# Patient Record
Sex: Female | Born: 1960 | Race: Black or African American | Hispanic: No | Smoking: Never smoker
Health system: Southern US, Community
[De-identification: ages and names within clinical notes are randomized; demographics above are authoritative.]

## PROBLEM LIST (undated history)

## (undated) DIAGNOSIS — J45909 Unspecified asthma, uncomplicated: Secondary | ICD-10-CM

## (undated) DIAGNOSIS — I5020 Unspecified systolic (congestive) heart failure: Secondary | ICD-10-CM

## (undated) DIAGNOSIS — F419 Anxiety disorder, unspecified: Secondary | ICD-10-CM

## (undated) DIAGNOSIS — E785 Hyperlipidemia, unspecified: Secondary | ICD-10-CM

## (undated) DIAGNOSIS — I5022 Chronic systolic (congestive) heart failure: Secondary | ICD-10-CM

## (undated) DIAGNOSIS — I255 Ischemic cardiomyopathy: Secondary | ICD-10-CM

## (undated) DIAGNOSIS — F410 Panic disorder [episodic paroxysmal anxiety] without agoraphobia: Secondary | ICD-10-CM

## (undated) DIAGNOSIS — I5042 Chronic combined systolic (congestive) and diastolic (congestive) heart failure: Secondary | ICD-10-CM

## (undated) DIAGNOSIS — R7303 Prediabetes: Secondary | ICD-10-CM

## (undated) DIAGNOSIS — F32A Depression, unspecified: Secondary | ICD-10-CM

## (undated) DIAGNOSIS — G5600 Carpal tunnel syndrome, unspecified upper limb: Secondary | ICD-10-CM

## (undated) DIAGNOSIS — I340 Nonrheumatic mitral (valve) insufficiency: Secondary | ICD-10-CM

## (undated) DIAGNOSIS — G4733 Obstructive sleep apnea (adult) (pediatric): Secondary | ICD-10-CM

## (undated) DIAGNOSIS — R739 Hyperglycemia, unspecified: Secondary | ICD-10-CM

## (undated) DIAGNOSIS — F329 Major depressive disorder, single episode, unspecified: Secondary | ICD-10-CM

## (undated) DIAGNOSIS — M1990 Unspecified osteoarthritis, unspecified site: Secondary | ICD-10-CM

## (undated) DIAGNOSIS — I10 Essential (primary) hypertension: Secondary | ICD-10-CM

## (undated) DIAGNOSIS — R0600 Dyspnea, unspecified: Secondary | ICD-10-CM

## (undated) DIAGNOSIS — J449 Chronic obstructive pulmonary disease, unspecified: Secondary | ICD-10-CM

## (undated) DIAGNOSIS — G4730 Sleep apnea, unspecified: Secondary | ICD-10-CM

## (undated) DIAGNOSIS — I2510 Atherosclerotic heart disease of native coronary artery without angina pectoris: Secondary | ICD-10-CM

## (undated) DIAGNOSIS — I491 Atrial premature depolarization: Secondary | ICD-10-CM

## (undated) HISTORY — DX: Atherosclerotic heart disease of native coronary artery without angina pectoris: I25.10

## (undated) HISTORY — DX: Ischemic cardiomyopathy: I25.5

## (undated) HISTORY — DX: Nonrheumatic mitral (valve) insufficiency: I34.0

## (undated) HISTORY — DX: Obstructive sleep apnea (adult) (pediatric): G47.33

## (undated) HISTORY — DX: Atrial premature depolarization: I49.1

## (undated) HISTORY — PX: TUBAL LIGATION: SHX77

## (undated) HISTORY — DX: Unspecified systolic (congestive) heart failure: I50.20

## (undated) HISTORY — DX: Prediabetes: R73.03

## (undated) HISTORY — DX: Essential (primary) hypertension: I10

## (undated) HISTORY — DX: Hyperlipidemia, unspecified: E78.5

## (undated) HISTORY — DX: Chronic systolic (congestive) heart failure: I50.22

## (undated) HISTORY — DX: Chronic combined systolic (congestive) and diastolic (congestive) heart failure: I50.42

---

## 2001-11-06 ENCOUNTER — Emergency Department (HOSPITAL_COMMUNITY): Admission: EM | Admit: 2001-11-06 | Discharge: 2001-11-06 | Payer: Self-pay | Admitting: *Deleted

## 2002-04-03 ENCOUNTER — Emergency Department (HOSPITAL_COMMUNITY): Admission: EM | Admit: 2002-04-03 | Discharge: 2002-04-03 | Payer: Self-pay | Admitting: Emergency Medicine

## 2003-09-23 ENCOUNTER — Emergency Department (HOSPITAL_COMMUNITY): Admission: EM | Admit: 2003-09-23 | Discharge: 2003-09-23 | Payer: Self-pay | Admitting: *Deleted

## 2004-02-22 ENCOUNTER — Emergency Department (HOSPITAL_COMMUNITY): Admission: EM | Admit: 2004-02-22 | Discharge: 2004-02-22 | Payer: Self-pay | Admitting: Emergency Medicine

## 2007-06-26 ENCOUNTER — Emergency Department (HOSPITAL_COMMUNITY): Admission: EM | Admit: 2007-06-26 | Discharge: 2007-06-26 | Payer: Self-pay | Admitting: Emergency Medicine

## 2009-06-17 ENCOUNTER — Emergency Department (HOSPITAL_COMMUNITY): Admission: EM | Admit: 2009-06-17 | Discharge: 2009-06-17 | Payer: Self-pay | Admitting: Emergency Medicine

## 2010-09-13 ENCOUNTER — Emergency Department (HOSPITAL_COMMUNITY): Admission: EM | Admit: 2010-09-13 | Discharge: 2010-09-13 | Payer: Self-pay | Admitting: Emergency Medicine

## 2011-02-21 LAB — DIFFERENTIAL
Basophils Absolute: 0.1 10*3/uL (ref 0.0–0.1)
Basophils Relative: 1 % (ref 0–1)
Eosinophils Absolute: 0 10*3/uL (ref 0.0–0.7)
Eosinophils Relative: 1 % (ref 0–5)
Lymphocytes Relative: 28 % (ref 12–46)
Lymphs Abs: 1.9 10*3/uL (ref 0.7–4.0)
Monocytes Absolute: 0.4 10*3/uL (ref 0.1–1.0)
Monocytes Relative: 6 % (ref 3–12)
Neutro Abs: 4.3 10*3/uL (ref 1.7–7.7)
Neutrophils Relative %: 64 % (ref 43–77)

## 2011-02-21 LAB — CBC
HCT: 42.1 % (ref 36.0–46.0)
Hemoglobin: 13.7 g/dL (ref 12.0–15.0)
MCH: 26.6 pg (ref 26.0–34.0)
MCHC: 32.5 g/dL (ref 30.0–36.0)
MCV: 81.9 fL (ref 78.0–100.0)
Platelets: 256 10*3/uL (ref 150–400)
RBC: 5.14 MIL/uL — ABNORMAL HIGH (ref 3.87–5.11)
RDW: 15.5 % (ref 11.5–15.5)
WBC: 6.8 10*3/uL (ref 4.0–10.5)

## 2011-02-21 LAB — BASIC METABOLIC PANEL
BUN: 10 mg/dL (ref 6–23)
CO2: 26 mEq/L (ref 19–32)
Calcium: 9 mg/dL (ref 8.4–10.5)
Chloride: 104 mEq/L (ref 96–112)
Creatinine, Ser: 0.78 mg/dL (ref 0.4–1.2)
GFR calc Af Amer: >60 mL/min (ref >60)
GFR calc non Af Amer: >60 mL/min (ref >60)
Glucose, Bld: 111 mg/dL — ABNORMAL HIGH (ref 70–99)
Potassium: 4 mEq/L (ref 3.5–5.1)
Sodium: 137 mEq/L (ref 135–145)

## 2011-02-21 LAB — POCT CARDIAC MARKERS
CKMB, poc: <1 ng/mL — ABNORMAL LOW (ref 1.0–8.0)
CKMB, poc: <1 ng/mL — ABNORMAL LOW (ref 1.0–8.0)
Myoglobin, poc: 32.2 ng/mL (ref 12–200)
Myoglobin, poc: 47.4 ng/mL (ref 12–200)
Troponin i, poc: <0.05 ng/mL (ref 0.00–0.09)
Troponin i, poc: <0.05 ng/mL (ref 0.00–0.09)

## 2011-02-21 LAB — T4, FREE: Free T4: 1.08 ng/dL (ref 0.80–1.80)

## 2011-02-21 LAB — TSH: TSH: 0.678 u[IU]/mL (ref 0.350–4.500)

## 2011-09-24 LAB — URINALYSIS, ROUTINE W REFLEX MICROSCOPIC
Glucose, UA: NEGATIVE
Hgb urine dipstick: NEGATIVE
Nitrite: NEGATIVE
Protein, ur: NEGATIVE
Urobilinogen, UA: 0.2

## 2011-09-24 LAB — PREGNANCY, URINE: Preg Test, Ur: NEGATIVE

## 2013-06-01 ENCOUNTER — Ambulatory Visit (HOSPITAL_COMMUNITY)
Admission: RE | Admit: 2013-06-01 | Discharge: 2013-06-01 | Disposition: A | Discharge status: Home / Self Care | Payer: Medicaid Other | Source: Ambulatory Visit | Attending: Family Medicine | Admitting: Family Medicine

## 2013-06-01 ENCOUNTER — Other Ambulatory Visit (HOSPITAL_COMMUNITY): Payer: Self-pay | Admitting: Family Medicine

## 2013-06-01 DIAGNOSIS — M25569 Pain in unspecified knee: Secondary | ICD-10-CM | POA: Insufficient documentation

## 2013-06-01 DIAGNOSIS — R52 Pain, unspecified: Secondary | ICD-10-CM

## 2013-06-10 ENCOUNTER — Ambulatory Visit (HOSPITAL_COMMUNITY)
Admission: RE | Admit: 2013-06-10 | Discharge: 2013-06-10 | Disposition: A | Discharge status: Home / Self Care | Payer: Medicaid Other | Source: Ambulatory Visit | Attending: Family Medicine | Admitting: Family Medicine

## 2013-06-10 ENCOUNTER — Other Ambulatory Visit (HOSPITAL_COMMUNITY): Payer: Self-pay | Admitting: Family Medicine

## 2013-06-10 DIAGNOSIS — R0602 Shortness of breath: Secondary | ICD-10-CM | POA: Insufficient documentation

## 2013-06-10 DIAGNOSIS — R609 Edema, unspecified: Secondary | ICD-10-CM

## 2013-06-10 DIAGNOSIS — M79609 Pain in unspecified limb: Secondary | ICD-10-CM | POA: Insufficient documentation

## 2013-07-02 ENCOUNTER — Other Ambulatory Visit (HOSPITAL_COMMUNITY): Payer: Self-pay | Admitting: Family Medicine

## 2013-07-02 ENCOUNTER — Ambulatory Visit (HOSPITAL_COMMUNITY)
Admission: RE | Admit: 2013-07-02 | Discharge: 2013-07-02 | Disposition: A | Discharge status: Home / Self Care | Payer: Disability Insurance | Source: Ambulatory Visit | Attending: Family Medicine | Admitting: Family Medicine

## 2013-07-02 DIAGNOSIS — M5137 Other intervertebral disc degeneration, lumbosacral region: Secondary | ICD-10-CM | POA: Insufficient documentation

## 2013-07-02 DIAGNOSIS — M25562 Pain in left knee: Secondary | ICD-10-CM

## 2013-07-02 DIAGNOSIS — M545 Low back pain, unspecified: Secondary | ICD-10-CM | POA: Insufficient documentation

## 2013-07-02 DIAGNOSIS — M51379 Other intervertebral disc degeneration, lumbosacral region without mention of lumbar back pain or lower extremity pain: Secondary | ICD-10-CM | POA: Insufficient documentation

## 2013-07-02 DIAGNOSIS — M25569 Pain in unspecified knee: Secondary | ICD-10-CM | POA: Insufficient documentation

## 2013-09-16 ENCOUNTER — Other Ambulatory Visit (HOSPITAL_COMMUNITY): Payer: Self-pay | Admitting: Family Medicine

## 2013-09-16 DIAGNOSIS — Z139 Encounter for screening, unspecified: Secondary | ICD-10-CM

## 2013-09-17 ENCOUNTER — Ambulatory Visit (HOSPITAL_COMMUNITY)
Admission: RE | Admit: 2013-09-17 | Discharge: 2013-09-17 | Disposition: A | Discharge status: Home / Self Care | Payer: Medicaid Other | Source: Ambulatory Visit | Attending: Family Medicine | Admitting: Family Medicine

## 2013-09-17 ENCOUNTER — Other Ambulatory Visit (HOSPITAL_COMMUNITY): Payer: Self-pay | Admitting: Family Medicine

## 2013-09-17 DIAGNOSIS — M1710 Unilateral primary osteoarthritis, unspecified knee: Secondary | ICD-10-CM

## 2013-09-17 DIAGNOSIS — M51379 Other intervertebral disc degeneration, lumbosacral region without mention of lumbar back pain or lower extremity pain: Secondary | ICD-10-CM | POA: Insufficient documentation

## 2013-09-17 DIAGNOSIS — M25569 Pain in unspecified knee: Secondary | ICD-10-CM | POA: Insufficient documentation

## 2013-09-17 DIAGNOSIS — M25562 Pain in left knee: Secondary | ICD-10-CM

## 2013-09-17 DIAGNOSIS — R0602 Shortness of breath: Secondary | ICD-10-CM

## 2013-09-17 DIAGNOSIS — M545 Low back pain: Secondary | ICD-10-CM

## 2013-09-17 DIAGNOSIS — M5137 Other intervertebral disc degeneration, lumbosacral region: Secondary | ICD-10-CM | POA: Insufficient documentation

## 2013-09-18 ENCOUNTER — Ambulatory Visit (HOSPITAL_COMMUNITY)
Admission: RE | Admit: 2013-09-18 | Discharge: 2013-09-18 | Disposition: A | Discharge status: Home / Self Care | Payer: Medicaid Other | Source: Ambulatory Visit | Attending: Family Medicine | Admitting: Family Medicine

## 2013-09-18 DIAGNOSIS — Z139 Encounter for screening, unspecified: Secondary | ICD-10-CM

## 2013-09-18 DIAGNOSIS — Z1231 Encounter for screening mammogram for malignant neoplasm of breast: Secondary | ICD-10-CM | POA: Insufficient documentation

## 2013-10-17 ENCOUNTER — Emergency Department (HOSPITAL_COMMUNITY)
Admission: EM | Admit: 2013-10-17 | Discharge: 2013-10-17 | Disposition: A | Discharge status: Home / Self Care | Payer: Medicaid Other | Attending: Emergency Medicine | Admitting: Emergency Medicine

## 2013-10-17 ENCOUNTER — Encounter (HOSPITAL_COMMUNITY): Payer: Self-pay | Admitting: Emergency Medicine

## 2013-10-17 DIAGNOSIS — Z79899 Other long term (current) drug therapy: Secondary | ICD-10-CM | POA: Insufficient documentation

## 2013-10-17 DIAGNOSIS — M129 Arthropathy, unspecified: Secondary | ICD-10-CM | POA: Insufficient documentation

## 2013-10-17 DIAGNOSIS — R220 Localized swelling, mass and lump, head: Secondary | ICD-10-CM

## 2013-10-17 HISTORY — DX: Unspecified osteoarthritis, unspecified site: M19.90

## 2013-10-17 LAB — PRO B NATRIURETIC PEPTIDE: Pro B Natriuretic peptide (BNP): 74.3 pg/mL (ref 0–125)

## 2013-10-17 LAB — HEPATIC FUNCTION PANEL
ALT: 10 U/L (ref 0–35)
Albumin: 3.9 g/dL (ref 3.5–5.2)
Bilirubin, Direct: <0.1 mg/dL (ref 0.0–0.3)
Total Bilirubin: 0.5 mg/dL (ref 0.3–1.2)

## 2013-10-17 LAB — CBC WITH DIFFERENTIAL/PLATELET
Basophils Absolute: 0 10*3/uL (ref 0.0–0.1)
Basophils Relative: 0 % (ref 0–1)
Eosinophils Absolute: 0.1 10*3/uL (ref 0.0–0.7)
HCT: 42.3 % (ref 36.0–46.0)
Hemoglobin: 13.7 g/dL (ref 12.0–15.0)
MCH: 26.8 pg (ref 26.0–34.0)
MCHC: 32.4 g/dL (ref 30.0–36.0)
MCV: 82.6 fL (ref 78.0–100.0)
Monocytes Absolute: 0.4 10*3/uL (ref 0.1–1.0)
Monocytes Relative: 5 % (ref 3–12)
Platelets: 286 10*3/uL (ref 150–400)

## 2013-10-17 LAB — BASIC METABOLIC PANEL
BUN: 13 mg/dL (ref 6–23)
CO2: 28 mEq/L (ref 19–32)
Creatinine, Ser: 0.8 mg/dL (ref 0.50–1.10)
GFR calc Af Amer: >90 mL/min (ref >90)
GFR calc non Af Amer: 85 mL/min — ABNORMAL LOW (ref >90)

## 2013-10-17 NOTE — ED Notes (Addendum)
Patient has had episodes of pre-syncope x 1 year.  Episodes come suddenly and last only 1-2 minutes, but she feels as if she will pass out.  Denies any association w/position changes.  Denies vertigo or any URIs.  Denies disequilibrium, HA, photosensitivity, vision or hearing changes.  States she feels pressure in her face.  Denies any nasal congestion or cough, but has slight tenderness in maxillary sinuses. Denies any renal, adrenal or thyroid problems.  She does have chronic neck pain issues.  She relates that people who've not seen her in a while comment on facial swelling, but she has not noticed it.  No gaze palsy, no nystagmus noted.  She takes 40mg  Lasix daily x 5 months and was started on Prozac 1 month ago.

## 2013-10-17 NOTE — ED Provider Notes (Signed)
CSN: [id number]     Arrival date & time 10/17/13  4098 History   First MD Initiated Contact with Patient 10/17/13 [phone]     Chief Complaint  Patient presents with  . Facial Pain   (Consider location/radiation/quality/duration/timing/severity/associated sxs/prior Treatment) HPI Comments: Patient is a 52 year old African American female who presents to the emergency department with complaint of facial swelling and occasional soreness. The patient states that over the past 2 weeks family members and church members have been telling her that her" face looks swollen". The patient states that she has a sensation that feels like spasm and at times pain of the scalp and the face that last only a few seconds and may recur from time to time. The patient denies any recent injury or breaking the skin to the scalp, or to the face. She denies any toothache. She denies any known sinus infection. She's not had fever or chills. The patient denies any known allergic type reaction. Patient denies any difficulty swallowing. Patient states she has not taken anything for the swelling, she has been seen by her primary physician who did blood work a few weeks ago, states that they did not see anything at the time.  The history is provided by the patient.    Past Medical History  Diagnosis Date  . Arthritis    Past Surgical History  Procedure Laterality Date  . Cesarean section     No family history on file. History  Substance Use Topics  . Smoking status: Never Smoker   . Smokeless tobacco: Not on file  . Alcohol Use: No   OB History   Grav Para Term Preterm Abortions TAB SAB Ect Mult Living                 Review of Systems  Constitutional: Negative for activity change.       All ROS Neg except as noted in HPI  HENT: Positive for facial swelling. Negative for nosebleeds.   Eyes: Negative for photophobia and discharge.  Respiratory: Negative for cough, shortness of breath and wheezing.    Cardiovascular: Negative for chest pain and palpitations.  Gastrointestinal: Negative for abdominal pain and blood in stool.  Genitourinary: Negative for dysuria, frequency and hematuria.  Musculoskeletal: Positive for arthralgias. Negative for back pain and neck pain.  Skin: Negative.   Neurological: Negative for dizziness, seizures and speech difficulty.  Psychiatric/Behavioral: Negative for hallucinations and confusion.    Allergies  Review of patient's allergies indicates no known allergies.  Home Medications   Current Outpatient Rx  Name  Route  Sig  Dispense  Refill  . FLUoxetine (PROZAC) 20 MG capsule   Oral   Take 20 mg by mouth daily.         . Multiple Vitamin (MULTIVITAMIN WITH MINERALS) TABS tablet   Oral   Take 1 tablet by mouth daily.         . naproxen (NAPROSYN) 500 MG tablet   Oral   Take 500 mg by mouth 2 (two) times daily as needed for mild pain.          BP 147/93  Pulse 77  LMP 09/30/2013 Physical Exam  Nursing note and vitals reviewed. Constitutional: She is oriented to person, place, and time. She appears well-developed and well-nourished.  Non-toxic appearance.  HENT:  Head: Normocephalic.  Right Ear: Tympanic membrane and external ear normal.  Left Ear: Tympanic membrane and external ear normal.  Mouth/Throat: Oropharynx is clear and moist.  No oral  abscess noted. No pain to percussion of the sinuses.  Eyes: EOM and lids are normal. Pupils are equal, round, and reactive to light.  Neck: Normal range of motion. Neck supple. Carotid bruit is not present.  Cardiovascular: Normal rate, regular rhythm, normal heart sounds, intact distal pulses and normal pulses.   Pulmonary/Chest: Breath sounds normal. No respiratory distress.  Abdominal: Soft. Bowel sounds are normal. There is no tenderness. There is no guarding.  Musculoskeletal:  There is puffiness of both hands. No hot joints. Mild crepitus of ROM of the wrist and knees. No effusions.   Lymphadenopathy:       Head (right side): No submandibular adenopathy present.       Head (left side): No submandibular adenopathy present.    She has no cervical adenopathy.  Neurological: She is alert and oriented to person, place, and time. She has normal strength. No cranial nerve deficit or sensory deficit.  Skin: Skin is warm and dry.  Psychiatric: She has a normal mood and affect. Her speech is normal.    ED Course  Procedures (including critical care time) Labs Review Labs Reviewed  CBC WITH DIFFERENTIAL - Abnormal; Notable for the following:    RBC 5.12 (*)    All other components within normal limits  BASIC METABOLIC PANEL - Abnormal; Notable for the following:    Glucose, Bld 101 (*)    GFR calc non Af Amer 85 (*)    All other components within normal limits  HEPATIC FUNCTION PANEL - Abnormal; Notable for the following:    Alkaline Phosphatase 127 (*)    All other components within normal limits  PRO B NATRIURETIC PEPTIDE  URINALYSIS, ROUTINE W REFLEX MICROSCOPIC   Imaging Review No results found.  EKG Interpretation   None       MDM  No diagnosis found. *I have reviewed nursing notes, vital signs, and all appropriate lab and imaging results for this patient.** B natruretic peptide 74.3, within normal limits. Complete blood count within normal limits. Basic metabolic panel within normal limits. Hepatic function within normal limits. Patient in no distress. Speaks in complete sentences without any problem whatsoever. There is some mild degenerative changes involving multiple joints and neck. Discussed findings with the patient. Suggest that she follow up with her primary physician for additional evaluation. Doubt any acute problem at this time, no rash, wheezes, nor oral swelling.  the basic metabolic, CBC, and hepatic function are all negative. Feel that it is safe for the patient to be discharged home with outpatient followup. Pt to return if any changes or  problem.  Kathie Dike, PA-C 10/17/13 1250

## 2013-10-17 NOTE — ED Provider Notes (Signed)
Medical screening examination/treatment/procedure(s) were performed by non-physician practitioner and as supervising physician I was immediately available for consultation/collaboration.  EKG Interpretation   None         Kristen N Ward, DO 10/17/13 1604 

## 2013-10-17 NOTE — ED Notes (Signed)
Pt states "soreness" in here head and facial pain. States people have been telling her that her face is swollen. NAD. States dizziness at times when soreness sensation begins in her head. Has seen PMD for this also 2 weeks ago.

## 2013-11-09 ENCOUNTER — Encounter (HOSPITAL_COMMUNITY): Payer: Self-pay | Admitting: Emergency Medicine

## 2013-11-09 DIAGNOSIS — R109 Unspecified abdominal pain: Secondary | ICD-10-CM | POA: Insufficient documentation

## 2013-11-09 DIAGNOSIS — M545 Low back pain, unspecified: Secondary | ICD-10-CM | POA: Insufficient documentation

## 2013-11-09 NOTE — ED Notes (Signed)
Pt c/o lower back and lower abd pain since Sat.

## 2013-11-10 ENCOUNTER — Emergency Department (HOSPITAL_COMMUNITY)
Admission: EM | Admit: 2013-11-10 | Discharge: 2013-11-10 | Discharge status: Against Medical Advice | Payer: Medicaid Other | Attending: Emergency Medicine | Admitting: Emergency Medicine

## 2013-11-10 NOTE — ED Notes (Signed)
Pt was seen leaving the facility in her car.

## 2013-12-10 ENCOUNTER — Emergency Department (HOSPITAL_COMMUNITY): Payer: Medicaid Other

## 2013-12-10 ENCOUNTER — Encounter (HOSPITAL_COMMUNITY): Payer: Self-pay | Admitting: Emergency Medicine

## 2013-12-10 ENCOUNTER — Other Ambulatory Visit: Payer: Self-pay

## 2013-12-10 ENCOUNTER — Observation Stay (HOSPITAL_COMMUNITY)
Admission: EM | Admit: 2013-12-10 | Discharge: 2013-12-12 | Disposition: A | Discharge status: Home / Self Care | Payer: Medicaid Other | Attending: Cardiovascular Disease | Admitting: Cardiovascular Disease

## 2013-12-10 DIAGNOSIS — R0989 Other specified symptoms and signs involving the circulatory and respiratory systems: Secondary | ICD-10-CM

## 2013-12-10 DIAGNOSIS — R002 Palpitations: Principal | ICD-10-CM

## 2013-12-10 DIAGNOSIS — Z8249 Family history of ischemic heart disease and other diseases of the circulatory system: Secondary | ICD-10-CM | POA: Insufficient documentation

## 2013-12-10 DIAGNOSIS — R42 Dizziness and giddiness: Secondary | ICD-10-CM | POA: Diagnosis present

## 2013-12-10 DIAGNOSIS — R0609 Other forms of dyspnea: Secondary | ICD-10-CM | POA: Insufficient documentation

## 2013-12-10 DIAGNOSIS — I10 Essential (primary) hypertension: Secondary | ICD-10-CM | POA: Insufficient documentation

## 2013-12-10 DIAGNOSIS — G4733 Obstructive sleep apnea (adult) (pediatric): Secondary | ICD-10-CM | POA: Insufficient documentation

## 2013-12-10 DIAGNOSIS — E789 Disorder of lipoprotein metabolism, unspecified: Secondary | ICD-10-CM | POA: Insufficient documentation

## 2013-12-10 DIAGNOSIS — R9431 Abnormal electrocardiogram [ECG] [EKG]: Secondary | ICD-10-CM

## 2013-12-10 DIAGNOSIS — M129 Arthropathy, unspecified: Secondary | ICD-10-CM | POA: Insufficient documentation

## 2013-12-10 DIAGNOSIS — R0600 Dyspnea, unspecified: Secondary | ICD-10-CM

## 2013-12-10 DIAGNOSIS — Z6841 Body Mass Index (BMI) 40.0 and over, adult: Secondary | ICD-10-CM | POA: Insufficient documentation

## 2013-12-10 DIAGNOSIS — R7309 Other abnormal glucose: Secondary | ICD-10-CM | POA: Insufficient documentation

## 2013-12-10 HISTORY — DX: Morbid (severe) obesity due to excess calories: E66.01

## 2013-12-10 HISTORY — DX: Depression, unspecified: F32.A

## 2013-12-10 HISTORY — DX: Hyperlipidemia, unspecified: E78.5

## 2013-12-10 HISTORY — DX: Hyperglycemia, unspecified: R73.9

## 2013-12-10 HISTORY — DX: Sleep apnea, unspecified: G47.30

## 2013-12-10 HISTORY — DX: Dyspnea, unspecified: R06.00

## 2013-12-10 HISTORY — DX: Major depressive disorder, single episode, unspecified: F32.9

## 2013-12-10 HISTORY — DX: Essential (primary) hypertension: I10

## 2013-12-10 LAB — CBC WITH DIFFERENTIAL/PLATELET
Basophils Absolute: 0 10*3/uL (ref 0.0–0.1)
Basophils Relative: 0 % (ref 0–1)
Eosinophils Absolute: 0.1 10*3/uL (ref 0.0–0.7)
Eosinophils Relative: 1 % (ref 0–5)
HCT: 42.9 % (ref 36.0–46.0)
Hemoglobin: 13.9 g/dL (ref 12.0–15.0)
LYMPHS ABS: 2.4 10*3/uL (ref 0.7–4.0)
Lymphocytes Relative: 29 % (ref 12–46)
MCH: 26.6 pg (ref 26.0–34.0)
MCHC: 32.4 g/dL (ref 30.0–36.0)
MCV: 82.2 fL (ref 78.0–100.0)
Monocytes Absolute: 0.4 10*3/uL (ref 0.1–1.0)
Monocytes Relative: 5 % (ref 3–12)
Neutro Abs: 5.4 10*3/uL (ref 1.7–7.7)
Neutrophils Relative %: 65 % (ref 43–77)
PLATELETS: 269 10*3/uL (ref 150–400)
RBC: 5.22 MIL/uL — AB (ref 3.87–5.11)
RDW: 14.9 % (ref 11.5–15.5)
WBC: 8.3 10*3/uL (ref 4.0–10.5)

## 2013-12-10 LAB — BASIC METABOLIC PANEL
BUN: 12 mg/dL (ref 6–23)
CALCIUM: 9.6 mg/dL (ref 8.4–10.5)
CO2: 27 meq/L (ref 19–32)
Chloride: 100 mEq/L (ref 96–112)
Creatinine, Ser: 1 mg/dL (ref 0.50–1.10)
GFR calc Af Amer: 75 mL/min — ABNORMAL LOW (ref >90)
GFR calc non Af Amer: 65 mL/min — ABNORMAL LOW (ref >90)
GLUCOSE: 118 mg/dL — AB (ref 70–99)
Potassium: 3.9 mEq/L (ref 3.7–5.3)
Sodium: 142 mEq/L (ref 137–147)

## 2013-12-10 LAB — TROPONIN I: Troponin I: <0.3 ng/mL (ref <0.30)

## 2013-12-10 LAB — D-DIMER, QUANTITATIVE: D-Dimer, Quant: 0.43 ug/mL-FEU (ref 0.00–0.48)

## 2013-12-10 MED ORDER — SODIUM CHLORIDE 0.9 % IV BOLUS (SEPSIS)
1000.0000 mL | Freq: Once | INTRAVENOUS | Status: AC
  Administered 2013-12-10: 1000 mL via INTRAVENOUS

## 2013-12-10 MED ORDER — ACETAMINOPHEN 325 MG PO TABS
650.0000 mg | ORAL_TABLET | Freq: Once | ORAL | Status: AC
  Administered 2013-12-10: 650 mg via ORAL
  Filled 2013-12-10: qty 2

## 2013-12-10 MED ORDER — ASPIRIN 81 MG PO CHEW
324.0000 mg | CHEWABLE_TABLET | Freq: Once | ORAL | Status: AC
  Administered 2013-12-10: 324 mg via ORAL
  Filled 2013-12-10: qty 4

## 2013-12-10 NOTE — ED Provider Notes (Signed)
CSN: [id number]     Arrival date & time 12/10/13  1806 History   First MD Initiated Contact with Patient 12/10/13 1814     Chief Complaint  Patient presents with  . Palpitations  . Shortness of Breath   (Consider location/radiation/quality/duration/timing/severity/associated sxs/prior Treatment) HPI Comments: 53 yo female with depression hx presents with palpitations and sob since cooking today. Pt has had brief episodes in the past but today she had sudden palpitations and sob, multiple episodes since 2 pm that are lasting longer, she feels like she is going to pass out.  No known heart or blood clot hx.  Patient denies blood clot history, active cancer, recent major trauma or surgery, unilateral leg swelling/ pain, recent long travel, hemoptysis or oral contraceptives.  Pt has mild sob at times with exertion.  No htn, chol, smoking or DM.  Improved since.  No arrhythmia hx.    Patient is a 53 y.o. female presenting with palpitations and shortness of breath. The history is provided by the patient.  Palpitations Associated symptoms: shortness of breath   Associated symptoms: no back pain, no chest pain, no diaphoresis and no vomiting   Shortness of Breath Associated symptoms: no abdominal pain, no chest pain, no diaphoresis, no fever, no headaches, no neck pain, no rash and no vomiting     Past Medical History  Diagnosis Date  . Arthritis   . Hypertension   . Depression    Past Surgical History  Procedure Laterality Date  . Cesarean section     History reviewed. No pertinent family history. History  Substance Use Topics  . Smoking status: Never Smoker   . Smokeless tobacco: Not on file  . Alcohol Use: No   OB History   Grav Para Term Preterm Abortions TAB SAB Ect Mult Living                 Review of Systems  Constitutional: Negative for fever, chills and diaphoresis.  HENT: Negative for congestion.   Eyes: Negative for visual disturbance.  Respiratory: Positive for  shortness of breath.   Cardiovascular: Positive for palpitations. Negative for chest pain and leg swelling.  Gastrointestinal: Negative for vomiting and abdominal pain.  Genitourinary: Negative for dysuria and flank pain.  Musculoskeletal: Negative for back pain, neck pain and neck stiffness.  Skin: Negative for rash.  Neurological: Positive for light-headedness. Negative for syncope and headaches.    Allergies  Review of patient's allergies indicates no known allergies.  Home Medications   Current Outpatient Rx  Name  Route  Sig  Dispense  Refill  . FLUoxetine (PROZAC) 20 MG capsule   Oral   Take 20 mg by mouth daily.         . furosemide (LASIX) 40 MG tablet   Oral   Take 40 mg by mouth daily.         . Multiple Vitamin (MULTIVITAMIN WITH MINERALS) TABS tablet   Oral   Take 1 tablet by mouth daily.         . naproxen (NAPROSYN) 500 MG tablet   Oral   Take 500 mg by mouth 2 (two) times daily as needed for mild pain.          BP 138/97  Pulse 101  Temp(Src) 98.1 F (36.7 C) (Oral)  Resp 26  Ht 5\' 8"  (1.727 m)  Wt 273 lb (123.832 kg)  BMI 41.52 kg/m2  SpO2 96%  LMP 11/26/2013 Physical Exam  Nursing note and vitals reviewed.  Constitutional: She is oriented to person, place, and time. She appears well-developed and well-nourished.  HENT:  Head: Normocephalic and atraumatic.  Eyes: Conjunctivae are normal. Right eye exhibits no discharge. Left eye exhibits no discharge.  Neck: Normal range of motion. Neck supple. No tracheal deviation present.  Cardiovascular: Regular rhythm.  Tachycardia present.   Pulmonary/Chest: Effort normal and breath sounds normal.  Abdominal: Soft. She exhibits no distension (obese). There is no tenderness. There is no guarding.  Musculoskeletal: She exhibits no edema and no tenderness.  Neurological: She is alert and oriented to person, place, and time.  Skin: Skin is warm. No rash noted.  Psychiatric: She has a normal mood and  affect.    ED Course  Procedures (including critical care time) Labs Review Labs Reviewed  BASIC METABOLIC PANEL - Abnormal; Notable for the following:    Glucose, Bld 118 (*)    GFR calc non Af Amer 65 (*)    GFR calc Af Amer 75 (*)    All other components within normal limits  CBC WITH DIFFERENTIAL - Abnormal; Notable for the following:    RBC 5.22 (*)    All other components within normal limits  TROPONIN I  D-DIMER, QUANTITATIVE   Imaging Review Dg Chest 2 View  12/10/2013   CLINICAL DATA:  Palpitations.  Shortness of breath.  EXAM: CHEST  2 VIEW  COMPARISON:  09/17/2013  FINDINGS: Heart size remains at the upper limits of normal. No evidence of congestive heart failure or pleural effusion. A nodular density is now seen in the left upper lobe in proximity to pain EKG lead. This could be artifactual although a pulmonary nodule cannot be excluded.  IMPRESSION: No acute findings.  Left upper lobe nodular density, which could potentially be related to an EKG lead in this region. Recommend repeat PA chest radiograph after moving this EKG lead.   Electronically Signed   By: Myles Rosenthal M.D.   On: 12/10/2013 19:45    EKG Interpretation   None     EKG not in muse  Date: 12/10/2013  Rate: 102  Rhythm: sinus tachycardia  QRS Axis: normal  Intervals: normal  ST/T Wave abnormalities: nonspecific T wave changes inf and lateral T wave inversions  Conduction Disutrbances:none  Narrative Interpretation:   Old EKG Reviewed: none available    MDM   1. Abnormal EKG   2. Dyspnea   3. Palpitations    Non toxic appearing. Palpitations with dyspnea. EKG multiple T wave inversions, no old. D dimer ordered as pt low risk PE.  Spoke with cardiology on call, Dr Cyndee Brightly accepted tele obs at [facility] Pt improved on recheck, repeat ekg ordered.   The patients results and plan were reviewed and discussed.   Any x-rays performed were personally reviewed by myself.   Differential  diagnosis were considered with the presenting HPI.  Diagnosis: above  EKG: reviewed  Admission/ observation were discussed with the admitting physician, patient and/or family and they are comfortable with the plan.      Enid Skeens, MD 12/10/13 2034

## 2013-12-10 NOTE — ED Notes (Signed)
Patient states "I was cooking and started feeling my heart racing, I got short of breath, and felt like I was going to pass out." Denies chest pain.

## 2013-12-11 ENCOUNTER — Observation Stay (HOSPITAL_COMMUNITY): Payer: Medicaid Other

## 2013-12-11 DIAGNOSIS — R0609 Other forms of dyspnea: Secondary | ICD-10-CM

## 2013-12-11 DIAGNOSIS — R42 Dizziness and giddiness: Secondary | ICD-10-CM | POA: Diagnosis present

## 2013-12-11 DIAGNOSIS — R079 Chest pain, unspecified: Secondary | ICD-10-CM

## 2013-12-11 DIAGNOSIS — R0989 Other specified symptoms and signs involving the circulatory and respiratory systems: Secondary | ICD-10-CM

## 2013-12-11 DIAGNOSIS — R002 Palpitations: Secondary | ICD-10-CM | POA: Diagnosis present

## 2013-12-11 DIAGNOSIS — R0600 Dyspnea, unspecified: Secondary | ICD-10-CM | POA: Diagnosis present

## 2013-12-11 LAB — TROPONIN I

## 2013-12-11 LAB — LIPID PANEL
CHOL/HDL RATIO: 6.8 ratio
CHOLESTEROL: 203 mg/dL — AB (ref 0–200)
HDL: 30 mg/dL — ABNORMAL LOW (ref >39)
LDL Cholesterol: 146 mg/dL — ABNORMAL HIGH (ref 0–99)
TRIGLYCERIDES: 133 mg/dL (ref <150)
VLDL: 27 mg/dL (ref 0–40)

## 2013-12-11 LAB — TSH: TSH: 0.675 u[IU]/mL (ref 0.350–4.500)

## 2013-12-11 LAB — PRO B NATRIURETIC PEPTIDE: Pro B Natriuretic peptide (BNP): 24.5 pg/mL (ref 0–125)

## 2013-12-11 LAB — HEMOGLOBIN A1C
Hgb A1c MFr Bld: 6 % — ABNORMAL HIGH (ref <5.7)
Mean Plasma Glucose: 126 mg/dL — ABNORMAL HIGH (ref <117)

## 2013-12-11 MED ORDER — GI COCKTAIL ~~LOC~~
30.0000 mL | Freq: Four times a day (QID) | ORAL | Status: DC | PRN
  Filled 2013-12-11: qty 30

## 2013-12-11 MED ORDER — POTASSIUM CHLORIDE ER 10 MEQ PO TBCR
40.0000 meq | EXTENDED_RELEASE_TABLET | Freq: Once | ORAL | Status: AC
  Administered 2013-12-11: 40 meq via ORAL
  Filled 2013-12-11: qty 4

## 2013-12-11 MED ORDER — FLUOXETINE HCL 20 MG PO CAPS
20.0000 mg | ORAL_CAPSULE | Freq: Every day | ORAL | Status: DC
  Administered 2013-12-11 – 2013-12-12 (×2): 20 mg via ORAL
  Filled 2013-12-11 (×2): qty 1

## 2013-12-11 MED ORDER — MAGNESIUM OXIDE 400 (241.3 MG) MG PO TABS
800.0000 mg | ORAL_TABLET | Freq: Once | ORAL | Status: AC
  Administered 2013-12-11: 800 mg via ORAL
  Filled 2013-12-11: qty 2

## 2013-12-11 MED ORDER — HYPROMELLOSE (GONIOSCOPIC) 2.5 % OP SOLN: 1.0000 [drp] | Freq: Every day | OPHTHALMIC | Status: DC | PRN

## 2013-12-11 MED ORDER — REGADENOSON 0.4 MG/5ML IV SOLN
INTRAVENOUS | Status: AC
  Administered 2013-12-11: 0.4 mg via INTRAVENOUS
  Filled 2013-12-11: qty 5

## 2013-12-11 MED ORDER — REGADENOSON 0.4 MG/5ML IV SOLN
0.4000 mg | Freq: Once | INTRAVENOUS | Status: AC
  Administered 2013-12-11: 0.4 mg via INTRAVENOUS
  Filled 2013-12-11: qty 5

## 2013-12-11 MED ORDER — FUROSEMIDE 10 MG/ML IJ SOLN: 20.0000 mg | Freq: Once | INTRAMUSCULAR | Status: DC

## 2013-12-11 MED ORDER — ENOXAPARIN SODIUM 40 MG/0.4ML ~~LOC~~ SOLN
40.0000 mg | Freq: Every day | SUBCUTANEOUS | Status: DC
  Filled 2013-12-11 (×2): qty 0.4

## 2013-12-11 MED ORDER — FUROSEMIDE 10 MG/ML IJ SOLN
40.0000 mg | Freq: Once | INTRAMUSCULAR | Status: AC
  Administered 2013-12-11: 40 mg via INTRAVENOUS

## 2013-12-11 MED ORDER — TECHNETIUM TC 99M SESTAMIBI - CARDIOLITE
30.0000 | Freq: Once | INTRAVENOUS | Status: AC | PRN
  Administered 2013-12-11: 30 via INTRAVENOUS

## 2013-12-11 MED ORDER — ACETAMINOPHEN 325 MG PO TABS: 650.0000 mg | ORAL_TABLET | ORAL | Status: DC | PRN

## 2013-12-11 MED ORDER — TECHNETIUM TC 99M SESTAMIBI GENERIC - CARDIOLITE: 10.0000 | Freq: Once | INTRAVENOUS | Status: AC | PRN

## 2013-12-11 MED ORDER — ASPIRIN EC 81 MG PO TBEC
81.0000 mg | DELAYED_RELEASE_TABLET | Freq: Every day | ORAL | Status: DC
  Administered 2013-12-12: 81 mg via ORAL
  Filled 2013-12-11 (×2): qty 1

## 2013-12-11 MED ORDER — FUROSEMIDE 10 MG/ML IJ SOLN
INTRAMUSCULAR | Status: AC
  Filled 2013-12-11: qty 4

## 2013-12-11 MED ORDER — ONDANSETRON HCL 4 MG/2ML IJ SOLN: 4.0000 mg | Freq: Four times a day (QID) | INTRAMUSCULAR | Status: DC | PRN

## 2013-12-11 NOTE — Progress Notes (Signed)
Patient ID: Annette Bryant, female   DOB: 1961/05/31, 53 y.o.   MRN: [id number]    Subjective:  Denies SSCP, palpitations or Dyspnea   Objective:  Filed Vitals:   12/10/13 2000 12/10/13 2100 12/10/13 2256 12/11/13 0415  BP: 131/82 146/83 145/92 124/72  Pulse: 72 79 83 75  Temp:   98 F (36.7 C) 97.7 F (36.5 C)  TempSrc:   Oral Oral  Resp: 17 22 20 19   Height:   5\' 8"  (1.727 m)   Weight:   273 lb 5.9 oz (124 kg)   SpO2: 96% 96% 96% 93%    Intake/Output from previous day:  Intake/Output Summary (Last 24 hours) at 12/11/13 0827 Last data filed at 12/11/13 0415  Gross per 24 hour  Intake      0 ml  Output   1600 ml  Net  -1600 ml    Physical Exam: Affect appropriate Obese black female  HEENT: normal Neck supple with no adenopathy JVP normal no bruits no thyromegaly Lungs clear with no wheezing and good diaphragmatic motion Heart:  S1/S2 no murmur, no rub, gallop or click PMI normal Abdomen: benighn, BS positve, no tenderness, no AAA no bruit.  No HSM or HJR Distal pulses intact with no bruits No edema Neuro non-focal Skin warm and dry No muscular weakness   Lab Results: Basic Metabolic Panel:  Recent Labs  46/96/2899/12/24 1832  NA 142  K 3.9  CL 100  CO2 27  GLUCOSE 118*  BUN 12  CREATININE 1.00  CALCIUM 9.6   CBC:  Recent Labs  12/10/13 1832  WBC 8.3  NEUTROABS 5.4  HGB 13.9  HCT 42.9  MCV 82.2  PLT 269   Cardiac Enzymes:  Recent Labs  12/10/13 1832 12/11/13 0200  TROPONINI <0.30 <0.30   D-Dimer:  Recent Labs  12/10/13 1832  DDIMER 0.43   Fasting Lipid Panel:  Recent Labs  12/11/13 0200  CHOL 203*  HDL 30*  LDLCALC 146*  TRIG 133  CHOLHDL 6.8    Imaging: Dg Chest 2 View  12/10/2013   CLINICAL DATA:  Palpitations.  Shortness of breath.  EXAM: CHEST  2 VIEW  COMPARISON:  09/17/2013  FINDINGS: Heart size remains at the upper limits of normal. No evidence of congestive heart failure or pleural effusion. A nodular density is  now seen in the left upper lobe in proximity to pain EKG lead. This could be artifactual although a pulmonary nodule cannot be excluded.  IMPRESSION: No acute findings.  Left upper lobe nodular density, which could potentially be related to an EKG lead in this region. Recommend repeat PA chest radiograph after moving this EKG lead.   Electronically Signed   By: Annette Bryant  Stahl M.D.   On: 12/10/2013 19:45   Dg Chest Port 1 View  12/10/2013   CLINICAL DATA:  Chest pain and dizziness. Repeat study following removal of ECG lead to determine if there is a left upper lobe nodule present.  EXAM: PORTABLE CHEST - 1 VIEW  COMPARISON:  Chest x-ray 12/10/2013.  FINDINGS: Previously noted nodular opacity projecting over the upper left hemithorax is no longer identified following removal of the ECG leads. Pulmonary venous congestion, without frank pulmonary edema. No consolidative airspace disease. No pleural effusions. Heart size is within normal limits. The patient is rotated to the left on today's exam, resulting in distortion of the mediastinal contours and reduced diagnostic sensitivity and specificity for mediastinal pathology.  IMPRESSION: 1. No pulmonary nodule identified. The finding  on the prior study was presumably artifactual secondary to the overlying ECG leads. 2. Pulmonary venous congestion, without frank pulmonary edema.   Electronically Signed   By: Annette Bryant M.D.   On: 12/10/2013 21:17    Cardiac Studies:  ECG: SR rate 68 nonspecific St/T wave change s   Telemetry:  NSR no arrhythmia   Echo: pending   Medications:   . aspirin EC  81 mg Oral Daily  . enoxaparin (LOVENOX) injection  40 mg Subcutaneous Daily  . FLUoxetine  20 mg Oral Daily  . furosemide           Assessment/Plan:  Palpitations:  Telemetry has been unrevealing  Normal intervals on ECG continue to monitor Dyspnea:  CXR with ? Venous congestion BNP normal and D dimer negative  ? Functional from obesity.  Echo today Chest  Pain Doubt acute cardiac syndrome  ASA  2 day myovue ordered by fellow.  Will have stress portion today  Anticipate d/c tomorrow if tests ok   [name] 12/11/2013, 8:27 AM

## 2013-12-11 NOTE — Progress Notes (Signed)
Echocardiogram 2D Echocardiogram has been performed.  12/11/2013 1:57 PM Gertie FeyMichelle Lashaya Kienitz, RVT, RDCS, RDMS

## 2013-12-11 NOTE — H&P (Signed)
PCP:   Milana Obey, MD   Chief Complaint:  Palpitations, dyspnea, dizziness  HPI: This 53 year old African American morbidly obese female with past medical history of borderline hypertension as well as. He strong family history of early cardiovascular disease (sister with MI at age of 45 and the other sister with coronary stents at the age of 28) who presented with palpitations, dizziness, dyspnea. Patient reported that she had episodes of palpitations associated with shortness of breath sensation that point is [place] to her head for the last year or so. Usually happens after she perform some activities like cleaning the house. It usually takes her about 2-3 minutes before she gets sensation of heart racing and dyspnea, then it goes away after a few minutes of rest. On the day of presentation patient was making the bed and developed palpitations that was associate with shortness of breath. The plus about 5 minutes. Patient laid down and rested for about 2 hours.  She then went to the kitchen to prepare food and developed another episode of heart racing was associate with shortness of breath and significant dizziness while she was doing this. Patient reported that this episode lasted for about 5 minutes as well and resolved after she stopped her activity. She reported that she was dizzy to the point which was about to pass out. She had one more episode of significant dizziness at the time of presentation to the emergency department.  Patient denied active symptoms of chest pain, PND orthopnea, nausea vomiting, chills or fevers, bowel or bladder disturbances, bleeding problems. Patient reported that her blood pressure in the high occasionally usually it runs around 140/90 when she checks it with her primary care physician, she was prescribed Lasix in July 2014 which she takes intermittently for lower extremity edema which is primarily on her left leg. Patient admitted frequent urination during the  night up to 6 times, poor sleep, she feels tired all the time. She reported that somebody noticed her stop breathing while sleeping about 6 months ago  Review of Systems:  12 systems were reviewed and were negative except mentioned in the history of present illness.  Past Medical History: Past Medical History  Diagnosis Date  . Arthritis   . Hypertension   . Depression   . Sleep apnea     by clinical history   Past Surgical History  Procedure Laterality Date  . Cesarean section      Medications: Prior to Admission medications   Medication Sig Start Date End Date Taking? Authorizing Provider  FLUoxetine (PROZAC) 20 MG capsule Take 20 mg by mouth daily.   Yes Historical Provider, MD  furosemide (LASIX) 40 MG tablet Take 40 mg by mouth daily.   Yes Historical Provider, MD  hydroxypropyl methylcellulose (ISOPTO TEARS) 2.5 % ophthalmic solution Place 1 drop into both eyes daily as needed for dry eyes.   Yes Historical Provider, MD  naproxen (NAPROSYN) 500 MG tablet Take 500 mg by mouth 2 (two) times daily as needed for mild pain.   Yes Historical Provider, MD    Allergies:  No Known Allergies  Social History:  reports that she has never smoked. She does not have any smokeless tobacco history on file. She reports that she does not drink alcohol or use illicit drugs.   Family History: Family History  Problem Relation Age of Onset  . Coronary artery disease Sister 40    stents  . Coronary artery disease Sister   . Heart attack Sister 4  CHF, MI    PHYSICAL EXAM:  Filed Vitals:   12/10/13 1900 12/10/13 2000 12/10/13 2100 12/10/13 2256  BP: 125/81 131/82 146/83 145/92  Pulse: 79 72 79 83  Temp:    98 F (36.7 C)  TempSrc:    Oral  Resp: 21 17 22 20   Height:    5\' 8"  (1.727 m)  Weight:    124 kg (273 lb 5.9 oz)  SpO2: 93% 96% 96% 96%   General:  Well appearing. Morbidly obese, No respiratory difficulty HEENT: normal Neck: supple. JVP just above the level of the  clavicle with patient seated at 90. Carotids 2+ bilat; no bruits. No lymphadenopathy or thryomegaly appreciated. Cor: PMI nondisplaced. Regular rate & rhythm. Soft systolic murmur, best heard at the apex Lungs: clear to auscultation bilaterally, no wheezings, crackles Abdomen: soft, nontender, nondistended. No hepatosplenomegaly. No bruits or masses. Good bowel sounds. Extremities: no cyanosis, clubbing, rash, edema Neuro: alert & oriented x 3, cranial nerves grossly intact. moves all 4 extremities w/o difficulty. Affect pleasant.  Labs on Admission:   Recent Labs  12/10/13 1832  NA 142  K 3.9  CL 100  CO2 27  GLUCOSE 118*  BUN 12  CREATININE 1.00  CALCIUM 9.6   No results found for this basename: AST, ALT, ALKPHOS, BILITOT, PROT, ALBUMIN,  in the last 72 hours No results found for this basename: LIPASE, AMYLASE,  in the last 72 hours  Recent Labs  12/10/13 1832  WBC 8.3  NEUTROABS 5.4  HGB 13.9  HCT 42.9  MCV 82.2  PLT 269    Recent Labs  12/10/13 1832  TROPONINI <0.30   No results found for this basename: TSH, T4TOTAL, FREET3, T3FREE, THYROIDAB,  in the last 72 hours No results found for this basename: VITAMINB12, FOLATE, FERRITIN, TIBC, IRON, RETICCTPCT,  in the last 72 hours  Radiological Exams on Admission (all images were personally reviewed and interpreted by me, radiology reports were reviewed as well): Dg Chest 2 View  12/10/2013   CLINICAL DATA:  Palpitations.  Shortness of breath.  EXAM: CHEST  2 VIEW  COMPARISON:  09/17/2013  FINDINGS: Heart size remains at the upper limits of normal. No evidence of congestive heart failure or pleural effusion. A nodular density is now seen in the left upper lobe in proximity to pain EKG lead. This could be artifactual although a pulmonary nodule cannot be excluded.  IMPRESSION: No acute findings.  Left upper lobe nodular density, which could potentially be related to an EKG lead in this region. Recommend repeat PA chest  radiograph after moving this EKG lead.   Electronically Signed   By: Myles RosenthalJohn  Stahl M.D.   On: 12/10/2013 19:45   Dg Chest Port 1 View 12/10/2013   CLINICAL DATA:  Chest pain and dizziness. Repeat study following removal of ECG lead to determine if there is a left upper lobe nodule present.  EXAM: PORTABLE CHEST - 1 VIEW  COMPARISON:  Chest x-ray 12/10/2013.  FINDINGS: Previously noted nodular opacity projecting over the upper left hemithorax is no longer identified following removal of the ECG leads. Pulmonary venous congestion, without frank pulmonary edema. No consolidative airspace disease. No pleural effusions. Heart size is within normal limits. The patient is rotated to the left on today's exam, resulting in distortion of the mediastinal contours and reduced diagnostic sensitivity and specificity for mediastinal pathology.  IMPRESSION: 1. No pulmonary nodule identified. The finding on the prior study was presumably artifactual secondary to the overlying ECG  leads. 2. Pulmonary venous congestion, without frank pulmonary edema.   Electronically Signed   By: Trudie Reed M.D.   On: 12/10/2013 21:17   EKG personally reviewed and interpreted by me: Sinus tachycardia 102 beats per minute, occasional PACs, T wave inversions in II, III, aVF, V3-V6    Assessment/Plan  53 year old [place] American female with strong family history of premature coronary artery disease, morbid obesity, borderline hypertension who presented with 2 episodes of palpitations associated with shortness of breath and dizziness.  Given the exertional nature of her symptoms there has been going on for about a year and strong family history it is reasonable to proceed with ischemic evaluation. Given the presence of pulmonary venous congestion, intermittent lower extremity edema, T wave inversions on electrocardiogram we'll obtain echocardiogram.   Problems: Dyspnea on exertion Palpitations Dizziness  Plan: TSH, proBNP, 2 sets of  troponin, lipid profile, hemoglobin A1c Observe on telemetry Aspirin 81 mg daily Lasix 40 mg IV x1, potassium 20 mEq by mouth x1, magnesium oxide 800 mg times one by mouth. Strict ins and outs, daily weights. Echocardiogram likely with contrast given body habitus Myocardial SPECT - for ischemic evaluation, ideally patient will need attenuation correction with CT. The order can be changed in the morning     GI prophylaxis: Not Required DVT prophylaxis Lovenox 40 mg [place] daily Full code  Annette Bryant 12/11/2013, 12:04 AM

## 2013-12-12 ENCOUNTER — Encounter (HOSPITAL_COMMUNITY): Payer: Self-pay | Admitting: Physician Assistant

## 2013-12-12 DIAGNOSIS — E785 Hyperlipidemia, unspecified: Secondary | ICD-10-CM | POA: Insufficient documentation

## 2013-12-12 DIAGNOSIS — R0609 Other forms of dyspnea: Secondary | ICD-10-CM

## 2013-12-12 DIAGNOSIS — F32A Depression, unspecified: Secondary | ICD-10-CM | POA: Insufficient documentation

## 2013-12-12 DIAGNOSIS — Z9989 Dependence on other enabling machines and devices: Secondary | ICD-10-CM | POA: Insufficient documentation

## 2013-12-12 DIAGNOSIS — G4730 Sleep apnea, unspecified: Secondary | ICD-10-CM | POA: Insufficient documentation

## 2013-12-12 DIAGNOSIS — I10 Essential (primary) hypertension: Secondary | ICD-10-CM | POA: Insufficient documentation

## 2013-12-12 DIAGNOSIS — R739 Hyperglycemia, unspecified: Secondary | ICD-10-CM | POA: Insufficient documentation

## 2013-12-12 DIAGNOSIS — R002 Palpitations: Principal | ICD-10-CM

## 2013-12-12 DIAGNOSIS — G4733 Obstructive sleep apnea (adult) (pediatric): Secondary | ICD-10-CM | POA: Insufficient documentation

## 2013-12-12 DIAGNOSIS — F329 Major depressive disorder, single episode, unspecified: Secondary | ICD-10-CM | POA: Insufficient documentation

## 2013-12-12 DIAGNOSIS — R0989 Other specified symptoms and signs involving the circulatory and respiratory systems: Secondary | ICD-10-CM

## 2013-12-12 NOTE — Discharge Summary (Signed)
Discharge Summary   Patient ID: Annette Bryant MRN: [id number], DOB/AGE: 51-Apr-1962 51 y.o. Admit date: 12/10/2013 D/C date:     12/12/2013  Primary Care Provider: Milana Obey, MD Primary Cardiologist: Eden Emms  Primary Discharge Diagnoses:  1. Dyspnea with palpitations - ruled out for MI, d-dimer normal, normal stress test, normal echo, no evidence of inpatient arrhythmia - consider outpatient event monitor if symptoms recur 2. HTN 3. OSA 4. Morbid obesity Body mass index is 41.37 kg/(m^2). 5. Hyperglycemia 6. Elevated lipids  Secondary Discharge Diagnoses:  1. Arthritis 2. Depression  Hospital Course: Annette Bryant is a 53 y/o F with history of borderline HTN, OSA, morbid obesity, and family history of CAD who presented to [facility] with palpitations, dizziness, and dyspnea. She has had episodes of palpitations associated with SOB for the last year or two. Usually it happens after she perform some activities like cleaning the house. It usually takes her about 2-3 minutes before she gets sensation of heart racing and dyspnea, then it goes away after a few minutes of rest. On the day of presentation, the patient was making the bed and developed palpitations and shortness of breath. This lasted about 5 minutes. She laid down and rested for about 2 hours. She then went to the kitchen and developed another episode of heart racing, SOB, and significant dizziness while preparing food. She denied active symptoms of chest pain, PND, orthopnea, nausea, vomiting, chills or fevers, bowel or bladder disturbances, or bleeding problems. She was admitted to the hospital for further evaluation. EKG showed sinus tachycardia 102 beats per minute, occasional PACs, T wave inversions in II, III, aVF, V3-V6. She was initially treated with a dose of IV Lasix due to pulmonary venous congestion. pBNP later returned normal. It was felt perhaps the x-ray finding was functional from her obesity. (Of note -  first CXR described a possible LUL nodular density later deemed to be artifact by followup CXR that did not show said nodule.) Troponins remained negative, BMET and CBC were grossly normal except CBG 126, and pBNP/d-dimer were normal. TSH was normal. Telemetry was unrevealing. She underwent a stress-images-only Lexiscan cardiolite that was normal with EF 60%. 2D Echo was of poor quality but showed normal EF, no RWMA, and normal diastolic function parameters. Today the patient feels well. Dr. Diona Browner recommends f/u with her primary care provider. If she continues to manifest symptoms, consideration could be given to a longer-term outpatient cardiac monitor to exclude arrhythmias. She was also advised to f/u PCP for management of her mildly elevated blood sugar and cholesterol results. No new medications were added this admission. Dr. Diona Browner has seen and examined the patient today and feels she is stable for discharge.   Discharge Vitals: Blood pressure 120/75, pulse 79, temperature 98.1 F (36.7 C), temperature source Oral, resp. rate 19, height 5\' 8"  (1.727 m), weight 272 lb 0.8 oz (123.4 kg), last menstrual period 11/26/2013, SpO2 97.00%.  Labs: Lab Results  Component Value Date   WBC 8.3 12/10/2013   HGB 13.9 12/10/2013   HCT 42.9 12/10/2013   MCV 82.2 12/10/2013   PLT 269 12/10/2013     Recent Labs Lab 12/10/13 1832  NA 142  K 3.9  CL 100  CO2 27  BUN 12  CREATININE 1.00  CALCIUM 9.6  GLUCOSE 118*    Recent Labs  12/10/13 1832 12/11/13 0200  TROPONINI <0.30 <0.30   Lab Results  Component Value Date   CHOL 203* 12/11/2013   HDL  30* 12/11/2013   LDLCALC 146* 12/11/2013   TRIG 133 12/11/2013   Lab Results  Component Value Date   DDIMER 0.43 12/10/2013    Diagnostic Studies/Procedures   Dg Chest 2 View  12/10/2013   CLINICAL DATA:  Palpitations.  Shortness of breath.  EXAM: CHEST  2 VIEW  COMPARISON:  09/17/2013  FINDINGS: Heart size remains at the upper limits of normal. No evidence  of congestive heart failure or pleural effusion. A nodular density is now seen in the left upper lobe in proximity to pain EKG lead. This could be artifactual although a pulmonary nodule cannot be excluded.  IMPRESSION: No acute findings.  Left upper lobe nodular density, which could potentially be related to an EKG lead in this region. Recommend repeat PA chest radiograph after moving this EKG lead.   Electronically Signed   By: Myles Rosenthal M.D.   On: 12/10/2013 19:45   Nm Myocar Single W/spect W/wall Motion And Ef  12/11/2013   CLINICAL DATA:  Chest pain  EXAM: Lexiscan Myovue  TECHNIQUE: The patient received IV Lexiscan .4mg  over 15 seconds. 33.0 mCi of Technetium 61m Sestamibi injected at 30 seconds. Quantitative SPECT images were obtained in the vertical, horizontal and short axis planes No resting images were needed  ECG:  SR nonspecific ST/T wave changes  Symptoms:  No chest pain or dyspnea  RAW Data:  Motion artifact and breast  QPS:  No resting images  Quantitative Gated SPECT EF:  60% normal no RWMA's  Perfusion Images: Stress only images were normal. There was mild attenuation of the distal anterior apex that disappeared on gray scale images and were most consistent with breast artifact  IMPRESSION: Normal stress only lexiscan myovue. EF 60% No rest images will be needed  [name]   Electronically Signed   By: [name] M.D.   On: 12/11/2013 13:52   Dg Chest Port 1 View  12/10/2013   CLINICAL DATA:  Chest pain and dizziness. Repeat study following removal of ECG lead to determine if there is a left upper lobe nodule present.  EXAM: PORTABLE CHEST - 1 VIEW  COMPARISON:  Chest x-ray 12/10/2013.  FINDINGS: Previously noted nodular opacity projecting over the upper left hemithorax is no longer identified following removal of the ECG leads. Pulmonary venous congestion, without frank pulmonary edema. No consolidative airspace disease. No pleural effusions. Heart size is within normal limits. The  patient is rotated to the left on today's exam, resulting in distortion of the mediastinal contours and reduced diagnostic sensitivity and specificity for mediastinal pathology.  IMPRESSION: 1. No pulmonary nodule identified. The finding on the prior study was presumably artifactual secondary to the overlying ECG leads. 2. Pulmonary venous congestion, without frank pulmonary edema.   Electronically Signed   By: Trudie Reed M.D.   On: 12/10/2013 21:17    Discharge Medications     Medication List         FLUoxetine 20 MG capsule  Commonly known as:  PROZAC  Take 20 mg by mouth daily.     furosemide 40 MG tablet  Commonly known as:  LASIX  Take 40 mg by mouth daily.     hydroxypropyl methylcellulose 2.5 % ophthalmic solution  Commonly known as:  ISOPTO TEARS  Place 1 drop into both eyes daily as needed for dry eyes.     naproxen 500 MG tablet  Commonly known as:  NAPROSYN  Take 500 mg by mouth 2 (two) times daily as needed for  mild pain.        Disposition   The patient will be discharged in stable condition to home. Discharge Orders   Future Orders Complete By Expires   Diet - low sodium heart healthy  As directed    Discharge instructions  As directed    Comments:     Your blood sugar and cholesterol were somewhat elevated. Please follow up with your primary care doctor for this.   Increase activity slowly  As directed    Comments:     You may return to work on 12/14/13 if you are feeling well.     Follow-up Information   Schedule an appointment as soon as possible for a visit with Milana ObeyKNOWLTON,STEPHEN D, MD. (If your heart palpitations happen again, your doctor may want to consider an outpatient heart monitor.)    Specialty:  Family Medicine   Contact information:   [address] [place] [address] [phone]       Follow up with Charlton HawsPeter Nishan, MD. (You don't necessarily need to follow up with a heart doctor for now, but we are available if you  need us.)    Specialty:  Cardiology   Contact information:   [address] [place] [address] [phone]         Duration of Discharge Encounter: Greater than 30 minutes including physician and PA time.  Signed, Dayna Dunn PA-C 12/12/2013, 1:45 PM

## 2013-12-12 NOTE — Progress Notes (Signed)
Utilization Review completed.  

## 2013-12-12 NOTE — Progress Notes (Signed)
Discharged to home with family office visits in place teaching done  

## 2013-12-12 NOTE — Discharge Summary (Signed)
Please see also rounding note. 

## 2013-12-12 NOTE — Progress Notes (Signed)
Primary cardiologist: Dr. Charlton HawsPeter Nishan  Subjective:    No chest pain or shortness of breath, no palpitations.  Objective:   Temp:  [98.2 F (36.8 C)-98.5 F (36.9 C)] 98.3 F (36.8 C) (01/03 0522) Pulse Rate:  [85-87] 85 (01/03 0522) Resp:  [20] 20 (01/03 0522) BP: (112-148)/(47-94) 126/76 mmHg (01/03 0522) SpO2:  [98 %-100 %] 100 % (01/03 0522) Weight:  [272 lb 0.8 oz (123.4 kg)] 272 lb 0.8 oz (123.4 kg) (01/03 0522)    Filed Weights   12/10/13 1816 12/10/13 2256 12/12/13 0522  Weight: 273 lb (123.832 kg) 273 lb 5.9 oz (124 kg) 272 lb 0.8 oz (123.4 kg)   No intake or output data in the 24 hours ending 12/12/13 0935  Telemetry: Sinus rhythm, no arrhythmias.  Exam:  General: No distress.  Lungs: Clear, nonlabored.  Cardiac: RRR, no gallop.  Extremities: No pitting.   Lab Results:  Basic Metabolic Panel:  Recent Labs Lab 12/10/13 1832  NA 142  K 3.9  CL 100  CO2 27  GLUCOSE 118*  BUN 12  CREATININE 1.00  CALCIUM 9.6    CBC:  Recent Labs Lab 12/10/13 1832  WBC 8.3  HGB 13.9  HCT 42.9  MCV 82.2  PLT 269    Cardiac Enzymes:  Recent Labs Lab 12/10/13 1832 12/11/13 0200  TROPONINI <0.30 <0.30    BNP:  Recent Labs  10/17/13 1029 12/11/13 0200  PROBNP 74.3 24.5    Echocardiogram: Study Conclusions  Left ventricle: The cavity size was normal. Systolic function was normal. The estimated ejection fraction was in the range of 55% to 65%. Wall motion was normal; there were no regional wall motion abnormalities. Left ventricular diastolic function parameters were normal.  Impressions:  - Poor quality study but appears normal.  Imaging: EXAM:  Lexiscan Myovue  TECHNIQUE:  The patient received IV Lexiscan .4mg  over 15 seconds. 33.0 mCi of  Technetium 2345m Sestamibi injected at 30 seconds. Quantitative SPECT  images were obtained in the vertical, horizontal and short axis  planes No resting images were needed  ECG: SR  nonspecific ST/T wave changes  Symptoms: No chest pain or dyspnea  RAW Data: Motion artifact and breast  QPS: No resting images  Quantitative Gated SPECT EF: 60% normal no RWMA's  Perfusion Images: Stress only images were normal. There was mild  attenuation of the distal anterior apex that disappeared on gray  scale images and were most consistent with breast artifact  IMPRESSION:  Normal stress only lexiscan myovue. EF 60% No rest images will be  needed    Medications:   Scheduled Medications: . aspirin EC  81 mg Oral Daily  . enoxaparin (LOVENOX) injection  40 mg Subcutaneous Daily  . FLUoxetine  20 mg Oral Daily      PRN Medications:  acetaminophen, gi cocktail, hydroxypropyl methylcellulose, ondansetron (ZOFRAN) IV   Assessment:   1. Chest pain associated with palpitations and shortness of breath. No evidence of ACS by enzymes. Workup shows no arrhythmia by telemetry, no ischemia by Myoview, and normal LVEF by echocardiogram.  2. Hypertension, blood pressure stable. Takes Lasix at home. Primary care physician is Dr. Sudie BaileyKnowlton.  3. Obstructive sleep apnea.   Plan/Discussion:    Discharge home today. She should follow up with Dr. Sudie BaileyKnowlton. If she continues to manifest symptoms, consideration could be given to a longer-term outpatient cardiac monitor to exclude arrhythmias. No new medications being added at this time.   Annette SidleSamuel G. McDowell, M.D., F.A.C.C.

## 2013-12-17 ENCOUNTER — Ambulatory Visit (INDEPENDENT_AMBULATORY_CARE_PROVIDER_SITE_OTHER): Payer: Medicaid Other | Admitting: Psychiatry

## 2013-12-17 ENCOUNTER — Encounter (HOSPITAL_COMMUNITY): Payer: Self-pay | Admitting: Psychiatry

## 2013-12-17 VITALS — BP 140/90 | Ht 68.0 in | Wt 276.0 lb

## 2013-12-17 DIAGNOSIS — F329 Major depressive disorder, single episode, unspecified: Secondary | ICD-10-CM

## 2013-12-17 DIAGNOSIS — F332 Major depressive disorder, recurrent severe without psychotic features: Secondary | ICD-10-CM

## 2013-12-17 DIAGNOSIS — F32A Depression, unspecified: Secondary | ICD-10-CM

## 2013-12-17 DIAGNOSIS — F4310 Post-traumatic stress disorder, unspecified: Secondary | ICD-10-CM

## 2013-12-17 MED ORDER — CLONAZEPAM 0.5 MG PO TABS: 0.5000 mg | ORAL_TABLET | Freq: Two times a day (BID) | ORAL | Status: DC | PRN

## 2013-12-17 MED ORDER — DULOXETINE HCL 60 MG PO CPEP: 60.0000 mg | ORAL_CAPSULE | Freq: Every day | ORAL | Status: DC
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
## 2014-01-11 ENCOUNTER — Encounter (HOSPITAL_COMMUNITY): Payer: Self-pay | Admitting: Psychiatry

## 2014-01-11 ENCOUNTER — Ambulatory Visit (INDEPENDENT_AMBULATORY_CARE_PROVIDER_SITE_OTHER): Payer: 59 | Admitting: Psychiatry

## 2014-01-11 VITALS — BP 160/98 | Ht 68.0 in | Wt 269.0 lb

## 2014-01-11 DIAGNOSIS — F332 Major depressive disorder, recurrent severe without psychotic features: Secondary | ICD-10-CM

## 2014-01-11 DIAGNOSIS — F329 Major depressive disorder, single episode, unspecified: Secondary | ICD-10-CM

## 2014-01-11 DIAGNOSIS — F32A Depression, unspecified: Secondary | ICD-10-CM

## 2014-01-11 DIAGNOSIS — F4310 Post-traumatic stress disorder, unspecified: Secondary | ICD-10-CM

## 2014-01-11 MED ORDER — DULOXETINE HCL 60 MG PO CPEP: 60.0000 mg | ORAL_CAPSULE | Freq: Two times a day (BID) | ORAL | Status: DC
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
## 2014-01-28 ENCOUNTER — Ambulatory Visit (INDEPENDENT_AMBULATORY_CARE_PROVIDER_SITE_OTHER): Payer: 59 | Admitting: Psychiatry

## 2014-01-28 DIAGNOSIS — F339 Major depressive disorder, recurrent, unspecified: Secondary | ICD-10-CM

## 2014-01-28 DIAGNOSIS — F4310 Post-traumatic stress disorder, unspecified: Secondary | ICD-10-CM

## 2014-01-29 ENCOUNTER — Encounter (HOSPITAL_COMMUNITY): Payer: Self-pay | Admitting: Psychiatry

## 2014-01-29 NOTE — Patient Instructions (Signed)
Discussed orally 
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
## 2014-02-08 ENCOUNTER — Ambulatory Visit (INDEPENDENT_AMBULATORY_CARE_PROVIDER_SITE_OTHER): Payer: 59 | Admitting: Psychiatry

## 2014-02-08 ENCOUNTER — Encounter (HOSPITAL_COMMUNITY): Payer: Self-pay | Admitting: Psychiatry

## 2014-02-08 VITALS — BP 130/88 | Ht 68.0 in | Wt 270.0 lb

## 2014-02-08 DIAGNOSIS — F339 Major depressive disorder, recurrent, unspecified: Secondary | ICD-10-CM

## 2014-02-08 DIAGNOSIS — F332 Major depressive disorder, recurrent severe without psychotic features: Secondary | ICD-10-CM

## 2014-02-08 DIAGNOSIS — F4310 Post-traumatic stress disorder, unspecified: Secondary | ICD-10-CM

## 2014-02-08 MED ORDER — CLONAZEPAM 0.5 MG PO TABS: 0.5000 mg | ORAL_TABLET | Freq: Two times a day (BID) | ORAL | Status: DC | PRN
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
## 2014-02-17 ENCOUNTER — Ambulatory Visit (INDEPENDENT_AMBULATORY_CARE_PROVIDER_SITE_OTHER): Payer: 59 | Admitting: Psychiatry

## 2014-02-17 DIAGNOSIS — F4310 Post-traumatic stress disorder, unspecified: Secondary | ICD-10-CM

## 2014-02-17 DIAGNOSIS — F339 Major depressive disorder, recurrent, unspecified: Secondary | ICD-10-CM

## 2014-02-17 NOTE — Patient Instructions (Signed)
Discussed orally 
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
## 2014-03-10 ENCOUNTER — Ambulatory Visit (HOSPITAL_COMMUNITY): Payer: Self-pay | Admitting: Psychiatry

## 2014-03-31 ENCOUNTER — Ambulatory Visit (INDEPENDENT_AMBULATORY_CARE_PROVIDER_SITE_OTHER): Payer: 59 | Admitting: Psychiatry

## 2014-03-31 DIAGNOSIS — F4310 Post-traumatic stress disorder, unspecified: Secondary | ICD-10-CM

## 2014-03-31 DIAGNOSIS — F339 Major depressive disorder, recurrent, unspecified: Secondary | ICD-10-CM

## 2014-04-01 NOTE — Patient Instructions (Signed)
Discussed orally 
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
## 2014-04-09 ENCOUNTER — Encounter (HOSPITAL_COMMUNITY): Payer: Self-pay | Admitting: Psychiatry

## 2014-04-09 ENCOUNTER — Ambulatory Visit (INDEPENDENT_AMBULATORY_CARE_PROVIDER_SITE_OTHER): Payer: MEDICAID | Admitting: Psychiatry

## 2014-04-09 VITALS — BP 140/80 | Ht 68.0 in | Wt 268.0 lb

## 2014-04-09 DIAGNOSIS — F332 Major depressive disorder, recurrent severe without psychotic features: Secondary | ICD-10-CM

## 2014-04-09 DIAGNOSIS — F4310 Post-traumatic stress disorder, unspecified: Secondary | ICD-10-CM

## 2014-04-09 DIAGNOSIS — F339 Major depressive disorder, recurrent, unspecified: Secondary | ICD-10-CM

## 2014-04-09 MED ORDER — GABAPENTIN 100 MG PO CAPS: 100.0000 mg | ORAL_CAPSULE | Freq: Three times a day (TID) | ORAL | Status: DC

## 2014-04-09 MED ORDER — DULOXETINE HCL 60 MG PO CPEP: 60.0000 mg | ORAL_CAPSULE | Freq: Two times a day (BID) | ORAL | Status: DC

## 2014-04-09 MED ORDER — CLONAZEPAM 0.5 MG PO TABS: 0.5000 mg | ORAL_TABLET | Freq: Two times a day (BID) | ORAL | Status: DC | PRN
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
## 2014-04-28 ENCOUNTER — Ambulatory Visit (HOSPITAL_COMMUNITY): Payer: Self-pay | Admitting: Psychiatry

## 2014-05-11 ENCOUNTER — Ambulatory Visit (INDEPENDENT_AMBULATORY_CARE_PROVIDER_SITE_OTHER): Payer: 59 | Admitting: Psychiatry

## 2014-05-11 DIAGNOSIS — F339 Major depressive disorder, recurrent, unspecified: Secondary | ICD-10-CM

## 2014-05-11 DIAGNOSIS — F4310 Post-traumatic stress disorder, unspecified: Secondary | ICD-10-CM
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
## 2014-05-11 NOTE — Patient Instructions (Signed)
Discussed orally 

## 2014-05-14 ENCOUNTER — Other Ambulatory Visit (HOSPITAL_COMMUNITY): Payer: Self-pay | Admitting: Psychiatry

## 2014-05-14 ENCOUNTER — Ambulatory Visit (INDEPENDENT_AMBULATORY_CARE_PROVIDER_SITE_OTHER): Payer: 59 | Admitting: Psychiatry

## 2014-05-14 ENCOUNTER — Encounter (HOSPITAL_COMMUNITY): Payer: Self-pay | Admitting: Psychiatry

## 2014-05-14 ENCOUNTER — Telehealth (HOSPITAL_COMMUNITY): Payer: Self-pay | Admitting: *Deleted

## 2014-05-14 VITALS — BP 110/73 | Ht 68.0 in | Wt 263.0 lb

## 2014-05-14 DIAGNOSIS — F332 Major depressive disorder, recurrent severe without psychotic features: Secondary | ICD-10-CM

## 2014-05-14 DIAGNOSIS — F4310 Post-traumatic stress disorder, unspecified: Secondary | ICD-10-CM

## 2014-05-14 DIAGNOSIS — F339 Major depressive disorder, recurrent, unspecified: Secondary | ICD-10-CM

## 2014-05-14 MED ORDER — DULOXETINE HCL 60 MG PO CPEP: ORAL_CAPSULE | ORAL | Status: DC

## 2014-05-14 MED ORDER — GABAPENTIN 100 MG PO CAPS: 100.0000 mg | ORAL_CAPSULE | Freq: Three times a day (TID) | ORAL | Status: DC

## 2014-05-14 MED ORDER — BUPROPION HCL ER (XL) 150 MG PO TB24: 150.0000 mg | ORAL_TABLET | ORAL | Status: DC
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
## 2014-05-14 NOTE — Telephone Encounter (Signed)
Sent again

## 2014-05-17 ENCOUNTER — Other Ambulatory Visit (HOSPITAL_COMMUNITY): Payer: Self-pay | Admitting: Psychiatry

## 2014-05-17 MED ORDER — DULOXETINE HCL 60 MG PO CPEP: ORAL_CAPSULE | ORAL | Status: DC

## 2014-05-19 ENCOUNTER — Ambulatory Visit (HOSPITAL_COMMUNITY)
Admission: RE | Admit: 2014-05-19 | Discharge: 2014-05-19 | Disposition: A | Discharge status: Home / Self Care | Payer: Medicaid Other | Source: Ambulatory Visit | Attending: Family Medicine | Admitting: Family Medicine

## 2014-05-19 ENCOUNTER — Other Ambulatory Visit (HOSPITAL_COMMUNITY): Payer: Self-pay | Admitting: Family Medicine

## 2014-05-19 DIAGNOSIS — M546 Pain in thoracic spine: Secondary | ICD-10-CM

## 2014-05-19 DIAGNOSIS — Q767 Congenital malformation of sternum: Secondary | ICD-10-CM | POA: Insufficient documentation

## 2014-05-19 DIAGNOSIS — M25519 Pain in unspecified shoulder: Secondary | ICD-10-CM | POA: Insufficient documentation

## 2014-05-19 DIAGNOSIS — Q766 Other congenital malformations of ribs: Secondary | ICD-10-CM | POA: Insufficient documentation

## 2014-05-19 DIAGNOSIS — M542 Cervicalgia: Secondary | ICD-10-CM | POA: Insufficient documentation

## 2014-05-28 ENCOUNTER — Ambulatory Visit (INDEPENDENT_AMBULATORY_CARE_PROVIDER_SITE_OTHER): Payer: 59 | Admitting: Psychiatry

## 2014-05-28 DIAGNOSIS — F332 Major depressive disorder, recurrent severe without psychotic features: Secondary | ICD-10-CM

## 2014-05-28 NOTE — Patient Instructions (Signed)
Discussed orally 
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
## 2014-06-09 ENCOUNTER — Ambulatory Visit (HOSPITAL_COMMUNITY): Payer: Self-pay | Admitting: Psychiatry

## 2014-06-10 ENCOUNTER — Encounter (HOSPITAL_COMMUNITY): Payer: Self-pay | Admitting: Psychiatry

## 2014-06-10 ENCOUNTER — Ambulatory Visit (INDEPENDENT_AMBULATORY_CARE_PROVIDER_SITE_OTHER): Payer: 59 | Admitting: Psychiatry

## 2014-06-10 ENCOUNTER — Ambulatory Visit (HOSPITAL_COMMUNITY): Payer: Self-pay | Admitting: Psychiatry

## 2014-06-10 VITALS — BP 130/94 | Ht 68.0 in | Wt 263.0 lb

## 2014-06-10 DIAGNOSIS — O99345 Other mental disorders complicating the puerperium: Secondary | ICD-10-CM

## 2014-06-10 DIAGNOSIS — F4310 Post-traumatic stress disorder, unspecified: Secondary | ICD-10-CM

## 2014-06-10 DIAGNOSIS — F332 Major depressive disorder, recurrent severe without psychotic features: Secondary | ICD-10-CM

## 2014-06-10 MED ORDER — DULOXETINE HCL 60 MG PO CPEP: ORAL_CAPSULE | ORAL | Status: DC

## 2014-06-10 MED ORDER — CLONAZEPAM 0.5 MG PO TABS: 0.5000 mg | ORAL_TABLET | Freq: Two times a day (BID) | ORAL | Status: DC | PRN

## 2014-06-10 MED ORDER — GABAPENTIN 100 MG PO CAPS: 100.0000 mg | ORAL_CAPSULE | Freq: Three times a day (TID) | ORAL | Status: DC
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
## 2014-06-24 ENCOUNTER — Other Ambulatory Visit (HOSPITAL_COMMUNITY): Payer: Self-pay

## 2014-06-24 DIAGNOSIS — G4730 Sleep apnea, unspecified: Secondary | ICD-10-CM

## 2014-07-06 ENCOUNTER — Ambulatory Visit: Payer: Medicaid Other | Attending: Neurology | Admitting: Sleep Medicine

## 2014-07-06 VITALS — Ht 68.0 in | Wt 262.0 lb

## 2014-07-06 DIAGNOSIS — R5381 Other malaise: Secondary | ICD-10-CM | POA: Insufficient documentation

## 2014-07-06 DIAGNOSIS — R5383 Other fatigue: Secondary | ICD-10-CM

## 2014-07-06 DIAGNOSIS — G4730 Sleep apnea, unspecified: Secondary | ICD-10-CM

## 2014-07-06 DIAGNOSIS — G4710 Hypersomnia, unspecified: Secondary | ICD-10-CM | POA: Diagnosis present

## 2014-07-06 DIAGNOSIS — G4733 Obstructive sleep apnea (adult) (pediatric): Secondary | ICD-10-CM | POA: Insufficient documentation

## 2014-07-09 ENCOUNTER — Ambulatory Visit (INDEPENDENT_AMBULATORY_CARE_PROVIDER_SITE_OTHER): Payer: 59 | Admitting: Psychiatry

## 2014-07-09 DIAGNOSIS — F332 Major depressive disorder, recurrent severe without psychotic features: Secondary | ICD-10-CM

## 2014-07-09 DIAGNOSIS — F4310 Post-traumatic stress disorder, unspecified: Secondary | ICD-10-CM
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
## 2014-07-09 NOTE — Patient Instructions (Signed)
Discussed orally 

## 2014-07-11 NOTE — Sleep Study (Signed)
  [facility] Eiliana Drone A. Gerilyn Pilgrimoonquah, MD     [email or web address]        NOCTURNAL POLYSOMNOGRAM    LOCATION: SLEEP LAB FACILITY: [facility]   PHYSICIAN: Dwayna Kentner A. Gerilyn Pilgrimoonquah, M.D.   DATE OF STUDY: 07/06/2014.   REFERRING PHYSICIAN: Josie Mesa.  INDICATIONS: The patient is a 53 year old presents with hypersomnia, snoring and fatigue.  MEDICATIONS:  Prior to Admission medications   Medication Sig Start Date End Date Taking? Authorizing Provider  clonazePAM (KLONOPIN) 0.5 MG tablet Take 1 tablet (0.5 mg total) by mouth 2 (two) times daily as needed for anxiety. 06/10/14 06/10/15  Diannia Rudereborah Ross, MD  DULoxetine (CYMBALTA) 60 MG capsule Take one at bedtime 06/10/14   Diannia Rudereborah Ross, MD  furosemide (LASIX) 40 MG tablet Take 40 mg by mouth daily.    Historical Provider, MD  gabapentin (NEURONTIN) 100 MG capsule Take 1 capsule (100 mg total) by mouth 3 (three) times daily. 06/10/14 06/10/15  Diannia Rudereborah Ross, MD  HYDROcodone-acetaminophen (NORCO) 7.5-325 MG per tablet Take 1 tablet by mouth every 6 (six) hours as needed for moderate pain.    Historical Provider, MD  hydroxypropyl methylcellulose (ISOPTO TEARS) 2.5 % ophthalmic solution Place 1 drop into both eyes daily as needed for dry eyes.    Historical Provider, MD  naproxen (NAPROSYN) 500 MG tablet Take 500 mg by mouth 2 (two) times daily as needed for mild pain.    Historical Provider, MD      EPWORTH SLEEPINESS SCALE: 19.   BMI: 41.   ARCHITECTURAL SUMMARY: Total recording time was 443 minutes. Sleep efficiency 29 %. Sleep latency 109 minutes. REM latency 211 minutes. Stage NI 20 %, N2 68 % and N3 0 % and REM sleep 12 %.    RESPIRATORY DATA:  Baseline oxygen saturation is 98 %. The lowest saturation is 82 %. The diagnostic AHI is 70. The RDI is 70. The REM AHI is 54. The patient could not be tolerated because of poor sleep efficiency and the events occurring during the last hours of recording.  LIMB MOVEMENT SUMMARY: PLM index 0.    ELECTROCARDIOGRAM SUMMARY: Average heart rate is 73 with no significant dysrhythmias observed.   IMPRESSION:  1. Severe obstructive sleep apnea syndrome. The patient could not be titrated during his of poor sleep efficiency and her events occurring in the last hour of the recording. 2. Markedly abnormal sleep architecture with poor sleep efficiency and absent slow-wave sleep.  Thanks for this referral.  [place] Doig A. Gerilyn Pilgrimoonquah, M.D. Diplomat, Biomedical engineerAmerican Board of Sleep Medicine.

## 2014-08-11 ENCOUNTER — Ambulatory Visit (INDEPENDENT_AMBULATORY_CARE_PROVIDER_SITE_OTHER): Payer: 59 | Admitting: Psychiatry

## 2014-08-11 ENCOUNTER — Encounter (HOSPITAL_COMMUNITY): Payer: Self-pay | Admitting: Psychiatry

## 2014-08-11 VITALS — BP 129/76 | HR 84 | Ht 68.0 in | Wt 267.0 lb

## 2014-08-11 DIAGNOSIS — F4310 Post-traumatic stress disorder, unspecified: Secondary | ICD-10-CM

## 2014-08-11 DIAGNOSIS — F332 Major depressive disorder, recurrent severe without psychotic features: Secondary | ICD-10-CM

## 2014-08-11 MED ORDER — DULOXETINE HCL 60 MG PO CPEP: ORAL_CAPSULE | ORAL | Status: DC

## 2014-08-11 MED ORDER — CLONAZEPAM 0.5 MG PO TABS: 0.5000 mg | ORAL_TABLET | Freq: Two times a day (BID) | ORAL | Status: DC | PRN

## 2014-08-11 MED ORDER — GABAPENTIN 100 MG PO CAPS: 100.0000 mg | ORAL_CAPSULE | Freq: Three times a day (TID) | ORAL | Status: DC
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
## 2014-08-12 ENCOUNTER — Telehealth (HOSPITAL_COMMUNITY): Payer: Self-pay | Admitting: *Deleted

## 2014-08-12 NOTE — Telephone Encounter (Signed)
Pt pharmacy is requesting Prior Auth for pt Duloxetine HCL 60 mg. Called prior auth to start the process and per Angelia and she stated the Brand name for this medication is covered by his insurance but the generic is not covered and to call pt pharmacy and have them run the medication through as Brand name. Called pharmacy and spoke with pharmacist and per pharmacist, she will work on that.

## 2014-08-19 ENCOUNTER — Other Ambulatory Visit (HOSPITAL_COMMUNITY): Payer: Self-pay | Admitting: Family Medicine

## 2014-08-19 DIAGNOSIS — Z1231 Encounter for screening mammogram for malignant neoplasm of breast: Secondary | ICD-10-CM

## 2014-08-20 ENCOUNTER — Ambulatory Visit (INDEPENDENT_AMBULATORY_CARE_PROVIDER_SITE_OTHER): Payer: 59 | Admitting: Psychiatry

## 2014-08-20 DIAGNOSIS — F333 Major depressive disorder, recurrent, severe with psychotic symptoms: Secondary | ICD-10-CM

## 2014-08-20 DIAGNOSIS — F4310 Post-traumatic stress disorder, unspecified: Secondary | ICD-10-CM

## 2014-08-20 NOTE — Patient Instructions (Signed)
Discussed orally 
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
## 2014-08-23 ENCOUNTER — Encounter (HOSPITAL_COMMUNITY): Payer: Self-pay | Admitting: Emergency Medicine

## 2014-08-23 ENCOUNTER — Emergency Department (HOSPITAL_COMMUNITY)
Admission: EM | Admit: 2014-08-23 | Discharge: 2014-08-24 | Disposition: A | Discharge status: Home / Self Care | Payer: Medicaid Other | Attending: Emergency Medicine | Admitting: Emergency Medicine

## 2014-08-23 DIAGNOSIS — I10 Essential (primary) hypertension: Secondary | ICD-10-CM | POA: Diagnosis not present

## 2014-08-23 DIAGNOSIS — F3289 Other specified depressive episodes: Secondary | ICD-10-CM | POA: Insufficient documentation

## 2014-08-23 DIAGNOSIS — W1809XA Striking against other object with subsequent fall, initial encounter: Secondary | ICD-10-CM | POA: Insufficient documentation

## 2014-08-23 DIAGNOSIS — Y990 Civilian activity done for income or pay: Secondary | ICD-10-CM | POA: Diagnosis not present

## 2014-08-23 DIAGNOSIS — W07XXXA Fall from chair, initial encounter: Secondary | ICD-10-CM | POA: Diagnosis not present

## 2014-08-23 DIAGNOSIS — M129 Arthropathy, unspecified: Secondary | ICD-10-CM | POA: Diagnosis not present

## 2014-08-23 DIAGNOSIS — S0993XA Unspecified injury of face, initial encounter: Secondary | ICD-10-CM | POA: Diagnosis not present

## 2014-08-23 DIAGNOSIS — M542 Cervicalgia: Secondary | ICD-10-CM

## 2014-08-23 DIAGNOSIS — S199XXA Unspecified injury of neck, initial encounter: Principal | ICD-10-CM

## 2014-08-23 DIAGNOSIS — Y9289 Other specified places as the place of occurrence of the external cause: Secondary | ICD-10-CM | POA: Diagnosis not present

## 2014-08-23 DIAGNOSIS — F329 Major depressive disorder, single episode, unspecified: Secondary | ICD-10-CM | POA: Diagnosis not present

## 2014-08-23 NOTE — ED Notes (Signed)
Pain in post neck, onset yesterday s/p falling backwards after chair broke.  Struck head on brick wall, No LOC.

## 2014-08-23 NOTE — ED Provider Notes (Addendum)
CSN: [id number]     Arrival date & time 08/23/14  2258 History   First MD Initiated Contact with Patient 08/23/14 2339     This chart was scribed for Donnetta Hutching, MD by Tonye Royalty, ED Scribe. This patient was seen in room APA07/APA07 and the patient's care was started at 11:52 PM.   Chief Complaint  Patient presents with  . Neck Pain   The history is provided by the patient. No language interpreter was used.   HPI Comments: Annette Bryant is a 53 y.o. female who presents to the Emergency Department complaining of posterior neck pain after leaning back in a chair and falling and striking a brick wall with the occipital part of her skull. She denies loss of consciousness. No specific head pain. No other injuries. No neurological deficits. Severity is mild to moderate. No radicular symptoms  Past Medical History  Diagnosis Date  . Arthritis   . Hypertension   . Depression   . Sleep apnea     by clinical history  . Dyspnea     a. Adm 12/2013 for dyspnea/palps: normal stress-pics-only Lexiscan nuc, echo unremarkable with normal EF, d-dimer normal, ruled out for MI.  Marland Kitchen Hyperglycemia   . Elevated lipids   . Morbid obesity    Past Surgical History  Procedure Laterality Date  . Cesarean section    . Tubal ligation     Family History  Problem Relation Age of Onset  . Coronary artery disease Sister 40    stents  . Depression Sister   . Coronary artery disease Sister   . Heart attack Sister 80    CHF, MI  . Depression Mother   . Alcohol abuse Father   . Schizophrenia Brother   . Alcohol abuse Brother    History  Substance Use Topics  . Smoking status: Never Smoker   . Smokeless tobacco: Not on file  . Alcohol Use: No   OB History   Grav Para Term Preterm Abortions TAB SAB Ect Mult Living                 Review of Systems A complete 10 system review of systems was obtained and all systems are negative except as noted in the HPI and PMH.    Allergies  Review of patient's  allergies indicates no known allergies.  Home Medications   Prior to Admission medications   Medication Sig Start Date End Date Taking? Authorizing Provider  clonazePAM (KLONOPIN) 0.5 MG tablet Take 1 tablet (0.5 mg total) by mouth 2 (two) times daily as needed for anxiety. 08/11/14 08/11/15  Diannia Ruder, MD  cyclobenzaprine (FLEXERIL) 10 MG tablet Take 1 tablet (10 mg total) by mouth 2 (two) times daily as needed for muscle spasms. 08/24/14   Donnetta Hutching, MD  DULoxetine (CYMBALTA) 60 MG capsule Take one at bedtime 08/11/14   Diannia Ruder, MD  furosemide (LASIX) 40 MG tablet Take 40 mg by mouth daily.    Historical Provider, MD  gabapentin (NEURONTIN) 100 MG capsule Take 1 capsule (100 mg total) by mouth 3 (three) times daily. 08/11/14 08/11/15  Diannia Ruder, MD  HYDROcodone-acetaminophen (NORCO) 7.5-325 MG per tablet Take 1 tablet by mouth every 6 (six) hours as needed for moderate pain.    Historical Provider, MD  naproxen (NAPROSYN) 500 MG tablet Take 500 mg by mouth 2 (two) times daily as needed for mild pain.    Historical Provider, MD  naproxen (NAPROSYN) 500 MG tablet Take 1  tablet (500 mg total) by mouth 2 (two) times daily. 08/24/14   Donnetta Hutching, MD   BP 128/85  Pulse 89  Temp(Src) 98.1 F (36.7 C) (Oral)  Resp 18  Ht 5' 7.5" (1.715 m)  Wt 264 lb (119.75 kg)  BMI 40.71 kg/m2  SpO2 98%  LMP 06/26/2014 Physical Exam  Nursing note and vitals reviewed. Constitutional: She is oriented to person, place, and time. She appears well-developed and well-nourished.  HENT:  Head: Normocephalic and atraumatic.  Eyes: Conjunctivae are normal.  Neck: Normal range of motion. Neck supple.  Pulmonary/Chest: Effort normal.  Musculoskeletal: Normal range of motion.  Neurological: She is alert and oriented to person, place, and time. No cranial nerve deficit. She exhibits normal muscle tone. Coordination normal.  No neuro deficits  Skin: Skin is warm and dry.  Psychiatric: She has a normal mood and  affect.    ED Course  Procedures (including critical care time) Labs Review Labs Reviewed - No data to display  Imaging Review Ct Cervical Spine Wo Contrast  08/24/2014   CLINICAL DATA:  Status post fall out of chair. Hit occiput on brick wall. Neck pain.  EXAM: CT CERVICAL SPINE WITHOUT CONTRAST  TECHNIQUE: Multidetector CT imaging of the cervical spine was performed without intravenous contrast. Multiplanar CT image reconstructions were also generated.  COMPARISON:  Cervical spine radiographs performed 05/19/2014  FINDINGS: There is no evidence of fracture or subluxation. Vertebral bodies demonstrate normal height and alignment. Intervertebral disc spaces are preserved. Prevertebral soft tissues are within normal limits. The visualized neural foramina are grossly unremarkable. Anterior bridging osteophytes are seen along the cervical spine.  The thyroid gland is unremarkable in appearance. The visualized lung apices are clear. Prominent cerumen is noted filling the right external auditory canal, with mild cerumen in the left external auditory canal.  IMPRESSION: 1. No evidence of fracture or subluxation along the cervical spine. 2. Prominent cerumen noted filling the right external auditory canal, with mild cerumen in the left external auditory canal.   Electronically Signed   By: Roanna Raider M.D.   On: 08/24/2014 00:55     EKG Interpretation None     DIAGNOSTIC STUDIES: Oxygen Saturation is 98% on room air, normal by my interpretation.    COORDINATION OF CARE: Will order a CT of her neck; because she is overweight, it is hard to evaluate her properly otherwise.   MDM   Final diagnoses:  Neck pain   Patient is alert without neurological deficits. CT of cervical spine shows no acute injury. Discharge medications Naprosyn 500 mg and Flexeril 10 mg  I personally performed the services described in this documentation, which was scribed in my presence. The recorded information has been  reviewed and is accurate.    Donnetta Hutching, MD 08/24/14 [id number]  Donnetta Hutching, MD 08/24/14 [phone]

## 2014-08-24 ENCOUNTER — Emergency Department (HOSPITAL_COMMUNITY): Payer: Medicaid Other

## 2014-08-24 MED ORDER — NAPROXEN 500 MG PO TABS: 500.0000 mg | ORAL_TABLET | Freq: Two times a day (BID) | ORAL | Status: DC

## 2014-08-24 MED ORDER — OXYCODONE-ACETAMINOPHEN 5-325 MG PO TABS
1.0000 | ORAL_TABLET | Freq: Once | ORAL | Status: AC
  Administered 2014-08-24: 1 via ORAL
  Filled 2014-08-24: qty 1

## 2014-08-24 MED ORDER — CYCLOBENZAPRINE HCL 10 MG PO TABS
10.0000 mg | ORAL_TABLET | Freq: Once | ORAL | Status: AC
  Administered 2014-08-24: 10 mg via ORAL
  Filled 2014-08-24: qty 1

## 2014-08-24 MED ORDER — CYCLOBENZAPRINE HCL 10 MG PO TABS: 10.0000 mg | ORAL_TABLET | Freq: Two times a day (BID) | ORAL | Status: DC | PRN

## 2014-08-24 NOTE — Discharge Instructions (Signed)
Cervical Sprain A cervical sprain is when the tissues (ligaments) that hold the neck bones in place stretch or tear. HOME CARE   Put ice on the injured area.  Put ice in a plastic bag.  Place a towel between your skin and the bag.  Leave the ice on for 15-20 minutes, 3-4 times a day.  You may have been given a collar to wear. This collar keeps your neck from moving while you heal.  Do not take the collar off unless told by your doctor.  If you have long hair, keep it outside of the collar.  Ask your doctor before changing the position of your collar. You may need to change its position over time to make it more comfortable.  If you are allowed to take off the collar for cleaning or bathing, follow your doctor's instructions on how to do it safely.  Keep your collar clean by wiping it with mild soap and water. Dry it completely. If the collar has removable pads, remove them every 1-2 days to hand wash them with soap and water. Allow them to air dry. They should be dry before you wear them in the collar.  Do not drive while wearing the collar.  Only take medicine as told by your doctor.  Keep all doctor visits as told.  Keep all physical therapy visits as told.  Adjust your work station so that you have good posture while you work.  Avoid positions and activities that make your problems worse.  Warm up and stretch before being active. GET HELP IF:  Your pain is not controlled with medicine.  You cannot take less pain medicine over time as planned.  Your activity level does not improve as expected. GET HELP RIGHT AWAY IF:   You are bleeding.  Your stomach is upset.  You have an allergic reaction to your medicine.  You develop new problems that you cannot explain.  You lose feeling (become numb) or you cannot move any part of your body (paralysis).  You have tingling or weakness in any part of your body.  Your symptoms get worse. Symptoms include:  Pain,  soreness, stiffness, puffiness (swelling), or a burning feeling in your neck.  Pain when your neck is touched.  Shoulder or upper back pain.  Limited ability to move your neck.  Headache.  Dizziness.  Your hands or arms feel week, lose feeling, or tingle.  Muscle spasms.  Difficulty swallowing or chewing. MAKE SURE YOU:   Understand these instructions.  Will watch your condition.  Will get help right away if you are not doing well or get worse. Document Released: 05/14/2008 Document Revised: 07/29/2013 Document Reviewed: 06/03/2013 [facility] Patient Information 2015 [place], [place]. This information is not intended to replace advice given to you by your health care provider. Make sure you discuss any questions you have with your health care provider.   X-ray shows no fracture.   Medication for pain and muscle spasm.  You will be sore for several days

## 2014-09-02 ENCOUNTER — Telehealth (HOSPITAL_COMMUNITY): Payer: Self-pay | Admitting: *Deleted

## 2014-09-02 NOTE — Telephone Encounter (Signed)
pt daughter called and I spoke to daughter and pt. Pt have been crying all day and all night and don't know what to do. Pt would like for Dr. Tenny Craw to call her back. [phone]

## 2014-09-03 NOTE — Telephone Encounter (Signed)
Patient had bad panic attack yesterday after learning that her disability was denied. She feels better today. Denies suicidal ideation. Please call patient to come in sooner

## 2014-09-06 ENCOUNTER — Ambulatory Visit (HOSPITAL_COMMUNITY): Payer: Self-pay | Admitting: Psychiatry

## 2014-09-06 ENCOUNTER — Ambulatory Visit (INDEPENDENT_AMBULATORY_CARE_PROVIDER_SITE_OTHER): Payer: 59 | Admitting: Psychiatry

## 2014-09-06 ENCOUNTER — Encounter (HOSPITAL_COMMUNITY): Payer: Self-pay | Admitting: Psychiatry

## 2014-09-06 VITALS — BP 143/70 | HR 81 | Ht 67.5 in | Wt 279.2 lb

## 2014-09-06 DIAGNOSIS — F333 Major depressive disorder, recurrent, severe with psychotic symptoms: Secondary | ICD-10-CM

## 2014-09-06 DIAGNOSIS — F4310 Post-traumatic stress disorder, unspecified: Secondary | ICD-10-CM

## 2014-09-06 MED ORDER — CLONAZEPAM 0.5 MG PO TABS
0.5000 mg | ORAL_TABLET | Freq: Three times a day (TID) | ORAL | Status: DC | PRN
Start: 2014-09-06 — End: 2014-10-06

## 2014-09-06 NOTE — Telephone Encounter (Signed)
Pt made appt and came into office for sooner appt.
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
## 2014-09-06 NOTE — Patient Instructions (Signed)
Call Dr. Sudie Bailey for follow up ASAP

## 2014-09-20 ENCOUNTER — Ambulatory Visit (HOSPITAL_COMMUNITY)
Admission: RE | Admit: 2014-09-20 | Discharge: 2014-09-20 | Disposition: A | Discharge status: Home / Self Care | Payer: Medicaid Other | Source: Ambulatory Visit | Attending: Family Medicine | Admitting: Family Medicine

## 2014-09-20 ENCOUNTER — Other Ambulatory Visit (HOSPITAL_COMMUNITY): Payer: Self-pay | Admitting: Family Medicine

## 2014-09-20 DIAGNOSIS — M5441 Lumbago with sciatica, right side: Secondary | ICD-10-CM

## 2014-09-20 DIAGNOSIS — M47894 Other spondylosis, thoracic region: Secondary | ICD-10-CM | POA: Insufficient documentation

## 2014-09-20 DIAGNOSIS — M5442 Lumbago with sciatica, left side: Principal | ICD-10-CM

## 2014-09-20 DIAGNOSIS — M545 Low back pain: Secondary | ICD-10-CM | POA: Diagnosis present

## 2014-09-20 DIAGNOSIS — M546 Pain in thoracic spine: Secondary | ICD-10-CM | POA: Insufficient documentation

## 2014-09-22 ENCOUNTER — Ambulatory Visit (HOSPITAL_COMMUNITY)
Admission: RE | Admit: 2014-09-22 | Discharge: 2014-09-22 | Disposition: A | Discharge status: Home / Self Care | Payer: Medicaid Other | Source: Ambulatory Visit | Attending: Family Medicine | Admitting: Family Medicine

## 2014-09-22 DIAGNOSIS — Z1231 Encounter for screening mammogram for malignant neoplasm of breast: Secondary | ICD-10-CM | POA: Diagnosis not present

## 2014-09-30 ENCOUNTER — Other Ambulatory Visit: Payer: Self-pay | Admitting: Neurology

## 2014-09-30 DIAGNOSIS — R51 Headache: Principal | ICD-10-CM

## 2014-09-30 DIAGNOSIS — R519 Headache, unspecified: Secondary | ICD-10-CM

## 2014-10-01 ENCOUNTER — Ambulatory Visit (HOSPITAL_COMMUNITY)
Admission: RE | Admit: 2014-10-01 | Discharge: 2014-10-01 | Disposition: A | Discharge status: Home / Self Care | Payer: Medicaid Other | Source: Ambulatory Visit | Attending: Neurology | Admitting: Neurology

## 2014-10-01 ENCOUNTER — Ambulatory Visit (INDEPENDENT_AMBULATORY_CARE_PROVIDER_SITE_OTHER): Payer: 59 | Admitting: Psychiatry

## 2014-10-01 DIAGNOSIS — R519 Headache, unspecified: Secondary | ICD-10-CM

## 2014-10-01 DIAGNOSIS — R42 Dizziness and giddiness: Secondary | ICD-10-CM | POA: Insufficient documentation

## 2014-10-01 DIAGNOSIS — F333 Major depressive disorder, recurrent, severe with psychotic symptoms: Secondary | ICD-10-CM

## 2014-10-01 DIAGNOSIS — M852 Hyperostosis of skull: Secondary | ICD-10-CM | POA: Insufficient documentation

## 2014-10-01 DIAGNOSIS — F4310 Post-traumatic stress disorder, unspecified: Secondary | ICD-10-CM

## 2014-10-01 DIAGNOSIS — R51 Headache: Secondary | ICD-10-CM | POA: Insufficient documentation

## 2014-10-01 NOTE — Patient Instructions (Signed)
Discussed orally 
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
## 2014-10-06 ENCOUNTER — Ambulatory Visit (INDEPENDENT_AMBULATORY_CARE_PROVIDER_SITE_OTHER): Payer: MEDICAID | Admitting: Psychiatry

## 2014-10-06 ENCOUNTER — Encounter (HOSPITAL_COMMUNITY): Payer: Self-pay | Admitting: Psychiatry

## 2014-10-06 VITALS — BP 136/75 | HR 92 | Ht 67.5 in | Wt 278.8 lb

## 2014-10-06 DIAGNOSIS — F4310 Post-traumatic stress disorder, unspecified: Secondary | ICD-10-CM

## 2014-10-06 DIAGNOSIS — F333 Major depressive disorder, recurrent, severe with psychotic symptoms: Secondary | ICD-10-CM

## 2014-10-06 MED ORDER — GABAPENTIN 100 MG PO CAPS: 100.0000 mg | ORAL_CAPSULE | Freq: Three times a day (TID) | ORAL | Status: DC

## 2014-10-06 MED ORDER — DULOXETINE HCL 60 MG PO CPEP: ORAL_CAPSULE | ORAL | Status: DC

## 2014-10-06 MED ORDER — ALPRAZOLAM 0.5 MG PO TABS
0.5000 mg | ORAL_TABLET | Freq: Three times a day (TID) | ORAL | Status: DC
Start: 2014-10-06 — End: 2015-01-14
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
## 2014-10-08 ENCOUNTER — Ambulatory Visit (HOSPITAL_COMMUNITY): Payer: Self-pay | Admitting: Psychiatry

## 2014-10-09 ENCOUNTER — Emergency Department (HOSPITAL_COMMUNITY)
Admission: EM | Admit: 2014-10-09 | Discharge: 2014-10-09 | Discharge status: Against Medical Advice | Payer: Medicaid Other | Attending: Emergency Medicine | Admitting: Emergency Medicine

## 2014-10-09 ENCOUNTER — Encounter (HOSPITAL_COMMUNITY): Payer: Self-pay | Admitting: Emergency Medicine

## 2014-10-09 DIAGNOSIS — F329 Major depressive disorder, single episode, unspecified: Secondary | ICD-10-CM | POA: Diagnosis not present

## 2014-10-09 DIAGNOSIS — I10 Essential (primary) hypertension: Secondary | ICD-10-CM | POA: Diagnosis not present

## 2014-10-09 DIAGNOSIS — Z79899 Other long term (current) drug therapy: Secondary | ICD-10-CM | POA: Diagnosis not present

## 2014-10-09 DIAGNOSIS — R102 Pelvic and perineal pain: Secondary | ICD-10-CM | POA: Diagnosis not present

## 2014-10-09 DIAGNOSIS — Z791 Long term (current) use of non-steroidal anti-inflammatories (NSAID): Secondary | ICD-10-CM | POA: Insufficient documentation

## 2014-10-09 DIAGNOSIS — Z9851 Tubal ligation status: Secondary | ICD-10-CM | POA: Diagnosis not present

## 2014-10-09 DIAGNOSIS — R1030 Lower abdominal pain, unspecified: Secondary | ICD-10-CM | POA: Diagnosis present

## 2014-10-09 DIAGNOSIS — M1990 Unspecified osteoarthritis, unspecified site: Secondary | ICD-10-CM | POA: Insufficient documentation

## 2014-10-09 NOTE — ED Notes (Signed)
Patient is unable to urinate at this time 

## 2014-10-09 NOTE — ED Notes (Signed)
Pt reporting intermittent contracting pain in area of c-section scar.  Experiencing symptoms for past 4 days.  Denies nausea or vomiting.

## 2014-10-09 NOTE — ED Provider Notes (Signed)
CSN: [id number]     Arrival date & time 10/09/14  0135 History   First MD Initiated Contact with Patient 10/09/14 0215     Chief Complaint  Patient presents with  . Abdominal Pain     (Consider location/radiation/quality/duration/timing/severity/associated sxs/prior Treatment) HPI This is a 53 year old female with a four-day history of intermittent, cramping, "excruciating" pain in her lower abdomen. She relates the pain to the location of her C-section scar. There is been no associated fever, chills, nausea, vomiting, diarrhea, dysuria, hematuria, vaginal bleeding or vaginal discharge. Pain is somewhat worse with movement or palpation.  Past Medical History  Diagnosis Date  . Arthritis   . Hypertension   . Depression   . Sleep apnea     by clinical history  . Dyspnea     a. Adm 12/2013 for dyspnea/palps: normal stress-pics-only Lexiscan nuc, echo unremarkable with normal EF, d-dimer normal, ruled out for MI.  Marland Kitchen. Hyperglycemia   . Elevated lipids   . Morbid obesity    Past Surgical History  Procedure Laterality Date  . Cesarean section    . Tubal ligation     Family History  Problem Relation Age of Onset  . Coronary artery disease Sister 40    stents  . Depression Sister   . Coronary artery disease Sister   . Heart attack Sister [id number]    CHF, MI  . Depression Mother   . Alcohol abuse Father   . Schizophrenia Brother   . Alcohol abuse Brother    History  Substance Use Topics  . Smoking status: Never Smoker   . Smokeless tobacco: Not on file  . Alcohol Use: No   OB History   Grav Para Term Preterm Abortions TAB SAB Ect Mult Living                 Review of Systems  All other systems reviewed and are negative.   Allergies  Review of patient's allergies indicates no known allergies.  Home Medications   Prior to Admission medications   Medication Sig Start Date End Date Taking? Authorizing Provider  ALPRAZolam Prudy Feeler(XANAX) 0.5 MG tablet Take 1 tablet (0.5 mg total)  by mouth 3 (three) times daily. 10/06/14 10/06/15  Diannia Rudereborah Ross, MD  DULoxetine (CYMBALTA) 60 MG capsule Take one at bedtime 10/06/14   Diannia Rudereborah Ross, MD  furosemide (LASIX) 40 MG tablet Take 40 mg by mouth daily.    Historical Provider, MD  gabapentin (NEURONTIN) 100 MG capsule Take 1 capsule (100 mg total) by mouth 3 (three) times daily. 10/06/14 10/06/15  Diannia Rudereborah Ross, MD  HYDROcodone-acetaminophen (NORCO) 7.5-325 MG per tablet Take 1 tablet by mouth every 6 (six) hours as needed for moderate pain.    Historical Provider, MD  naproxen (NAPROSYN) 500 MG tablet Take 1 tablet (500 mg total) by mouth 2 (two) times daily. 08/24/14   Donnetta HutchingBrian Cook, MD   BP 131/93  Pulse 95  Temp(Src) 98.2 F (36.8 C) (Oral)  Resp 18  Ht 5\' 7"  (1.702 m)  Wt 272 lb (123.378 kg)  BMI 42.59 kg/m2  SpO2 98%  LMP 09/23/2014  Physical Exam General: Well-developed, well-nourished female in no acute distress; appearance consistent with age of record HENT: normocephalic; atraumatic Eyes: pupils equal, round and reactive to light; extraocular muscles intact Neck: supple Heart: regular rate and rhythm Lungs: clear to auscultation bilaterally Abdomen: soft; nondistended; low suprapubic tenderness; no masses or hepatosplenomegaly; bowel sounds present GU:  Extremities: No deformity; full range of motion; pulses normal  Neurologic: Awake, alert and oriented; motor function intact in all extremities and symmetric; no facial droop Skin: Warm and dry Psychiatric: Normal mood and affect    ED Course  Procedures (including critical care time)   MDM  2:35 AM Patient refuses a pelvic exam. She was advised that could not effectively assess her pain without a proper exam. She was allowed to sign out AGAINST MEDICAL ADVICE with the understanding that she is free to return should she change her mind at any time.    Hanley SeamenJohn L Ryen Rhames, MD 10/09/14 [id number]

## 2014-10-28 ENCOUNTER — Ambulatory Visit (INDEPENDENT_AMBULATORY_CARE_PROVIDER_SITE_OTHER): Payer: MEDICAID | Admitting: Psychiatry

## 2014-10-28 DIAGNOSIS — F333 Major depressive disorder, recurrent, severe with psychotic symptoms: Secondary | ICD-10-CM

## 2014-10-28 DIAGNOSIS — F4310 Post-traumatic stress disorder, unspecified: Secondary | ICD-10-CM

## 2014-10-28 NOTE — Patient Instructions (Signed)
Discussed orally 
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
## 2014-11-02 ENCOUNTER — Encounter (HOSPITAL_COMMUNITY): Payer: Self-pay | Admitting: Psychiatry

## 2014-11-02 ENCOUNTER — Ambulatory Visit (INDEPENDENT_AMBULATORY_CARE_PROVIDER_SITE_OTHER): Payer: MEDICAID | Admitting: Psychiatry

## 2014-11-02 VITALS — BP 133/101 | HR 92 | Ht 67.5 in | Wt 281.0 lb

## 2014-11-02 DIAGNOSIS — F4310 Post-traumatic stress disorder, unspecified: Secondary | ICD-10-CM

## 2014-11-02 DIAGNOSIS — F333 Major depressive disorder, recurrent, severe with psychotic symptoms: Secondary | ICD-10-CM

## 2014-11-02 MED ORDER — DULOXETINE HCL 60 MG PO CPEP: ORAL_CAPSULE | ORAL | Status: DC

## 2014-11-02 MED ORDER — GABAPENTIN 100 MG PO CAPS: 100.0000 mg | ORAL_CAPSULE | Freq: Three times a day (TID) | ORAL | Status: DC
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
## 2014-11-15 ENCOUNTER — Ambulatory Visit (INDEPENDENT_AMBULATORY_CARE_PROVIDER_SITE_OTHER): Payer: MEDICAID | Admitting: Psychiatry

## 2014-11-15 DIAGNOSIS — F333 Major depressive disorder, recurrent, severe with psychotic symptoms: Secondary | ICD-10-CM
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
## 2014-11-15 NOTE — Patient Instructions (Signed)
Discussed orally 

## 2014-11-18 ENCOUNTER — Ambulatory Visit (HOSPITAL_COMMUNITY): Payer: Self-pay | Admitting: Psychiatry

## 2014-12-13 ENCOUNTER — Ambulatory Visit (INDEPENDENT_AMBULATORY_CARE_PROVIDER_SITE_OTHER): Payer: MEDICAID | Admitting: Psychiatry

## 2014-12-13 DIAGNOSIS — F333 Major depressive disorder, recurrent, severe with psychotic symptoms: Secondary | ICD-10-CM

## 2014-12-13 NOTE — Patient Instructions (Signed)
Discussed orally 
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
## 2014-12-31 ENCOUNTER — Ambulatory Visit (HOSPITAL_COMMUNITY): Payer: Self-pay | Admitting: Psychiatry

## 2015-01-05 ENCOUNTER — Ambulatory Visit (INDEPENDENT_AMBULATORY_CARE_PROVIDER_SITE_OTHER): Payer: MEDICAID | Admitting: Psychiatry

## 2015-01-05 DIAGNOSIS — F333 Major depressive disorder, recurrent, severe with psychotic symptoms: Secondary | ICD-10-CM

## 2015-01-05 DIAGNOSIS — F4310 Post-traumatic stress disorder, unspecified: Secondary | ICD-10-CM

## 2015-01-06 NOTE — Patient Instructions (Signed)
Discussed orally 
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
## 2015-01-12 ENCOUNTER — Other Ambulatory Visit (HOSPITAL_COMMUNITY): Payer: Self-pay | Admitting: Psychiatry

## 2015-01-14 ENCOUNTER — Telehealth (HOSPITAL_COMMUNITY): Payer: Self-pay | Admitting: *Deleted

## 2015-01-14 ENCOUNTER — Other Ambulatory Visit (HOSPITAL_COMMUNITY): Payer: Self-pay | Admitting: *Deleted

## 2015-01-14 MED ORDER — ALPRAZOLAM 0.5 MG PO TABS: 0.5000 mg | ORAL_TABLET | Freq: Three times a day (TID) | ORAL | Status: DC

## 2015-01-14 NOTE — Telephone Encounter (Signed)
Pt pharmacy requesting refill request for pt Xanax. Pt medication was last filled 10-06-14 with 2 refills. Pt f/u appt is 01-19-15

## 2015-01-14 NOTE — Telephone Encounter (Signed)
Called in pt rx and spoke with Almeta Monashase

## 2015-01-14 NOTE — Telephone Encounter (Signed)
Per Dr. Tenny Crawoss to call in 10 days supply of pt Xanax. Called pt pharmacy and spoke with Almeta Monashase and informed him of what Dr Tenny Crawoss stated and he showed understanding. Per Almeta Monashase he will get medication ready for pt.

## 2015-01-14 NOTE — Telephone Encounter (Signed)
You may call in 10 day supply

## 2015-01-19 ENCOUNTER — Ambulatory Visit (INDEPENDENT_AMBULATORY_CARE_PROVIDER_SITE_OTHER): Payer: MEDICAID | Admitting: Psychiatry

## 2015-01-19 ENCOUNTER — Encounter (HOSPITAL_COMMUNITY): Payer: Self-pay | Admitting: Psychiatry

## 2015-01-19 VITALS — BP 126/96 | HR 93 | Ht 67.5 in | Wt 286.0 lb

## 2015-01-19 DIAGNOSIS — F333 Major depressive disorder, recurrent, severe with psychotic symptoms: Secondary | ICD-10-CM

## 2015-01-19 DIAGNOSIS — F4310 Post-traumatic stress disorder, unspecified: Secondary | ICD-10-CM

## 2015-01-19 MED ORDER — ALPRAZOLAM 0.5 MG PO TABS: 0.5000 mg | ORAL_TABLET | Freq: Three times a day (TID) | ORAL | Status: DC

## 2015-01-19 MED ORDER — GABAPENTIN 100 MG PO CAPS: 100.0000 mg | ORAL_CAPSULE | Freq: Three times a day (TID) | ORAL | Status: DC

## 2015-01-19 MED ORDER — DULOXETINE HCL 60 MG PO CPEP: ORAL_CAPSULE | ORAL | Status: DC
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
## 2015-01-21 ENCOUNTER — Ambulatory Visit (HOSPITAL_COMMUNITY): Payer: Self-pay | Admitting: Psychiatry

## 2015-02-01 ENCOUNTER — Ambulatory Visit (INDEPENDENT_AMBULATORY_CARE_PROVIDER_SITE_OTHER): Payer: MEDICAID | Admitting: Psychiatry

## 2015-02-01 DIAGNOSIS — F332 Major depressive disorder, recurrent severe without psychotic features: Secondary | ICD-10-CM
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
## 2015-02-01 NOTE — Patient Instructions (Signed)
Discussed orally 

## 2015-02-22 ENCOUNTER — Other Ambulatory Visit (HOSPITAL_COMMUNITY): Payer: Self-pay | Admitting: Family Medicine

## 2015-02-22 ENCOUNTER — Ambulatory Visit (HOSPITAL_COMMUNITY)
Admission: RE | Admit: 2015-02-22 | Discharge: 2015-02-22 | Disposition: A | Discharge status: Home / Self Care | Payer: Medicaid Other | Source: Ambulatory Visit | Attending: Family Medicine | Admitting: Family Medicine

## 2015-02-22 DIAGNOSIS — R918 Other nonspecific abnormal finding of lung field: Secondary | ICD-10-CM | POA: Insufficient documentation

## 2015-02-22 DIAGNOSIS — R0602 Shortness of breath: Secondary | ICD-10-CM

## 2015-02-22 DIAGNOSIS — R079 Chest pain, unspecified: Secondary | ICD-10-CM | POA: Diagnosis not present

## 2015-03-01 ENCOUNTER — Ambulatory Visit (HOSPITAL_COMMUNITY): Payer: Self-pay | Admitting: Psychiatry

## 2015-03-22 ENCOUNTER — Ambulatory Visit (INDEPENDENT_AMBULATORY_CARE_PROVIDER_SITE_OTHER): Payer: MEDICAID | Admitting: Psychiatry

## 2015-03-22 DIAGNOSIS — F332 Major depressive disorder, recurrent severe without psychotic features: Secondary | ICD-10-CM | POA: Diagnosis not present
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
## 2015-03-22 NOTE — Patient Instructions (Signed)
Discussed orally 

## 2015-03-24 ENCOUNTER — Other Ambulatory Visit (HOSPITAL_COMMUNITY): Payer: Self-pay | Admitting: Psychiatry

## 2015-04-12 ENCOUNTER — Other Ambulatory Visit (HOSPITAL_COMMUNITY): Payer: Self-pay | Admitting: Respiratory Therapy

## 2015-04-12 DIAGNOSIS — R0602 Shortness of breath: Secondary | ICD-10-CM

## 2015-04-15 ENCOUNTER — Ambulatory Visit (HOSPITAL_COMMUNITY)
Admission: RE | Admit: 2015-04-15 | Discharge: 2015-04-15 | Disposition: A | Discharge status: Home / Self Care | Payer: Medicaid Other | Source: Ambulatory Visit | Attending: Pulmonary Disease | Admitting: Pulmonary Disease

## 2015-04-15 ENCOUNTER — Encounter (HOSPITAL_COMMUNITY): Payer: Self-pay

## 2015-04-15 DIAGNOSIS — R0602 Shortness of breath: Secondary | ICD-10-CM | POA: Diagnosis not present

## 2015-04-15 MED ORDER — ALBUTEROL SULFATE (2.5 MG/3ML) 0.083% IN NEBU
2.5000 mg | INHALATION_SOLUTION | Freq: Once | RESPIRATORY_TRACT | Status: AC
  Administered 2015-04-15: 2.5 mg via RESPIRATORY_TRACT

## 2015-04-17 LAB — PULMONARY FUNCTION TEST
DL/VA % PRED: 133 %
DL/VA: 6.94 ml/min/mmHg/L
DLCO unc % pred: 82 %
DLCO unc: 23.96 ml/min/mmHg
FEF 25-75 Post: 1.89 L/sec
FEF 25-75 Pre: 3.5 L/sec
FEF2575-%Change-Post: -45 %
FEF2575-%PRED-PRE: 131 %
FEF2575-%Pred-Post: 71 %
FEV1-%Change-Post: -11 %
FEV1-%PRED-POST: 73 %
FEV1-%PRED-PRE: 83 %
FEV1-POST: 1.92 L
FEV1-Pre: 2.17 L
FEV1FVC-%Change-Post: 0 %
FEV1FVC-%PRED-PRE: 115 %
FEV6-%Change-Post: -5 %
FEV6-%PRED-PRE: 68 %
FEV6-%Pred-Post: 64 %
FEV6-PRE: 2.19 L
FEV6-Post: 2.06 L
FEV6FVC-%Pred-Post: 103 %
FEV6FVC-%Pred-Pre: 103 %
FVC-%Change-Post: -11 %
FVC-%PRED-PRE: 71 %
FVC-%Pred-Post: 63 %
FVC-Post: 2.06 L
FVC-Pre: 2.32 L
POST FEV1/FVC RATIO: 93 %
PRE FEV6/FVC RATIO: 100 %
Post FEV6/FVC ratio: 100 %
Pre FEV1/FVC ratio: 94 %
RV % pred: 95 %
RV: 1.91 L
TLC % PRED: 73 %
TLC: 4.09 L

## 2015-04-20 ENCOUNTER — Ambulatory Visit (HOSPITAL_COMMUNITY): Payer: Self-pay | Admitting: Psychiatry

## 2015-04-22 ENCOUNTER — Ambulatory Visit (INDEPENDENT_AMBULATORY_CARE_PROVIDER_SITE_OTHER): Payer: MEDICAID | Admitting: Psychiatry

## 2015-04-22 DIAGNOSIS — F332 Major depressive disorder, recurrent severe without psychotic features: Secondary | ICD-10-CM | POA: Diagnosis not present

## 2015-04-22 DIAGNOSIS — F4310 Post-traumatic stress disorder, unspecified: Secondary | ICD-10-CM
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
## 2015-04-22 NOTE — Patient Instructions (Signed)
Discussed orally 

## 2015-04-25 ENCOUNTER — Ambulatory Visit (INDEPENDENT_AMBULATORY_CARE_PROVIDER_SITE_OTHER): Payer: MEDICAID | Admitting: Psychiatry

## 2015-04-25 ENCOUNTER — Encounter (HOSPITAL_COMMUNITY): Payer: Self-pay | Admitting: Psychiatry

## 2015-04-25 VITALS — BP 158/93 | HR 93 | Ht 67.5 in | Wt 290.0 lb

## 2015-04-25 DIAGNOSIS — F4310 Post-traumatic stress disorder, unspecified: Secondary | ICD-10-CM

## 2015-04-25 DIAGNOSIS — F332 Major depressive disorder, recurrent severe without psychotic features: Secondary | ICD-10-CM | POA: Diagnosis not present

## 2015-04-25 MED ORDER — ALPRAZOLAM 0.5 MG PO TABS: 0.5000 mg | ORAL_TABLET | Freq: Three times a day (TID) | ORAL | Status: DC

## 2015-04-25 MED ORDER — DULOXETINE HCL 60 MG PO CPEP: ORAL_CAPSULE | ORAL | Status: DC
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
## 2015-05-03 ENCOUNTER — Telehealth: Payer: Self-pay | Admitting: Orthopedic Surgery

## 2015-05-03 ENCOUNTER — Ambulatory Visit (INDEPENDENT_AMBULATORY_CARE_PROVIDER_SITE_OTHER): Payer: Medicaid Other | Admitting: Orthopedic Surgery

## 2015-05-03 ENCOUNTER — Ambulatory Visit (INDEPENDENT_AMBULATORY_CARE_PROVIDER_SITE_OTHER): Payer: Medicaid Other

## 2015-05-03 ENCOUNTER — Encounter: Payer: Self-pay | Admitting: Orthopedic Surgery

## 2015-05-03 VITALS — BP 131/75 | Ht 68.0 in | Wt 282.0 lb

## 2015-05-03 DIAGNOSIS — M25562 Pain in left knee: Secondary | ICD-10-CM

## 2015-05-03 DIAGNOSIS — M1712 Unilateral primary osteoarthritis, left knee: Secondary | ICD-10-CM | POA: Diagnosis not present

## 2015-05-03 MED ORDER — DICLOFENAC POTASSIUM 50 MG PO TABS: 50.0000 mg | ORAL_TABLET | Freq: Two times a day (BID) | ORAL | Status: DC

## 2015-05-03 NOTE — Telephone Encounter (Signed)
Patient is calling asking if its ok for her to join a gym to exercise, please advise?

## 2015-05-03 NOTE — Progress Notes (Signed)
Patient ID: Annette Bryant, female   DOB: Apr 03, 1961, 54 y.o.   MRN: [id number]  Chief Complaint  Patient presents with  . Knee Pain    Left knee pain, no injury. Referred by Dr. Sudie Bailey.     Annette Bryant is a 54 y.o. female.   HPI 53 years and 8 months female no history of trauma presents for evaluation and treatment of left knee pain history of arthritis 3 years ago. She has giving out aching nighttime pain pain after activity treatment so far naproxen and hydrocodone 7.5 mg not controlling her pain. Standing makes it worse especially if it for long periods of time. Denies locking catching burning. Review of Systems Fatigue vision problems shortness of breath heart palpitations irregular heartbeat depression anxiety lightheadedness joint pain limb pain muscle weakness differential joint swollen joints back pain  Past Medical History  Diagnosis Date  . Arthritis   . Hypertension   . Depression   . Sleep apnea     by clinical history  . Dyspnea     a. Adm 12/2013 for dyspnea/palps: normal stress-pics-only Lexiscan nuc, echo unremarkable with normal EF, d-dimer normal, ruled out for MI.  Marland Kitchen Hyperglycemia   . Elevated lipids   . Morbid obesity     Past Surgical History  Procedure Laterality Date  . Cesarean section    . Tubal ligation      Family History  Problem Relation Age of Onset  . Coronary artery disease Sister 40    stents  . Depression Sister   . Coronary artery disease Sister   . Heart attack Sister 8    CHF, MI  . Depression Mother   . Alcohol abuse Father   . Schizophrenia Brother   . Alcohol abuse Brother     Social History History  Substance Use Topics  . Smoking status: Never Smoker   . Smokeless tobacco: Not on file  . Alcohol Use: No    No Known Allergies  Current Outpatient Prescriptions  Medication Sig Dispense Refill  . atorvastatin (LIPITOR) 20 MG tablet Take 20 mg by mouth daily.  11  . furosemide (LASIX) 40 MG tablet Take 40 mg by  mouth daily.    Marland Kitchen ALPRAZolam (XANAX) 0.5 MG tablet Take 1 tablet (0.5 mg total) by mouth 3 (three) times daily. 90 tablet 2  . diclofenac (CATAFLAM) 50 MG tablet Take 1 tablet (50 mg total) by mouth 2 (two) times daily. 60 tablet 0  . DULoxetine (CYMBALTA) 60 MG capsule Take one at bedtime 30 capsule 2  . HYDROcodone-acetaminophen (NORCO) 7.5-325 MG per tablet Take 1 tablet by mouth every 6 (six) hours as needed for moderate pain.     No current facility-administered medications for this visit.       Physical Exam Blood pressure 131/75, height  (1.727 m), weight 282 lb (127.914 kg), last menstrual period 03/23/2015. Physical Exam The patient is well developed well nourished and well groomed. Orientation to person place and time is normal  Mood is pleasant. Ambulatory status normal ambulation at this point no limp Right knee nontender full range of motion ligament stable motor exam normal.  Left knee no swelling medial joint line tenderness. Full range of motion. Knee stable. Motor exam normal. Skin normal. Sensation and pulses normal in both legs.  Data Reviewed Are x-ray today shows moderate arthritis left knee  Assessment Encounter Diagnoses  Name Primary?  . Left knee pain Yes  . Primary osteoarthritis of left knee  Plan Procedure note left knee injection verbal consent was obtained to inject left knee joint  Timeout was completed to confirm the site of injection  The medications used were 40 mg of Depo-Medrol and 1% lidocaine 3 cc  Anesthesia was provided by ethyl chloride and the skin was prepped with alcohol.  After cleaning the skin with alcohol a 20-gauge needle was used to inject the left knee joint. There were no complications. A sterile bandage was applied.   Change naproxen to diclofenac 50 twice today  Follow-up 3 months

## 2015-05-03 NOTE — Patient Instructions (Signed)
Joint Injection  Care After  Refer to this sheet in the next few days. These instructions provide you with information on caring for yourself after you have had a joint injection. Your caregiver also may give you more specific instructions. Your treatment has been planned according to current medical practices, but problems sometimes occur. Call your caregiver if you have any problems or questions after your procedure.  After any type of joint injection, it is not uncommon to experience:  · Soreness, swelling, or bruising around the injection site.  · Mild numbness, tingling, or weakness around the injection site caused by the numbing medicine used before or with the injection.  It also is possible to experience the following effects associated with the specific agent after injection:  · Iodine-based contrast agents:  ¨ Allergic reaction (itching, hives, widespread redness, and swelling beyond the injection site).  · Corticosteroids (These effects are rare.):  ¨ Allergic reaction.  ¨ Increased blood sugar levels (If you have diabetes and you notice that your blood sugar levels have increased, notify your caregiver).  ¨ Increased blood pressure levels.  ¨ Mood swings.  · Hyaluronic acid in the use of viscosupplementation.  ¨ Temporary heat or redness.  ¨ Temporary rash and itching.  ¨ Increased fluid accumulation in the injected joint.  These effects all should resolve within a day after your procedure.   HOME CARE INSTRUCTIONS  · Limit yourself to light activity the day of your procedure. Avoid lifting heavy objects, bending, stooping, or twisting.  · Take prescription or over-the-counter pain medication as directed by your caregiver.  · You may apply ice to your injection site to reduce pain and swelling the day of your procedure. Ice may be applied 03-04 times:  ¨ Put ice in a plastic bag.  ¨ Place a towel between your skin and the bag.  ¨ Leave the ice on for no longer than 15-20 minutes each time.  SEEK  IMMEDIATE MEDICAL CARE IF:   · Pain and swelling get worse rather than better or extend beyond the injection site.  · Numbness does not go away.  · Blood or fluid continues to leak from the injection site.  · You have chest pain.  · You have swelling of your face or tongue.  · You have trouble breathing or you become dizzy.  · You develop a fever, chills, or severe tenderness at the injection site that last longer than 1 day.  MAKE SURE YOU:  · Understand these instructions.  · Watch your condition.  · Get help right away if you are not doing well or if you get worse.  Document Released: 08/09/2011 Document Revised: 02/18/2012 Document Reviewed: 08/09/2011  ExitCare® Patient Information ©2015 [employer]. This information is not intended to replace advice given to you by your health care provider. Make sure you discuss any questions you have with your health care provider.

## 2015-05-04 NOTE — Telephone Encounter (Signed)
yes

## 2015-05-04 NOTE — Telephone Encounter (Signed)
Patient is aware 

## 2015-05-19 ENCOUNTER — Ambulatory Visit (INDEPENDENT_AMBULATORY_CARE_PROVIDER_SITE_OTHER): Payer: MEDICAID | Admitting: Psychiatry

## 2015-05-19 DIAGNOSIS — F332 Major depressive disorder, recurrent severe without psychotic features: Secondary | ICD-10-CM
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
## 2015-05-19 NOTE — Patient Instructions (Signed)
Discussed orally 

## 2015-06-03 ENCOUNTER — Other Ambulatory Visit: Payer: Self-pay | Admitting: Orthopedic Surgery

## 2015-06-09 ENCOUNTER — Ambulatory Visit (HOSPITAL_COMMUNITY): Payer: Self-pay | Admitting: Psychiatry

## 2015-06-09 ENCOUNTER — Other Ambulatory Visit: Payer: Self-pay | Admitting: *Deleted

## 2015-06-09 MED ORDER — DICLOFENAC POTASSIUM 50 MG PO TABS
50.0000 mg | ORAL_TABLET | Freq: Two times a day (BID) | ORAL | Status: DC
Start: 2015-06-09 — End: 2015-08-13

## 2015-06-16 ENCOUNTER — Other Ambulatory Visit (HOSPITAL_COMMUNITY): Payer: Self-pay | Admitting: Pulmonary Disease

## 2015-06-16 DIAGNOSIS — R0602 Shortness of breath: Secondary | ICD-10-CM

## 2015-06-20 ENCOUNTER — Ambulatory Visit (INDEPENDENT_AMBULATORY_CARE_PROVIDER_SITE_OTHER): Payer: Medicaid Other | Admitting: Psychiatry

## 2015-06-20 ENCOUNTER — Ambulatory Visit (HOSPITAL_COMMUNITY)
Admission: RE | Admit: 2015-06-20 | Discharge: 2015-06-20 | Disposition: A | Discharge status: Home / Self Care | Payer: Medicaid Other | Source: Ambulatory Visit | Attending: Pulmonary Disease | Admitting: Pulmonary Disease

## 2015-06-20 DIAGNOSIS — F332 Major depressive disorder, recurrent severe without psychotic features: Secondary | ICD-10-CM

## 2015-06-20 DIAGNOSIS — R0609 Other forms of dyspnea: Secondary | ICD-10-CM | POA: Insufficient documentation

## 2015-06-20 DIAGNOSIS — R0602 Shortness of breath: Secondary | ICD-10-CM
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
## 2015-06-20 NOTE — Patient Instructions (Signed)
Discussed orally 

## 2015-07-01 ENCOUNTER — Ambulatory Visit (HOSPITAL_COMMUNITY): Payer: Self-pay | Admitting: Psychiatry

## 2015-07-13 ENCOUNTER — Telehealth (HOSPITAL_COMMUNITY): Payer: Self-pay | Admitting: *Deleted

## 2015-07-15 ENCOUNTER — Ambulatory Visit (HOSPITAL_COMMUNITY): Payer: Self-pay | Admitting: Psychiatry

## 2015-07-19 ENCOUNTER — Ambulatory Visit (INDEPENDENT_AMBULATORY_CARE_PROVIDER_SITE_OTHER): Payer: Medicaid Other | Admitting: Psychiatry

## 2015-07-19 DIAGNOSIS — F332 Major depressive disorder, recurrent severe without psychotic features: Secondary | ICD-10-CM | POA: Diagnosis not present

## 2015-07-19 DIAGNOSIS — F4310 Post-traumatic stress disorder, unspecified: Secondary | ICD-10-CM

## 2015-07-19 NOTE — Patient Instructions (Signed)
Discussed orally 
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
## 2015-07-20 ENCOUNTER — Other Ambulatory Visit (HOSPITAL_COMMUNITY): Payer: Self-pay | Admitting: Psychiatry

## 2015-07-26 ENCOUNTER — Encounter (HOSPITAL_COMMUNITY): Payer: Self-pay | Admitting: Psychiatry

## 2015-07-26 ENCOUNTER — Ambulatory Visit (INDEPENDENT_AMBULATORY_CARE_PROVIDER_SITE_OTHER): Payer: Medicaid Other | Admitting: Psychiatry

## 2015-07-26 VITALS — BP 126/82 | HR 80 | Ht 68.0 in | Wt 280.6 lb

## 2015-07-26 DIAGNOSIS — F332 Major depressive disorder, recurrent severe without psychotic features: Secondary | ICD-10-CM

## 2015-07-26 DIAGNOSIS — F4310 Post-traumatic stress disorder, unspecified: Secondary | ICD-10-CM | POA: Diagnosis not present

## 2015-07-26 MED ORDER — DULOXETINE HCL 60 MG PO CPEP: ORAL_CAPSULE | ORAL | Status: DC

## 2015-07-26 MED ORDER — ALPRAZOLAM 0.5 MG PO TABS: 0.5000 mg | ORAL_TABLET | Freq: Three times a day (TID) | ORAL | Status: DC
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
## 2015-08-04 ENCOUNTER — Ambulatory Visit: Payer: Medicaid Other | Admitting: Orthopedic Surgery

## 2015-08-09 ENCOUNTER — Emergency Department (HOSPITAL_COMMUNITY)
Admission: EM | Admit: 2015-08-09 | Discharge: 2015-08-09 | Disposition: A | Discharge status: Home / Self Care | Payer: Medicaid Other | Attending: Emergency Medicine | Admitting: Emergency Medicine

## 2015-08-09 ENCOUNTER — Emergency Department (HOSPITAL_COMMUNITY): Payer: Medicaid Other

## 2015-08-09 ENCOUNTER — Encounter (HOSPITAL_COMMUNITY): Payer: Self-pay | Admitting: Emergency Medicine

## 2015-08-09 DIAGNOSIS — M1990 Unspecified osteoarthritis, unspecified site: Secondary | ICD-10-CM | POA: Diagnosis not present

## 2015-08-09 DIAGNOSIS — I10 Essential (primary) hypertension: Secondary | ICD-10-CM | POA: Insufficient documentation

## 2015-08-09 DIAGNOSIS — Z79899 Other long term (current) drug therapy: Secondary | ICD-10-CM | POA: Insufficient documentation

## 2015-08-09 DIAGNOSIS — J45909 Unspecified asthma, uncomplicated: Secondary | ICD-10-CM | POA: Diagnosis not present

## 2015-08-09 DIAGNOSIS — M25511 Pain in right shoulder: Secondary | ICD-10-CM | POA: Insufficient documentation

## 2015-08-09 DIAGNOSIS — F329 Major depressive disorder, single episode, unspecified: Secondary | ICD-10-CM | POA: Diagnosis not present

## 2015-08-09 DIAGNOSIS — Z8669 Personal history of other diseases of the nervous system and sense organs: Secondary | ICD-10-CM | POA: Insufficient documentation

## 2015-08-09 DIAGNOSIS — F410 Panic disorder [episodic paroxysmal anxiety] without agoraphobia: Secondary | ICD-10-CM | POA: Diagnosis not present

## 2015-08-09 DIAGNOSIS — E663 Overweight: Secondary | ICD-10-CM | POA: Insufficient documentation

## 2015-08-09 HISTORY — DX: Unspecified asthma, uncomplicated: J45.909

## 2015-08-09 HISTORY — DX: Carpal tunnel syndrome, unspecified upper limb: G56.00

## 2015-08-09 HISTORY — DX: Panic disorder (episodic paroxysmal anxiety): F41.0

## 2015-08-09 MED ORDER — OXYCODONE-ACETAMINOPHEN 5-325 MG PO TABS
1.0000 | ORAL_TABLET | Freq: Once | ORAL | Status: DC
  Filled 2015-08-09: qty 1

## 2015-08-09 MED ORDER — NAPROXEN 500 MG PO TABS: 500.0000 mg | ORAL_TABLET | Freq: Two times a day (BID) | ORAL | Status: DC

## 2015-08-09 MED ORDER — KETOROLAC TROMETHAMINE 60 MG/2ML IM SOLN
60.0000 mg | Freq: Once | INTRAMUSCULAR | Status: DC
  Filled 2015-08-09: qty 2

## 2015-08-09 NOTE — ED Provider Notes (Signed)
CSN: [id number]     Arrival date & time 08/09/15  0609 History   First MD Initiated Contact with Patient 08/09/15 [phone]     Chief Complaint  Patient presents with  . Shoulder Pain     (Consider location/radiation/quality/duration/timing/severity/associated sxs/prior Treatment) HPI  This a 54 year old female with a history of arthritis, hypertension, obstructive sleep apnea who presents with right arm pain. Patient reports that she woke up this morning with right arm pain and difficulty moving her arm secondary to pain. No known injury. She states that she does not think that she slept on that arm wrong. She reports pain in the right shoulder that radiates downward. No worsening of pain with head movement. Denies weakness of the right arm but states that it is painful to move. Current pain is 10 out of 10. She took hydrocodone at home prior to arrival.  Past Medical History  Diagnosis Date  . Arthritis   . Hypertension   . Depression   . Sleep apnea     by clinical history  . Dyspnea     a. Adm 12/2013 for dyspnea/palps: normal stress-pics-only Lexiscan nuc, echo unremarkable with normal EF, d-dimer normal, ruled out for MI.  Marland Kitchen Hyperglycemia   . Elevated lipids   . Morbid obesity   . Panic attacks   . Carpal tunnel syndrome   . Asthma    Past Surgical History  Procedure Laterality Date  . Cesarean section    . Tubal ligation     Family History  Problem Relation Age of Onset  . Coronary artery disease Sister 40    stents  . Depression Sister   . Coronary artery disease Sister   . Heart attack Sister 2    CHF, MI  . Depression Mother   . Alcohol abuse Father   . Schizophrenia Brother   . Alcohol abuse Brother    Social History  Substance Use Topics  . Smoking status: Never Smoker   . Smokeless tobacco: None  . Alcohol Use: No   OB History    No data available     Review of Systems  Musculoskeletal:       Right arm pain  Neurological: Negative for weakness and  numbness.  All other systems reviewed and are negative.     Allergies  Review of patient's allergies indicates no known allergies.  Home Medications   Prior to Admission medications   Medication Sig Start Date End Date Taking? Authorizing Provider  albuterol (PROVENTIL) (2.5 MG/3ML) 0.083% nebulizer solution Take by nebulization every 6 (six) hours as needed for wheezing or shortness of breath.    Historical Provider, MD  ALPRAZolam Prudy Feeler) 0.5 MG tablet Take 1 tablet (0.5 mg total) by mouth 3 (three) times daily. 07/26/15 07/25/16  Myrlene Broker, MD  atorvastatin (LIPITOR) 20 MG tablet Take 20 mg by mouth daily. 11/05/14   Historical Provider, MD  Budesonide-Formoterol Fumarate (SYMBICORT IN) Inhale 2 puffs into the lungs.    Historical Provider, MD  diclofenac (CATAFLAM) 50 MG tablet Take 1 tablet (50 mg total) by mouth 2 (two) times daily. 06/09/15   Vickki Hearing, MD  DULoxetine (CYMBALTA) 60 MG capsule Take one at bedtime 07/26/15   Myrlene Broker, MD  furosemide (LASIX) 40 MG tablet Take 40 mg by mouth daily.    Historical Provider, MD  HYDROcodone-acetaminophen (NORCO) 7.5-325 MG per tablet Take 1 tablet by mouth every 6 (six) hours as needed for moderate pain.    Historical  Provider, MD  naproxen (NAPROSYN) 500 MG tablet Take 1 tablet (500 mg total) by mouth 2 (two) times daily. 08/09/15   Shon Baton, MD  PROAIR HFA 108 (90 BASE) MCG/ACT inhaler  06/27/15   Historical Provider, MD   BP 123/72 mmHg  Pulse 80  Temp(Src) 98.4 F (36.9 C) (Oral)  Resp 16  Ht  (1.702 m)  Wt 272 lb (123.378 kg)  BMI 42.59 kg/m2  SpO2 96%  LMP 07/19/2015 Physical Exam  Constitutional: She is oriented to person, place, and time. She appears well-developed and well-nourished.  Overweight, anxious  HENT:  Head: Normocephalic and atraumatic.  Cardiovascular: Normal rate, regular rhythm and normal heart sounds.   No murmur heard. Pulmonary/Chest: Effort normal and breath sounds  normal. No respiratory distress. She has no wheezes.  Musculoskeletal:  Limited range of motion of the right shoulder secondary pain, patient is able to flex and extend at the elbow and has good grip strength. 2+ radial pulse, no obvious dislocation of the shoulder, no deformities noted  Neurological: She is alert and oriented to person, place, and time.  Skin: Skin is warm and dry.  Psychiatric:  Anxious appearing  Nursing note and vitals reviewed.   ED Course  Procedures (including critical care time) Labs Review Labs Reviewed - No data to display  Imaging Review Dg Shoulder Right  08/09/2015   CLINICAL DATA:  Awoke with right shoulder pain.  Initial encounter.  EXAM: RIGHT SHOULDER - 2+ VIEW  COMPARISON:  None.  FINDINGS: No dislocation or acute fracture.  Mild degenerative spurring about the acromioclavicular joint without subacromial narrowing. Mild marginal spurring at the glenohumeral joint. Limited axillary view but no suspected glenohumeral joint narrowing. No soft tissue ossification.  IMPRESSION: No explanation for acute shoulder pain.   Electronically Signed   By: Marnee Spring M.D.   On: 08/09/2015 07:09   I have personally reviewed and evaluated these images and lab results as part of my medical decision-making.   EKG Interpretation None      MDM   Final diagnoses:  Shoulder pain, acute, right    Patient presents with right shoulder pain. Neurovascularly intact. Awoke with pain after sleeping. She may have a radicular component to the pain but describes pain mostly as aching in her right shoulder. Exam is limited secondary to pain. Patient was given pain medication and x-ray was obtained. X-ray shows some joint spurring but otherwise no abnormalities. Discussed results with the patient. She has follow-up appointment with Dr. Romeo Apple on Thursday for arthritis. We'll have her follow-up with him if pain persists. Patient has hydrocodone at home. Will add naproxen twice a  day.  After history, exam, and medical workup I feel the patient has been appropriately medically screened and is safe for discharge home. Pertinent diagnoses were discussed with the patient. Patient was given return precautions.   Shon Baton, MD 08/10/15 [phone]

## 2015-08-09 NOTE — Discharge Instructions (Signed)
You were seen today for shoulder pain. Your x-ray shows no evidence of fracture. You may have some arthritis and spurring in the joint. He may also have some element of nerve pain. Continue taking hydrocodone at home as needed for pain. You may add naproxen twice daily. Follow-up with orthopedist as scheduled.  Shoulder Pain The shoulder is the joint that connects your arms to your body. The bones that form the shoulder joint include the upper arm bone (humerus), the shoulder blade (scapula), and the collarbone (clavicle). The top of the humerus is shaped like a ball and fits into a rather flat socket on the scapula (glenoid cavity). A combination of muscles and strong, fibrous tissues that connect muscles to bones (tendons) support your shoulder joint and hold the ball in the socket. Small, fluid-filled sacs (bursae) are located in different areas of the joint. They act as cushions between the bones and the overlying soft tissues and help reduce friction between the gliding tendons and the bone as you move your arm. Your shoulder joint allows a wide range of motion in your arm. This range of motion allows you to do things like scratch your back or throw a ball. However, this range of motion also makes your shoulder more prone to pain from overuse and injury. Causes of shoulder pain can originate from both injury and overuse and usually can be grouped in the following four categories:  Redness, swelling, and pain (inflammation) of the tendon (tendinitis) or the bursae (bursitis).  Instability, such as a dislocation of the joint.  Inflammation of the joint (arthritis).  Broken bone (fracture). HOME CARE INSTRUCTIONS   Apply ice to the sore area.  Put ice in a plastic bag.  Place a towel between your skin and the bag.  Leave the ice on for 15-20 minutes, 3-4 times per day for the first 2 days, or as directed by your health care provider.  Stop using cold packs if they do not help with the  pain.  If you have a shoulder sling or immobilizer, wear it as long as your caregiver instructs. Only remove it to shower or bathe. Move your arm as little as possible, but keep your hand moving to prevent swelling.  Squeeze a soft ball or foam pad as much as possible to help prevent swelling.  Only take over-the-counter or prescription medicines for pain, discomfort, or fever as directed by your caregiver. SEEK MEDICAL CARE IF:   Your shoulder pain increases, or new pain develops in your arm, hand, or fingers.  Your hand or fingers become cold and numb.  Your pain is not relieved with medicines. SEEK IMMEDIATE MEDICAL CARE IF:   Your arm, hand, or fingers are numb or tingling.  Your arm, hand, or fingers are significantly swollen or turn white or blue. MAKE SURE YOU:   Understand these instructions.  Will watch your condition.  Will get help right away if you are not doing well or get worse. Document Released: 09/05/2005 Document Revised: 04/12/2014 Document Reviewed: 11/10/2011 [facility] Patient Information 2015 [place], [place]. This information is not intended to replace advice given to you by your health care provider. Make sure you discuss any questions you have with your health care provider.

## 2015-08-09 NOTE — ED Notes (Signed)
Patient states she woke up this morning with right shoulder pain. States she is unable to move right arm.

## 2015-08-11 ENCOUNTER — Ambulatory Visit (INDEPENDENT_AMBULATORY_CARE_PROVIDER_SITE_OTHER): Payer: Medicaid Other | Admitting: Orthopedic Surgery

## 2015-08-11 VITALS — BP 123/91 | Ht 67.0 in | Wt 272.0 lb

## 2015-08-11 DIAGNOSIS — M1712 Unilateral primary osteoarthritis, left knee: Secondary | ICD-10-CM | POA: Diagnosis not present

## 2015-08-13 ENCOUNTER — Encounter: Payer: Self-pay | Admitting: Orthopedic Surgery

## 2015-08-13 MED ORDER — DICLOFENAC POTASSIUM 50 MG PO TABS: 50.0000 mg | ORAL_TABLET | Freq: Two times a day (BID) | ORAL | Status: DC

## 2015-08-13 NOTE — Progress Notes (Signed)
Patient ID: Annette Bryant, female   DOB: 15-Nov-1961, 54 y.o.   MRN: [id number] Patient ID: Annette Bryant, female   DOB: 1961-10-12, 54 y.o.   MRN: [id number]  Follow up visit  Chief Complaint  Patient presents with  . Follow-up    3 month follow up left knee s/p injection + medication    BP 123/91 mmHg  Ht  (1.702 m)  Wt 272 lb (123.378 kg)  BMI 42.59 kg/m2  LMP 07/19/2015  Encounter Diagnosis  Name Primary?  . Primary osteoarthritis of left knee Yes    The patient returns after 3 months after an injection in her left knee and she was started on diclofenac and she has done well with her osteoarthritis. Her pain is now well controlled and she only has moderate discomfort.  Review of systems musculoskeletal no locking catching giving way warmth or swelling in the knee joint.  Vital signs recorded are stable. Her knee looks to be in normal alignment. Her knee flexion is greater than 120. The knee is stable. Motor function of the extensor mechanism is grade 5. Neurovascular exam is intact.  Established problem stable  Continue with diclofenac with a refill for 3 months prescription medication.  Follow-up as needed  Meds ordered this encounter  Medications  . diclofenac (CATAFLAM) 50 MG tablet    Sig: Take 1 tablet (50 mg total) by mouth 2 (two) times daily.    Dispense:  60 tablet    Refill:  2

## 2015-08-25 ENCOUNTER — Other Ambulatory Visit (HOSPITAL_COMMUNITY): Payer: Self-pay | Admitting: Family Medicine

## 2015-08-25 DIAGNOSIS — Z1231 Encounter for screening mammogram for malignant neoplasm of breast: Secondary | ICD-10-CM

## 2015-09-08 ENCOUNTER — Encounter (HOSPITAL_COMMUNITY): Payer: Self-pay | Admitting: Psychiatry

## 2015-09-08 ENCOUNTER — Ambulatory Visit (INDEPENDENT_AMBULATORY_CARE_PROVIDER_SITE_OTHER): Payer: Medicaid Other | Admitting: Psychiatry

## 2015-09-08 DIAGNOSIS — F332 Major depressive disorder, recurrent severe without psychotic features: Secondary | ICD-10-CM
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
## 2015-09-08 NOTE — Patient Instructions (Signed)
Discussed orally 

## 2015-09-15 ENCOUNTER — Ambulatory Visit (HOSPITAL_COMMUNITY)
Admission: RE | Admit: 2015-09-15 | Discharge: 2015-09-15 | Disposition: A | Discharge status: Home / Self Care | Payer: Medicaid Other | Source: Ambulatory Visit | Attending: Family Medicine | Admitting: Family Medicine

## 2015-09-15 DIAGNOSIS — Z1231 Encounter for screening mammogram for malignant neoplasm of breast: Secondary | ICD-10-CM | POA: Insufficient documentation

## 2015-09-29 ENCOUNTER — Ambulatory Visit (INDEPENDENT_AMBULATORY_CARE_PROVIDER_SITE_OTHER): Payer: Medicaid Other

## 2015-09-29 ENCOUNTER — Encounter: Payer: Self-pay | Admitting: Orthopedic Surgery

## 2015-09-29 ENCOUNTER — Ambulatory Visit (INDEPENDENT_AMBULATORY_CARE_PROVIDER_SITE_OTHER): Payer: Medicaid Other | Admitting: Orthopedic Surgery

## 2015-09-29 VITALS — BP 132/87 | Ht 67.0 in | Wt 272.0 lb

## 2015-09-29 DIAGNOSIS — M25561 Pain in right knee: Secondary | ICD-10-CM | POA: Diagnosis not present

## 2015-09-29 DIAGNOSIS — M1711 Unilateral primary osteoarthritis, right knee: Secondary | ICD-10-CM | POA: Diagnosis not present

## 2015-09-29 NOTE — Patient Instructions (Signed)
Joint Injection  Care After  Refer to this sheet in the next few days. These instructions provide you with information on caring for yourself after you have had a joint injection. Your caregiver also may give you more specific instructions. Your treatment has been planned according to current medical practices, but problems sometimes occur. Call your caregiver if you have any problems or questions after your procedure.  After any type of joint injection, it is not uncommon to experience:  · Soreness, swelling, or bruising around the injection site.  · Mild numbness, tingling, or weakness around the injection site caused by the numbing medicine used before or with the injection.  It also is possible to experience the following effects associated with the specific agent after injection:  · Iodine-based contrast agents:  ¨ Allergic reaction (itching, hives, widespread redness, and swelling beyond the injection site).  · Corticosteroids (These effects are rare.):  ¨ Allergic reaction.  ¨ Increased blood sugar levels (If you have diabetes and you notice that your blood sugar levels have increased, notify your caregiver).  ¨ Increased blood pressure levels.  ¨ Mood swings.  · Hyaluronic acid in the use of viscosupplementation.  ¨ Temporary heat or redness.  ¨ Temporary rash and itching.  ¨ Increased fluid accumulation in the injected joint.  These effects all should resolve within a day after your procedure.   HOME CARE INSTRUCTIONS  · Limit yourself to light activity the day of your procedure. Avoid lifting heavy objects, bending, stooping, or twisting.  · Take prescription or over-the-counter pain medication as directed by your caregiver.  · You may apply ice to your injection site to reduce pain and swelling the day of your procedure. Ice may be applied 03-04 times:  ¨ Put ice in a plastic bag.  ¨ Place a towel between your skin and the bag.  ¨ Leave the ice on for no longer than 15-20 minutes each time.  SEEK  IMMEDIATE MEDICAL CARE IF:   · Pain and swelling get worse rather than better or extend beyond the injection site.  · Numbness does not go away.  · Blood or fluid continues to leak from the injection site.  · You have chest pain.  · You have swelling of your face or tongue.  · You have trouble breathing or you become dizzy.  · You develop a fever, chills, or severe tenderness at the injection site that last longer than 1 day.  MAKE SURE YOU:  · Understand these instructions.  · Watch your condition.  · Get help right away if you are not doing well or if you get worse.  Document Released: 08/09/2011 Document Revised: 02/18/2012 Document Reviewed: 08/09/2011  ExitCare® Patient Information ©2015 [employer]. This information is not intended to replace advice given to you by your health care provider. Make sure you discuss any questions you have with your health care provider.

## 2015-09-29 NOTE — Progress Notes (Signed)
Patient ID: Annette Bryant, female   DOB: 01/19/1961, 54 y.o.   MRN: [id number]  Chief Complaint  Patient presents with  . Knee Pain    right knee pain, no known injury, referred by Michelle Nasuti   She is an established patient with a new problem HPI Annette Bryant is a 54 y.o. female.  She presents for several months history of pain in her right knee with no trauma. She complains of 6 out of 10 aching pain diffusely about the right knee for the last few months. Treatment naproxen and diclofenac occasionally making it better but not complete relief.  Review of systems panic anxiety attack depression. History of degenerative disc and back pain but nonradicular sleep apnea asthma vision disturbance.  Review of Systems Review of Systems  Past Medical History  Diagnosis Date  . Arthritis   . Hypertension   . Depression   . Sleep apnea     by clinical history  . Dyspnea     a. Adm 12/2013 for dyspnea/palps: normal stress-pics-only Lexiscan nuc, echo unremarkable with normal EF, d-dimer normal, ruled out for MI.  Marland Kitchen Hyperglycemia   . Elevated lipids   . Morbid obesity   . Panic attacks   . Carpal tunnel syndrome   . Asthma     Past Surgical History  Procedure Laterality Date  . Cesarean section    . Tubal ligation      Family History  Problem Relation Age of Onset  . Coronary artery disease Sister 40    stents  . Depression Sister   . Coronary artery disease Sister   . Heart attack Sister 61    CHF, MI  . Depression Mother   . Alcohol abuse Father   . Schizophrenia Brother   . Alcohol abuse Brother     Social History Social History  Substance Use Topics  . Smoking status: Never Smoker   . Smokeless tobacco: Not on file  . Alcohol Use: No    No Known Allergies  Current Outpatient Prescriptions  Medication Sig Dispense Refill  . albuterol (PROVENTIL) (2.5 MG/3ML) 0.083% nebulizer solution Take by nebulization every 6 (six) hours as needed for wheezing or shortness  of breath.    . ALPRAZolam (XANAX) 0.5 MG tablet Take 1 tablet (0.5 mg total) by mouth 3 (three) times daily. 90 tablet 2  . atorvastatin (LIPITOR) 20 MG tablet Take 20 mg by mouth daily.  11  . Budesonide-Formoterol Fumarate (SYMBICORT IN) Inhale 2 puffs into the lungs.    . diclofenac (CATAFLAM) 50 MG tablet Take 1 tablet (50 mg total) by mouth 2 (two) times daily. 60 tablet 2  . DULoxetine (CYMBALTA) 60 MG capsule Take one at bedtime 30 capsule 2  . furosemide (LASIX) 40 MG tablet Take 40 mg by mouth daily.    Marland Kitchen HYDROcodone-acetaminophen (NORCO) 7.5-325 MG per tablet Take 1 tablet by mouth every 6 (six) hours as needed for moderate pain.    . naproxen (NAPROSYN) 500 MG tablet Take 1 tablet (500 mg total) by mouth 2 (two) times daily. 30 tablet 0  . PROAIR HFA 108 (90 BASE) MCG/ACT inhaler      No current facility-administered medications for this visit.       Physical Exam Physical Exam Blood pressure 132/87, height  (1.702 m), weight 272 lb (123.378 kg), last menstrual period 08/16/2015. Appearance, there are no abnormalities in terms of appearance the patient was well-developed and well-nourished. The grooming and hygiene were normal.  Mental status orientation, there was normal alertness and orientation Mood pleasant Ambulatory status normal with no assistive devices  Examination of the right knee knee Inspection medial and lateral joint lines are tender quadriceps and patellar tendons nontender Range of motion she has normal flexion up to 120 and then pain Tests for stability were normal Motor strength  grade 5 strength in the quadriceps Skin warm dry and intact without laceration or ulceration or erythema Neurologic examination normal sensation Vascular examination normal pulses with warm extremity and normal capillary refill  The opposite knee reveals no swelling or effusion, full range of motion, normal muscle tone, anterior and posterior drawer test stable  neurovascular exam intact  Data Reviewed I interpret the x-ray that I ordered as some mild degenerative changes primarily patellofemoral joint mild tibiofemoral joint normal alignment  Assessment  Knee pain with probable osteoarthritis and joint synovitis   Plan  Injection follow-up as needed  Procedure note right knee injection verbal consent was obtained to inject right knee joint  Timeout was completed to confirm the site of injection  The medications used were 40 mg of Depo-Medrol and 1% lidocaine 3 cc  Anesthesia was provided by ethyl chloride and the skin was prepped with alcohol.  After cleaning the skin with alcohol a 20-gauge needle was used to inject the right knee joint. There were no complications. A sterile bandage was applied.

## 2015-10-04 ENCOUNTER — Encounter (HOSPITAL_COMMUNITY): Payer: Self-pay | Admitting: Psychiatry

## 2015-10-04 ENCOUNTER — Ambulatory Visit (INDEPENDENT_AMBULATORY_CARE_PROVIDER_SITE_OTHER): Payer: Medicaid Other | Admitting: Psychiatry

## 2015-10-04 DIAGNOSIS — F332 Major depressive disorder, recurrent severe without psychotic features: Secondary | ICD-10-CM | POA: Diagnosis not present
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
## 2015-10-04 NOTE — Patient Instructions (Signed)
Discussed orally 

## 2015-10-06 ENCOUNTER — Ambulatory Visit (HOSPITAL_COMMUNITY): Payer: Self-pay | Admitting: Psychiatry

## 2015-10-25 ENCOUNTER — Encounter (HOSPITAL_COMMUNITY): Payer: Self-pay | Admitting: Psychiatry

## 2015-10-25 ENCOUNTER — Ambulatory Visit (INDEPENDENT_AMBULATORY_CARE_PROVIDER_SITE_OTHER): Payer: Medicaid Other | Admitting: Psychiatry

## 2015-10-25 VITALS — BP 124/80 | Ht 67.0 in | Wt 271.0 lb

## 2015-10-25 DIAGNOSIS — F332 Major depressive disorder, recurrent severe without psychotic features: Secondary | ICD-10-CM | POA: Diagnosis not present

## 2015-10-25 DIAGNOSIS — F4310 Post-traumatic stress disorder, unspecified: Secondary | ICD-10-CM

## 2015-10-25 MED ORDER — DULOXETINE HCL 60 MG PO CPEP: 60.0000 mg | ORAL_CAPSULE | Freq: Two times a day (BID) | ORAL | Status: DC

## 2015-10-25 MED ORDER — ALPRAZOLAM 0.5 MG PO TABS: 0.5000 mg | ORAL_TABLET | Freq: Three times a day (TID) | ORAL | Status: DC
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
## 2015-10-27 ENCOUNTER — Ambulatory Visit (HOSPITAL_COMMUNITY): Payer: Self-pay | Admitting: Psychiatry

## 2015-11-24 ENCOUNTER — Ambulatory Visit (HOSPITAL_COMMUNITY): Payer: Self-pay | Admitting: Psychiatry

## 2015-12-20 ENCOUNTER — Ambulatory Visit (INDEPENDENT_AMBULATORY_CARE_PROVIDER_SITE_OTHER): Payer: Medicaid Other | Admitting: Psychiatry

## 2015-12-20 ENCOUNTER — Encounter (HOSPITAL_COMMUNITY): Payer: Self-pay | Admitting: Psychiatry

## 2015-12-20 DIAGNOSIS — F332 Major depressive disorder, recurrent severe without psychotic features: Secondary | ICD-10-CM
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
## 2015-12-20 NOTE — Patient Instructions (Signed)
Discussed orally 

## 2015-12-22 ENCOUNTER — Ambulatory Visit (HOSPITAL_COMMUNITY): Payer: Self-pay | Admitting: Psychiatry

## 2016-01-05 ENCOUNTER — Ambulatory Visit (INDEPENDENT_AMBULATORY_CARE_PROVIDER_SITE_OTHER): Payer: Medicaid Other | Admitting: Psychiatry

## 2016-01-05 ENCOUNTER — Encounter (HOSPITAL_COMMUNITY): Payer: Self-pay | Admitting: Psychiatry

## 2016-01-05 VITALS — BP 130/74 | HR 83 | Ht 67.0 in | Wt 265.8 lb

## 2016-01-05 DIAGNOSIS — F332 Major depressive disorder, recurrent severe without psychotic features: Secondary | ICD-10-CM

## 2016-01-05 DIAGNOSIS — F4310 Post-traumatic stress disorder, unspecified: Secondary | ICD-10-CM | POA: Diagnosis not present

## 2016-01-05 MED ORDER — DULOXETINE HCL 60 MG PO CPEP: 60.0000 mg | ORAL_CAPSULE | Freq: Two times a day (BID) | ORAL | Status: DC
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
## 2016-01-16 ENCOUNTER — Emergency Department (HOSPITAL_COMMUNITY)
Admission: EM | Admit: 2016-01-16 | Discharge: 2016-01-16 | Disposition: A | Discharge status: Home / Self Care | Payer: Medicaid Other | Attending: Emergency Medicine | Admitting: Emergency Medicine

## 2016-01-16 ENCOUNTER — Encounter (HOSPITAL_COMMUNITY): Payer: Self-pay | Admitting: Emergency Medicine

## 2016-01-16 DIAGNOSIS — I10 Essential (primary) hypertension: Secondary | ICD-10-CM | POA: Diagnosis not present

## 2016-01-16 DIAGNOSIS — J449 Chronic obstructive pulmonary disease, unspecified: Secondary | ICD-10-CM | POA: Diagnosis not present

## 2016-01-16 DIAGNOSIS — M79641 Pain in right hand: Secondary | ICD-10-CM | POA: Insufficient documentation

## 2016-01-16 DIAGNOSIS — R252 Cramp and spasm: Secondary | ICD-10-CM | POA: Insufficient documentation

## 2016-01-16 DIAGNOSIS — R202 Paresthesia of skin: Secondary | ICD-10-CM | POA: Diagnosis not present

## 2016-01-16 DIAGNOSIS — M79642 Pain in left hand: Secondary | ICD-10-CM | POA: Insufficient documentation

## 2016-01-16 HISTORY — DX: Chronic obstructive pulmonary disease, unspecified: J44.9

## 2016-01-16 NOTE — ED Notes (Signed)
Pt reports "pins and needles" sensation in her thoracic region of her back, symptoms present x 2 years. Pt also c/o cramps in both hands x 3 days.

## 2016-01-16 NOTE — ED Notes (Signed)
Per registration pt left facility. 

## 2016-01-17 ENCOUNTER — Encounter (HOSPITAL_COMMUNITY): Payer: Self-pay | Admitting: Psychiatry

## 2016-01-17 ENCOUNTER — Ambulatory Visit (INDEPENDENT_AMBULATORY_CARE_PROVIDER_SITE_OTHER): Payer: Medicaid Other | Admitting: Psychiatry

## 2016-01-17 DIAGNOSIS — F332 Major depressive disorder, recurrent severe without psychotic features: Secondary | ICD-10-CM | POA: Diagnosis not present
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
## 2016-01-17 NOTE — Patient Instructions (Signed)
Discussed orally 

## 2016-01-19 ENCOUNTER — Other Ambulatory Visit: Payer: Self-pay | Admitting: *Deleted

## 2016-01-19 ENCOUNTER — Ambulatory Visit: Payer: Medicaid Other

## 2016-01-19 ENCOUNTER — Ambulatory Visit (INDEPENDENT_AMBULATORY_CARE_PROVIDER_SITE_OTHER): Payer: Medicaid Other | Admitting: Orthopaedic Surgery

## 2016-01-19 ENCOUNTER — Encounter: Payer: Self-pay | Admitting: Orthopaedic Surgery

## 2016-01-19 VITALS — BP 146/90 | HR 94 | Temp 97.5°F | Ht 67.0 in | Wt 264.2 lb

## 2016-01-19 DIAGNOSIS — M79642 Pain in left hand: Secondary | ICD-10-CM | POA: Diagnosis not present

## 2016-01-19 DIAGNOSIS — M79641 Pain in right hand: Secondary | ICD-10-CM

## 2016-01-19 NOTE — Progress Notes (Signed)
Patient GN:Annette Bryant, female DOB:09/16/1961, 55 y.o. QMV:[id number]  Chief Complaint  Patient presents with  . Bilateral hand pain and numbness    Right worse than left    HPI  Annette Bryant is a 55 y.o. female who has bilateral hand pain and has some cramping in the hands.  She has panic attacks.   She has history of marked anxiety and depression.  She has hypertension well controlled.   Wrist Pain  The pain is present in the right wrist and left wrist. This is a chronic problem. The current episode started more than 1 year ago. There has been no history of extremity trauma. The problem occurs intermittently. The problem has been gradually worsening. The quality of the pain is described as aching and dull. The pain is at a severity of 4/10. The pain is moderate. The symptoms are aggravated by activity and cold. She has tried acetaminophen, cold, heat, NSAIDS and rest for the symptoms. The treatment provided no relief.    Body mass index is 41.37 kg/(m^2).  Review of Systems  Patient does not have Diabetes Mellitus. She has elevated blood sugars at times but is considered borderline diabetic. Patient has hypertension. Patient has COPD or shortness of breath. Patient has BMI > 35. Patient does not have current smoking history.  Review of Systems  Eyes: Positive for visual disturbance.  Respiratory: Positive for shortness of breath.   Cardiovascular: Positive for palpitations.  Musculoskeletal: Positive for back pain, joint swelling (both hands and wrists), arthralgias and neck pain.  Neurological: Positive for light-headedness.  Psychiatric/Behavioral: The patient is nervous/anxious.     Past Medical History  Diagnosis Date  . Arthritis   . Hypertension   . Depression   . Sleep apnea     by clinical history  . Dyspnea     a. Adm 12/2013 for dyspnea/palps: normal stress-pics-only Lexiscan nuc, echo unremarkable with normal EF, d-dimer normal, ruled out for MI.  Marland Kitchen  Hyperglycemia   . Elevated lipids   . Morbid obesity (HCC)   . Panic attacks   . Carpal tunnel syndrome   . Asthma   . COPD (chronic obstructive pulmonary disease) [facility])     Past Surgical History  Procedure Laterality Date  . Cesarean section    . Tubal ligation      Family History  Problem Relation Age of Onset  . Coronary artery disease Sister 40    stents  . Depression Sister   . Coronary artery disease Sister   . Heart attack Sister 59    CHF, MI  . Depression Mother   . Alcohol abuse Father   . Schizophrenia Brother   . Alcohol abuse Brother     Social History Social History  Substance Use Topics  . Smoking status: Never Smoker   . Smokeless tobacco: None  . Alcohol Use: No    No Known Allergies  Current Outpatient Prescriptions  Medication Sig Dispense Refill  . albuterol (PROVENTIL) (2.5 MG/3ML) 0.083% nebulizer solution Take by nebulization every 6 (six) hours as needed for wheezing or shortness of breath.    . ALPRAZolam (XANAX) 1 MG tablet Take 1 mg by mouth 2 (two) times daily.    Marland Kitchen atorvastatin (LIPITOR) 20 MG tablet Take 20 mg by mouth daily.  11  . Budesonide-Formoterol Fumarate (SYMBICORT IN) Inhale 2 puffs into the lungs.    . diclofenac (CATAFLAM) 50 MG tablet Take 1 tablet (50 mg total) by mouth 2 (two) times daily.  60 tablet 2  . DULoxetine (CYMBALTA) 60 MG capsule Take 1 capsule (60 mg total) by mouth 2 (two) times daily. Take one at bedtime 60 capsule 2  . furosemide (LASIX) 40 MG tablet Take 40 mg by mouth daily.    Marland Kitchen HYDROcodone-acetaminophen (NORCO) 7.5-325 MG per tablet Take 1 tablet by mouth every 6 (six) hours as needed for moderate pain.    . naproxen (NAPROSYN) 500 MG tablet Take 1 tablet (500 mg total) by mouth 2 (two) times daily. (Patient taking differently: Take 500 mg by mouth 2 (two) times daily as needed. ) 30 tablet 0  . PROAIR HFA 108 (90 BASE) MCG/ACT inhaler      No current facility-administered medications for this visit.      Physical Exam  Blood pressure 146/90, pulse 94, temperature 97.5 F (36.4 C), height  (1.702 m), weight 264 lb 3.2 oz (119.84 kg), last menstrual period 01/03/2016.  Constitutional: overall normal hygiene, normal nutrition, well developed, normal grooming, normal body habitus. Assistive device:none  Musculoskeletal: gait and station Limp none, muscle tone and strength are normal, no tremors or atrophy is present.  .  Neurological: coordination overall normal.  Deep tendon reflex/nerve stretch intact.  Sensation normal.  Cranial nerves II-XII intact.   Skin:normal overall no scars, lesions, ulcers or rash es. No psoriasis.  Psychiatric: Alert and oriented x 3.  Recent memory intact, remote memory unclear.  Normal mood and affect. Well groomed.  Good eye contact.  Cardiovascular: overall no swelling, no varicosities, no edema bilaterally, normal temperatures of the legs and arms, no clubbing, cyanosis and good capillary refill.  Lymphatic: palpation is normal.   Extremities:she has pain in both hands with positive Tinel and Phalen signs.  She has no redness, no wounds.  She has decreased sensation in median nerve both hands.  She has no swelling. Inspection normal Strength and tone normal Range of motion normal both hands  Additional services performed: she is extremely anxious.  She has history of panic attacks.  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  I told her to try to ignore this.  It is bothering her too much and she gets very anxious from it.  She has back pain and neck pain but first I will evaluate her hand pains.  PLAN Call if any problems.  Precautions discussed.  Continue current medications.   Return to clinic after obtaining EMGs

## 2016-01-19 NOTE — Patient Instructions (Addendum)
We will refer you to neurology for nerve conduction testing  Continue current medication

## 2016-01-20 ENCOUNTER — Telehealth: Payer: Self-pay | Admitting: *Deleted

## 2016-01-20 NOTE — Telephone Encounter (Signed)
DR Hilda Lias RECOMMENDS REFERRAL TO DR [facility] FOR EMGS FOR BILATERAL HAND PAIN  PATIENTS INSURANCE REQUIRES REFERRAL FROM PCP  NOTE AND RECOMMENDATION FAXED TO DR Sudie Bailey

## 2016-02-06 ENCOUNTER — Ambulatory Visit (INDEPENDENT_AMBULATORY_CARE_PROVIDER_SITE_OTHER): Payer: Medicaid Other | Admitting: Psychiatry

## 2016-02-06 ENCOUNTER — Encounter (HOSPITAL_COMMUNITY): Payer: Self-pay | Admitting: Psychiatry

## 2016-02-06 DIAGNOSIS — F332 Major depressive disorder, recurrent severe without psychotic features: Secondary | ICD-10-CM | POA: Diagnosis not present

## 2016-02-06 NOTE — Patient Instructions (Signed)
Discussed orally 
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
## 2016-02-27 ENCOUNTER — Encounter (HOSPITAL_COMMUNITY): Payer: Self-pay | Admitting: Emergency Medicine

## 2016-02-27 ENCOUNTER — Emergency Department (HOSPITAL_COMMUNITY)
Admission: EM | Admit: 2016-02-27 | Discharge: 2016-02-27 | Disposition: A | Discharge status: Home / Self Care | Payer: Medicaid Other | Attending: Emergency Medicine | Admitting: Emergency Medicine

## 2016-02-27 DIAGNOSIS — R55 Syncope and collapse: Secondary | ICD-10-CM | POA: Insufficient documentation

## 2016-02-27 DIAGNOSIS — F329 Major depressive disorder, single episode, unspecified: Secondary | ICD-10-CM | POA: Diagnosis not present

## 2016-02-27 DIAGNOSIS — J45909 Unspecified asthma, uncomplicated: Secondary | ICD-10-CM | POA: Diagnosis not present

## 2016-02-27 DIAGNOSIS — J449 Chronic obstructive pulmonary disease, unspecified: Secondary | ICD-10-CM | POA: Insufficient documentation

## 2016-02-27 DIAGNOSIS — I10 Essential (primary) hypertension: Secondary | ICD-10-CM | POA: Diagnosis not present

## 2016-02-27 DIAGNOSIS — M1990 Unspecified osteoarthritis, unspecified site: Secondary | ICD-10-CM | POA: Diagnosis not present

## 2016-02-27 DIAGNOSIS — Z79899 Other long term (current) drug therapy: Secondary | ICD-10-CM | POA: Insufficient documentation

## 2016-02-27 LAB — CBG MONITORING, ED: Glucose-Capillary: 107 mg/dL — ABNORMAL HIGH (ref 65–99)

## 2016-02-27 NOTE — ED Notes (Signed)
Pt refusing EKG. Stating she is ready to go home because she is feeling better. Pt suggested to stay and speak with doctor before leaving.

## 2016-02-27 NOTE — ED Notes (Signed)
MD at bedside. 
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
## 2016-02-27 NOTE — ED Notes (Signed)
Pt states she was at work when she had a sudden onset of dizziness and hot flashes. Pt states she was in a very hot room when this occurred. Pt requesting for blood sugar to be checked. States that she is a borderline diabetic.

## 2016-02-27 NOTE — Discharge Instructions (Signed)

## 2016-03-02 ENCOUNTER — Encounter (HOSPITAL_COMMUNITY): Payer: Self-pay | Admitting: Psychiatry

## 2016-03-02 ENCOUNTER — Ambulatory Visit (INDEPENDENT_AMBULATORY_CARE_PROVIDER_SITE_OTHER): Payer: Medicaid Other | Admitting: Psychiatry

## 2016-03-02 VITALS — BP 136/78 | HR 93 | Ht 67.0 in | Wt 261.4 lb

## 2016-03-02 DIAGNOSIS — F4310 Post-traumatic stress disorder, unspecified: Secondary | ICD-10-CM | POA: Diagnosis not present

## 2016-03-02 DIAGNOSIS — F332 Major depressive disorder, recurrent severe without psychotic features: Secondary | ICD-10-CM

## 2016-03-02 MED ORDER — DULOXETINE HCL 60 MG PO CPEP: 60.0000 mg | ORAL_CAPSULE | Freq: Two times a day (BID) | ORAL | Status: DC

## 2016-03-02 MED ORDER — ALPRAZOLAM 1 MG PO TABS: 1.0000 mg | ORAL_TABLET | Freq: Two times a day (BID) | ORAL | Status: DC
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
## 2016-03-05 ENCOUNTER — Ambulatory Visit (INDEPENDENT_AMBULATORY_CARE_PROVIDER_SITE_OTHER): Payer: Medicaid Other | Admitting: Psychiatry

## 2016-03-05 DIAGNOSIS — F332 Major depressive disorder, recurrent severe without psychotic features: Secondary | ICD-10-CM | POA: Diagnosis not present
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
## 2016-03-05 NOTE — Patient Instructions (Signed)
Discussed orally 

## 2016-03-08 ENCOUNTER — Ambulatory Visit (INDEPENDENT_AMBULATORY_CARE_PROVIDER_SITE_OTHER): Payer: Medicaid Other | Admitting: Orthopaedic Surgery

## 2016-03-08 ENCOUNTER — Encounter: Payer: Self-pay | Admitting: Orthopaedic Surgery

## 2016-03-08 VITALS — BP 143/84 | HR 89 | Temp 97.7°F | Ht 67.0 in | Wt 259.0 lb

## 2016-03-08 DIAGNOSIS — G5601 Carpal tunnel syndrome, right upper limb: Secondary | ICD-10-CM | POA: Diagnosis not present

## 2016-03-08 NOTE — Progress Notes (Signed)
Patient ZO:XWRUEA:Annette Bryant, female DOB:Oct 16, 1961, 55 y.o. VWU:[id number]  Chief Complaint  Patient presents with  . Results    EMG RESULTS, Right wrist pain    HPI  Annette Bryant is a 55 y.o. female who is in for follow-up for right wrist pain.  She had the EMGs.  I have reviewed the report from Dr. Gerilyn Pilgrimoonquah.  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  She will need surgery on the right hand.  She says her work situation will not allow that now but she will check.  I will have her see Dr. Romeo AppleHarrison.  If she cannot have surgery, an injection might help.  She is agreeable to this. She continues to have paresthesias in the median nerve distribution at night and now doing the day at times.   HPI  Body mass index is 40.56 kg/(m^2).  Review of Systems  Eyes: Positive for visual disturbance.  Respiratory: Positive for shortness of breath.   Cardiovascular: Positive for palpitations.  Musculoskeletal: Positive for back pain, joint swelling (both hands and wrists), arthralgias and neck pain.  Neurological: Positive for light-headedness.  Psychiatric/Behavioral: The patient is nervous/anxious.     Past Medical History  Diagnosis Date  . Arthritis   . Hypertension   . Depression   . Sleep apnea     by clinical history  . Dyspnea     a. Adm 12/2013 for dyspnea/palps: normal stress-pics-only Lexiscan nuc, echo unremarkable with normal EF, d-dimer normal, ruled out for MI.  Marland Kitchen. Hyperglycemia   . Elevated lipids   . Morbid obesity (HCC)   . Panic attacks   . Carpal tunnel syndrome   . Asthma   . COPD (chronic obstructive pulmonary disease) [facility](HCC)     Past Surgical History  Procedure Laterality Date  . Cesarean section    . Tubal ligation      Family History  Problem Relation Age of Onset  . Coronary artery disease Sister 40    stents  . Depression Sister   . Coronary artery disease Sister   . Heart attack Sister  3854    CHF, MI  . Depression Mother   . Alcohol abuse Father   . Schizophrenia Brother   . Alcohol abuse Brother     Social History Social History  Substance Use Topics  . Smoking status: Never Smoker   . Smokeless tobacco: None  . Alcohol Use: No    No Known Allergies  Current Outpatient Prescriptions  Medication Sig Dispense Refill  . albuterol (PROVENTIL) (2.5 MG/3ML) 0.083% nebulizer solution Take by nebulization every 6 (six) hours as needed for wheezing or shortness of breath.    . ALPRAZolam (XANAX) 1 MG tablet Take 1 tablet (1 mg total) by mouth 2 (two) times daily. 60 tablet 2  . atorvastatin (LIPITOR) 20 MG tablet Take 20 mg by mouth daily.  11  . Budesonide-Formoterol Fumarate (SYMBICORT IN) Inhale 2 puffs into the lungs.    . diclofenac (CATAFLAM) 50 MG tablet Take 1 tablet (50 mg total) by mouth 2 (two) times daily. 60 tablet 2  . DULoxetine (CYMBALTA) 60 MG capsule Take 1 capsule (60 mg total) by mouth 2 (two) times daily. 60 capsule 2  . furosemide (LASIX) 40 MG tablet Take 40 mg by mouth daily.    Marland Kitchen. HYDROcodone-acetaminophen (NORCO) 7.5-325 MG per tablet Take 1 tablet by mouth every 6 (six) hours as needed for moderate pain.    . naproxen (NAPROSYN)  500 MG tablet Take 1 tablet (500 mg total) by mouth 2 (two) times daily. (Patient taking differently: Take 500 mg by mouth 2 (two) times daily as needed. ) 30 tablet 0  . PROAIR HFA 108 (90 BASE) MCG/ACT inhaler      No current facility-administered medications for this visit.     Physical Exam  Blood pressure 143/84, pulse 89, temperature 97.7 F (36.5 C), height  (1.702 m), weight 259 lb (117.482 kg), last menstrual period 01/23/2016.  Constitutional: overall normal hygiene, normal nutrition, well developed, normal grooming, normal body habitus. Assistive device:none  Musculoskeletal: gait and station Limp none, muscle tone and strength are normal, no tremors or atrophy is present.  .  Neurological:  coordination overall normal.  Deep tendon reflex/nerve stretch intact.  Sensation normal.  Cranial nerves II-XII intact.   Skin:   normal overall no scars, lesions, ulcers or rashes. No psoriasis.  Psychiatric: Alert and oriented x 3.  Recent memory intact, remote memory unclear.  Normal mood and affect. Well groomed.  Good eye contact.  Cardiovascular: overall no swelling, no varicosities, no edema bilaterally, normal temperatures of the legs and arms, no clubbing, cyanosis and good capillary refill.  Lymphatic: palpation is normal.   Extremities:she has positive Tinel and Phalen on the right wrist.  She has no redness or swelling Inspection normal right hand and wrist Strength and tone normal Range of motion full of the right wrist and fingers.  The patient has been educated about the nature of the problem(s) and counseled on treatment options.  The patient appeared to understand what I have discussed and is in agreement with it.  Encounter Diagnosis  Name Primary?  . Carpal tunnel syndrome, right Yes    PLAN Call if any problems.  Precautions discussed.  Continue current medications.   Return to clinic To see Dr. Romeo Apple

## 2016-03-27 ENCOUNTER — Ambulatory Visit (HOSPITAL_COMMUNITY): Payer: Self-pay | Admitting: Psychiatry

## 2016-03-29 ENCOUNTER — Ambulatory Visit: Payer: Self-pay | Admitting: Orthopedic Surgery

## 2016-03-29 ENCOUNTER — Encounter: Payer: Self-pay | Admitting: Orthopedic Surgery

## 2016-03-30 ENCOUNTER — Encounter: Payer: Self-pay | Admitting: Orthopedic Surgery

## 2016-03-30 ENCOUNTER — Encounter (HOSPITAL_COMMUNITY): Payer: Self-pay | Admitting: Psychiatry

## 2016-03-30 ENCOUNTER — Ambulatory Visit (INDEPENDENT_AMBULATORY_CARE_PROVIDER_SITE_OTHER): Payer: Medicaid Other | Admitting: Psychiatry

## 2016-03-30 DIAGNOSIS — F332 Major depressive disorder, recurrent severe without psychotic features: Secondary | ICD-10-CM | POA: Diagnosis not present

## 2016-03-30 NOTE — Patient Instructions (Signed)
Discussed orally 
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
## 2016-04-12 ENCOUNTER — Ambulatory Visit (INDEPENDENT_AMBULATORY_CARE_PROVIDER_SITE_OTHER): Payer: Medicaid Other | Admitting: Orthopedic Surgery

## 2016-04-12 VITALS — BP 136/85 | HR 98 | Ht 67.0 in | Wt 264.0 lb

## 2016-04-12 DIAGNOSIS — G5601 Carpal tunnel syndrome, right upper limb: Secondary | ICD-10-CM | POA: Diagnosis not present

## 2016-04-12 NOTE — Progress Notes (Signed)
Patient ID: Annette KearnsSandra A Mancia, female   DOB: 1961/03/05, 55 y.o.   MRN: [id number]  Chief Complaint  Patient presents with  . Wrist Pain    Referred by Dr. Sudie BaileyKnowlton for hand swelling    HPI - 55 yo female with Carpal tunnel syndrome had nerve conduction studies already. Does not want have surgery at this time so we offered her injection and she agreed. She was having severe pain over the right carpal tunnel she is having numbness and tingling in the right hand she was having pain at night and some weakness and dropping objects as well.  Review of Systems  Constitutional: Negative for fever and chills.  Musculoskeletal: Positive for back pain.  Endo/Heme/Allergies: Negative for environmental allergies and polydipsia.     Past Medical History  Diagnosis Date  . Arthritis   . Hypertension   . Depression   . Sleep apnea     by clinical history  . Dyspnea     a. Adm 12/2013 for dyspnea/palps: normal stress-pics-only Lexiscan nuc, echo unremarkable with normal EF, d-dimer normal, ruled out for MI.  Marland Kitchen. Hyperglycemia   . Elevated lipids   . Morbid obesity (HCC)   . Panic attacks   . Carpal tunnel syndrome   . Asthma   . COPD (chronic obstructive pulmonary disease) (HCC)      BP 136/85 mmHg  Pulse 98  Ht 5\' 7"  (1.702 m)  Wt 264 lb (119.75 kg)  BMI 41.34 kg/m2  Physical Exam Physical Exam  Constitutional: The patient is oriented to person, place, and time.  The patient appears well-developed and well-nourished. No distress.   Coordination normal.  by finger to nose Skin: Skin is warm and dry. No rash noted. The patient is not diaphoretic. No erythema. No pallor.  Psychiatric: The patient has a normal mood and affect. Her behavior is normal. Judgment and thought content normal.   Neurologic exam she has loss of sensation in the ring and long index and thumb right hand. She has a good pulse in the radial arteries 2+ and good capillary refill less than 2 second seconds all  fingers  Ortho Exam Left wrist and hand no tenderness and no numbness normal capillary refill Grip strength seems normal wrist stability is normal range of motion is normal skin is normal as stated epitrochlear lymph nodes are negative tenderness is mild over the carpal tunnel  ASSESSMENT AND PLAN   Carpal tunnel syndrome right wrist positive nerve studies. Patient does not want surgery once injection.  Right Carpal tunnel injection  Diagnosis right carpal tunnel syndrome  Medications Depo-Medrol 40 mg. 1% lidocaine 3 mL. Alcohol prep for the skin along with ethyl chloride for anesthesia  Verbal consent  Time out to confirm site of injection  using a 25-gauge needle, the right carpal tunnel was injected.  There were no complications  Appropriate precautions were given after injection

## 2016-04-20 ENCOUNTER — Encounter (HOSPITAL_COMMUNITY): Payer: Self-pay | Admitting: Psychiatry

## 2016-04-20 ENCOUNTER — Ambulatory Visit (INDEPENDENT_AMBULATORY_CARE_PROVIDER_SITE_OTHER): Payer: Medicaid Other | Admitting: Psychiatry

## 2016-04-20 DIAGNOSIS — F332 Major depressive disorder, recurrent severe without psychotic features: Secondary | ICD-10-CM
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
## 2016-04-20 NOTE — Patient Instructions (Signed)
Discussed orally 

## 2016-05-18 ENCOUNTER — Ambulatory Visit (INDEPENDENT_AMBULATORY_CARE_PROVIDER_SITE_OTHER): Payer: Medicaid Other | Admitting: Psychiatry

## 2016-05-18 DIAGNOSIS — F332 Major depressive disorder, recurrent severe without psychotic features: Secondary | ICD-10-CM | POA: Diagnosis not present

## 2016-05-18 NOTE — Patient Instructions (Signed)
Discussed orally 
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
## 2016-05-29 ENCOUNTER — Encounter (HOSPITAL_COMMUNITY): Payer: Self-pay | Admitting: Psychiatry

## 2016-05-29 ENCOUNTER — Ambulatory Visit (INDEPENDENT_AMBULATORY_CARE_PROVIDER_SITE_OTHER): Payer: Medicaid Other | Admitting: Psychiatry

## 2016-05-29 VITALS — BP 107/67 | HR 96 | Ht 67.0 in | Wt 264.0 lb

## 2016-05-29 DIAGNOSIS — F4310 Post-traumatic stress disorder, unspecified: Secondary | ICD-10-CM

## 2016-05-29 DIAGNOSIS — F332 Major depressive disorder, recurrent severe without psychotic features: Secondary | ICD-10-CM | POA: Diagnosis not present

## 2016-05-29 MED ORDER — ALPRAZOLAM 1 MG PO TABS: 1.0000 mg | ORAL_TABLET | Freq: Two times a day (BID) | ORAL | Status: DC

## 2016-05-29 MED ORDER — DULOXETINE HCL 60 MG PO CPEP: 60.0000 mg | ORAL_CAPSULE | Freq: Two times a day (BID) | ORAL | Status: DC
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
## 2016-05-31 ENCOUNTER — Ambulatory Visit (INDEPENDENT_AMBULATORY_CARE_PROVIDER_SITE_OTHER): Payer: Medicaid Other | Admitting: Psychiatry

## 2016-05-31 ENCOUNTER — Encounter (HOSPITAL_COMMUNITY): Payer: Self-pay | Admitting: Psychiatry

## 2016-05-31 DIAGNOSIS — F332 Major depressive disorder, recurrent severe without psychotic features: Secondary | ICD-10-CM
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
## 2016-05-31 NOTE — Patient Instructions (Signed)
Discussed orally 

## 2016-06-01 ENCOUNTER — Ambulatory Visit (HOSPITAL_COMMUNITY): Payer: Self-pay | Admitting: Psychiatry

## 2016-06-03 IMAGING — CT CT CERVICAL SPINE W/O CM
3 of 4 series · 11 of 35 positions shown, 13 images · non-contrast
Comparison: Cervical spine radiographs performed 05/19/2014

CLINICAL DATA: Status post fall out of chair. Hit occiput on brick
wall. Neck pain.

EXAM:
CT CERVICAL SPINE WITHOUT CONTRAST
TECHNIQUE: Multidetector CT imaging of the cervical spine was performed without
intravenous contrast. Multiplanar CT image reconstructions were also
generated.

[Series 3: cervical 2.0 st axial · axial · 0.35mm/px · z∈[+62,+206]mm · 3 of 109 slices shown, 4 images]
[im 19/109  soft-tissue]
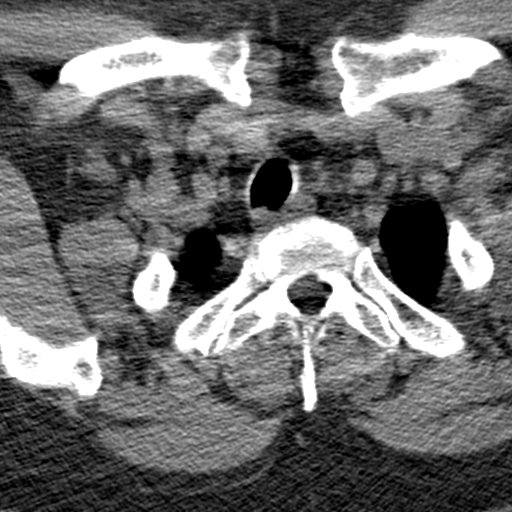
[im 19/109  bone]
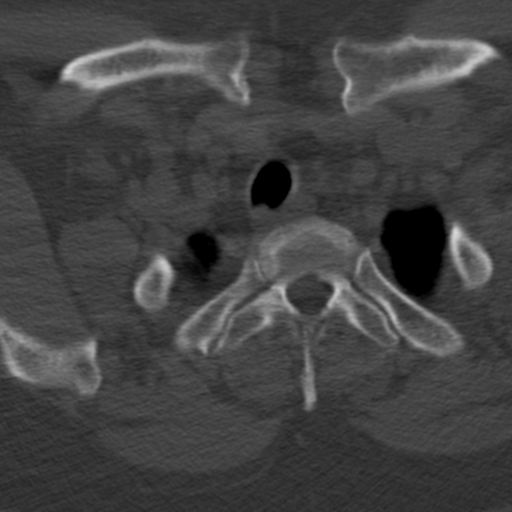
[im 55/109  bone]
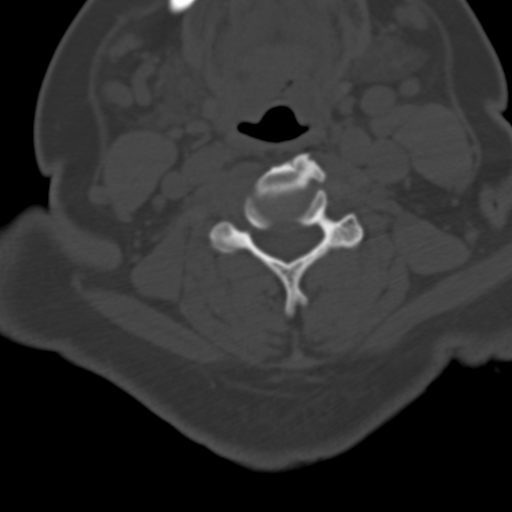
[im 91/109  bone]
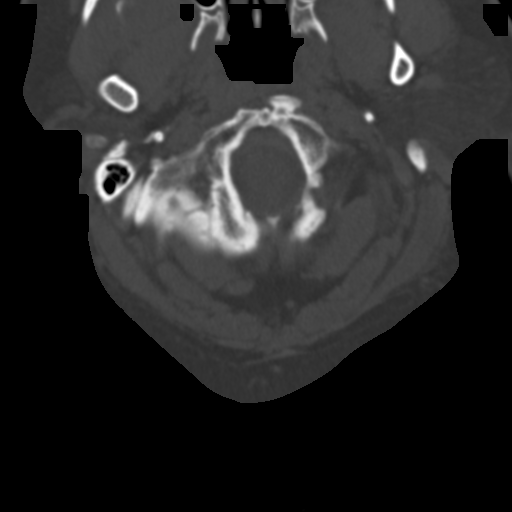

[Series 5: cervical spine sagittal bone · sagittal · 0.27mm/px · 5 of 70 slices shown, 6 images]
[im 24/70  bone]
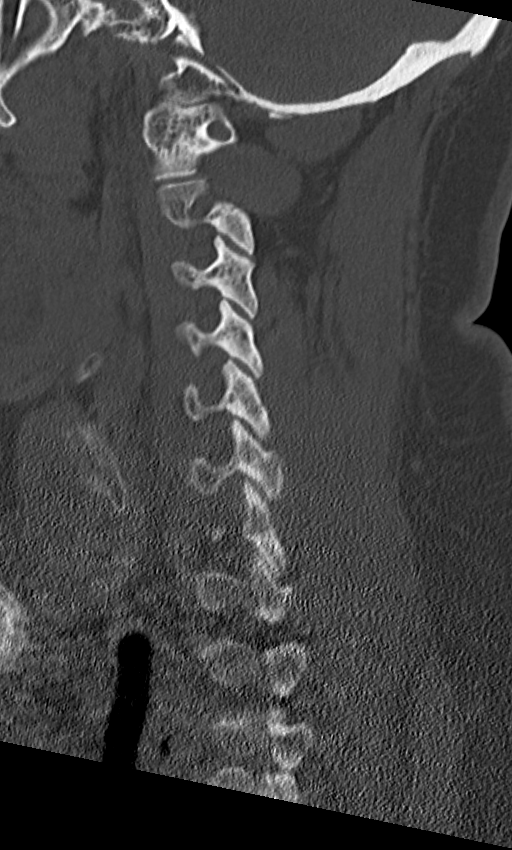
[im 29/70  bone]
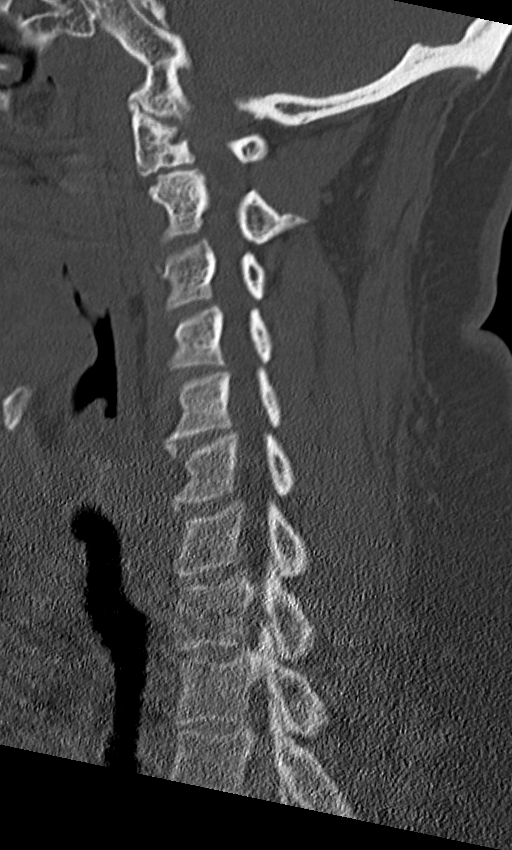
[im 35/70  soft-tissue]
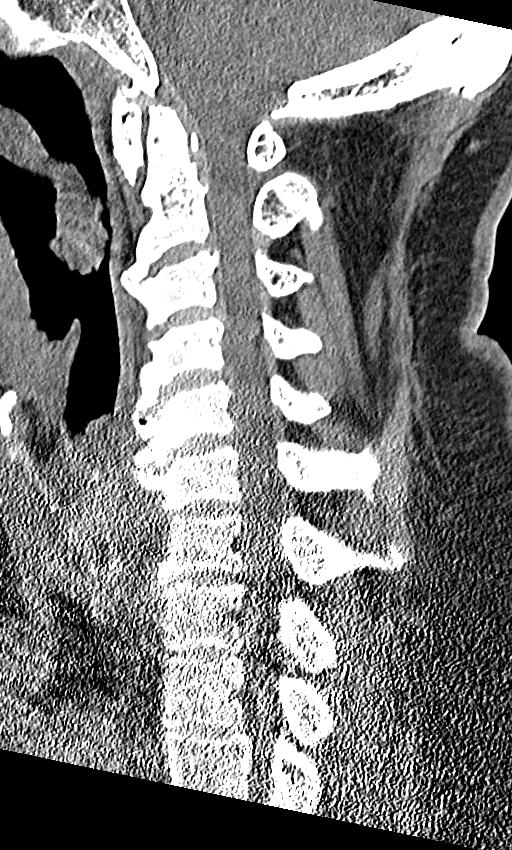
[im 35/70  bone]
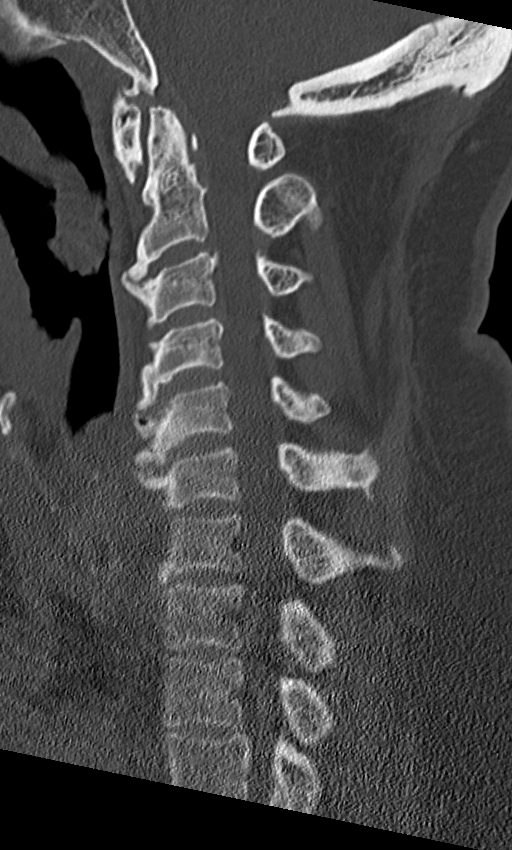
[im 41/70  bone]
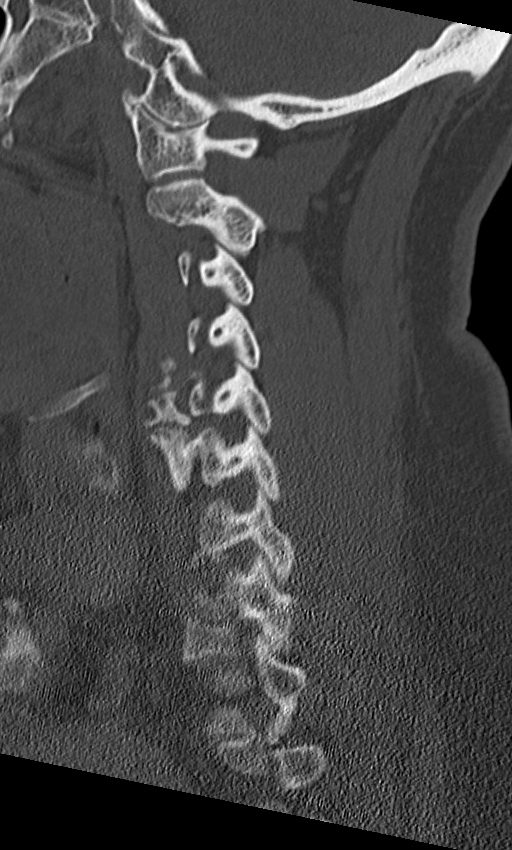
[im 47/70  bone]
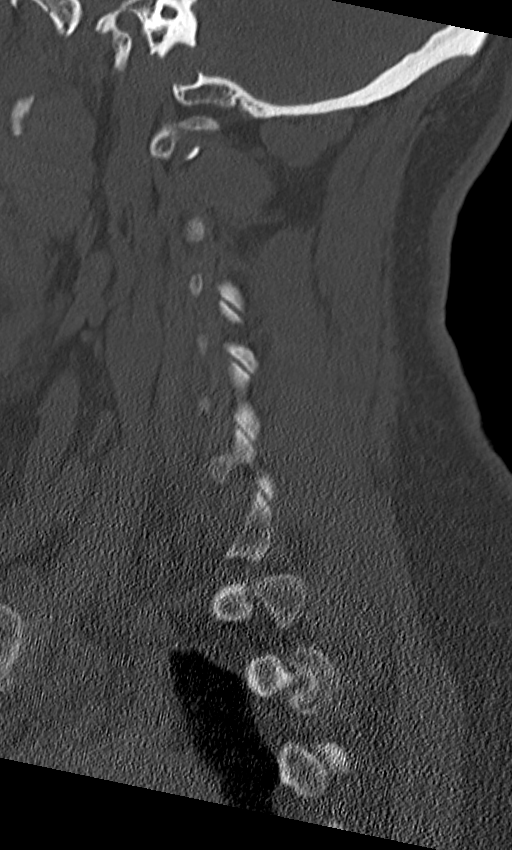

[Series 6: cervical spine coronal bone · coronal · 0.25mm/px · 3 of 55 slices shown]
[im 11/55  bone]
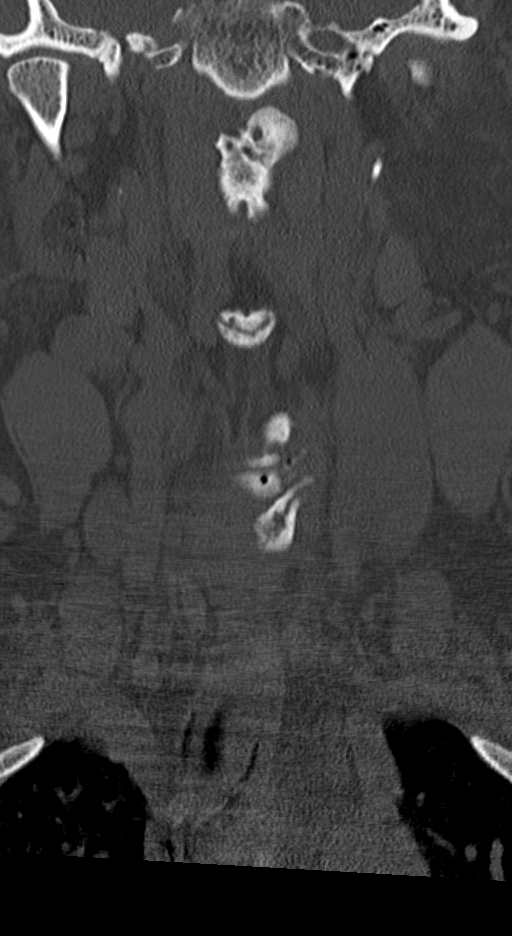
[im 22/55  bone]
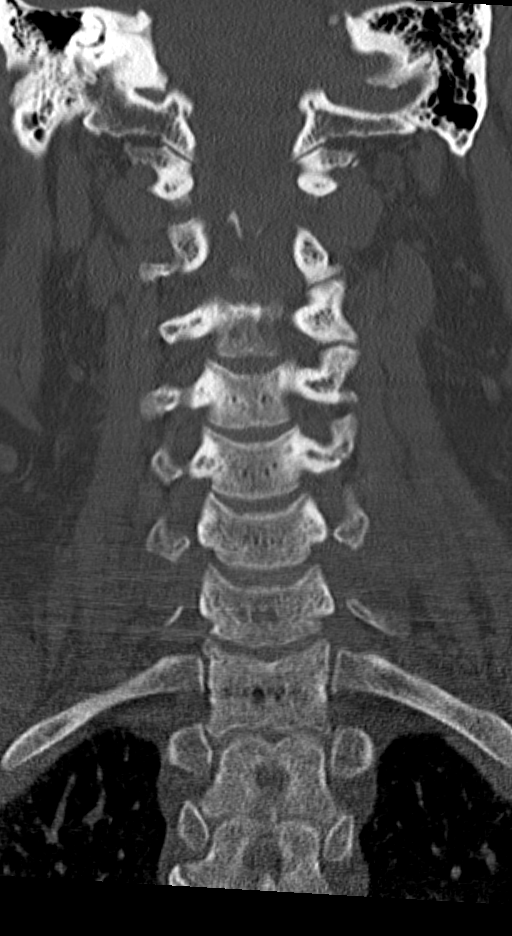
[im 33/55  bone]
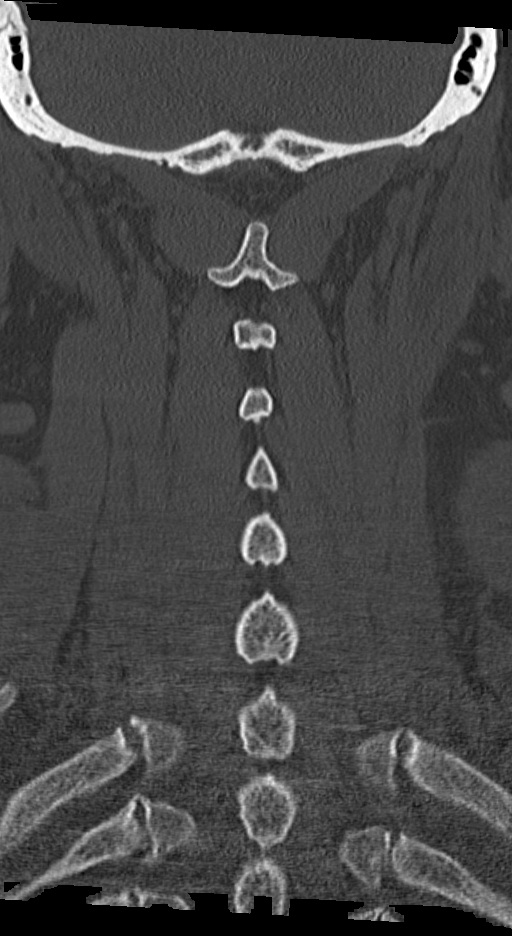

[11 of 35 positions shown; findings below may reference images not displayed]

FINDINGS: There is no evidence of fracture or subluxation. Vertebral bodies
demonstrate normal height and alignment. Intervertebral disc spaces
are preserved. Prevertebral soft tissues are within normal limits.
The visualized neural foramina are grossly unremarkable. Anterior
bridging osteophytes are seen along the cervical spine.

The thyroid gland is unremarkable in appearance. The visualized lung
apices are clear. Prominent cerumen is noted filling the right
external auditory canal, with mild cerumen in the left external
auditory canal.
IMPRESSION: 1. No evidence of fracture or subluxation along the cervical spine.
2. Prominent cerumen noted filling the right external auditory
canal, with mild cerumen in the left external auditory canal.

## 2016-06-07 ENCOUNTER — Ambulatory Visit (HOSPITAL_COMMUNITY): Payer: Self-pay | Admitting: Psychiatry

## 2016-06-07 ENCOUNTER — Emergency Department (HOSPITAL_COMMUNITY)
Admission: EM | Admit: 2016-06-07 | Discharge: 2016-06-07 | Disposition: A | Discharge status: Home / Self Care | Payer: Medicaid Other | Attending: Dermatology | Admitting: Dermatology

## 2016-06-07 ENCOUNTER — Encounter (HOSPITAL_COMMUNITY): Payer: Self-pay | Admitting: Emergency Medicine

## 2016-06-07 DIAGNOSIS — J449 Chronic obstructive pulmonary disease, unspecified: Secondary | ICD-10-CM | POA: Insufficient documentation

## 2016-06-07 DIAGNOSIS — Z5321 Procedure and treatment not carried out due to patient leaving prior to being seen by health care provider: Secondary | ICD-10-CM | POA: Diagnosis not present

## 2016-06-07 DIAGNOSIS — J45909 Unspecified asthma, uncomplicated: Secondary | ICD-10-CM | POA: Diagnosis not present

## 2016-06-07 DIAGNOSIS — I10 Essential (primary) hypertension: Secondary | ICD-10-CM | POA: Diagnosis not present

## 2016-06-07 DIAGNOSIS — F329 Major depressive disorder, single episode, unspecified: Secondary | ICD-10-CM | POA: Diagnosis not present

## 2016-06-07 DIAGNOSIS — M1990 Unspecified osteoarthritis, unspecified site: Secondary | ICD-10-CM | POA: Diagnosis not present

## 2016-06-07 DIAGNOSIS — K0889 Other specified disorders of teeth and supporting structures: Secondary | ICD-10-CM | POA: Insufficient documentation

## 2016-06-07 NOTE — ED Notes (Signed)
Pt states she is tired of waiting and wants to leave

## 2016-06-07 NOTE — ED Notes (Signed)
Pt states she is going to dentist this am and wanted to leave.

## 2016-06-07 NOTE — ED Notes (Signed)
Pt c/o rt sided dental pain for a few days.

## 2016-06-19 ENCOUNTER — Telehealth (HOSPITAL_COMMUNITY): Payer: Self-pay | Admitting: *Deleted

## 2016-06-19 NOTE — Telephone Encounter (Signed)
PATIENT CANCELED APPOINTMENT FOR 06/21/16, DUE TO MEDICAID WAS STOPPED BY HER CASEWORKER.

## 2016-06-21 ENCOUNTER — Telehealth (HOSPITAL_COMMUNITY): Payer: Self-pay | Admitting: *Deleted

## 2016-06-21 ENCOUNTER — Ambulatory Visit (HOSPITAL_COMMUNITY): Payer: Self-pay | Admitting: Psychiatry

## 2016-06-21 NOTE — Telephone Encounter (Signed)
phone call from patient, said she was $100 over and her Medicaid was cut off

## 2016-06-27 ENCOUNTER — Ambulatory Visit: Payer: Self-pay | Admitting: Orthopedic Surgery

## 2016-07-01 ENCOUNTER — Emergency Department (HOSPITAL_COMMUNITY)
Admission: EM | Admit: 2016-07-01 | Discharge: 2016-07-01 | Disposition: A | Discharge status: Home / Self Care | Payer: Medicaid Other | Attending: Emergency Medicine | Admitting: Emergency Medicine

## 2016-07-01 ENCOUNTER — Encounter (HOSPITAL_COMMUNITY): Payer: Self-pay | Admitting: Emergency Medicine

## 2016-07-01 DIAGNOSIS — K029 Dental caries, unspecified: Secondary | ICD-10-CM | POA: Diagnosis not present

## 2016-07-01 DIAGNOSIS — I10 Essential (primary) hypertension: Secondary | ICD-10-CM | POA: Diagnosis not present

## 2016-07-01 DIAGNOSIS — J45909 Unspecified asthma, uncomplicated: Secondary | ICD-10-CM | POA: Diagnosis not present

## 2016-07-01 DIAGNOSIS — K0889 Other specified disorders of teeth and supporting structures: Secondary | ICD-10-CM

## 2016-07-01 DIAGNOSIS — Z79899 Other long term (current) drug therapy: Secondary | ICD-10-CM | POA: Diagnosis not present

## 2016-07-01 DIAGNOSIS — J449 Chronic obstructive pulmonary disease, unspecified: Secondary | ICD-10-CM | POA: Insufficient documentation

## 2016-07-01 MED ORDER — CLINDAMYCIN HCL 300 MG PO CAPS: 300.0000 mg | ORAL_CAPSULE | Freq: Four times a day (QID) | ORAL | 0 refills | Status: DC

## 2016-07-01 MED ORDER — HYDROCODONE-ACETAMINOPHEN 5-325 MG PO TABS: ORAL_TABLET | ORAL | 0 refills | Status: DC

## 2016-07-01 MED ORDER — OXYCODONE-ACETAMINOPHEN 5-325 MG PO TABS
1.0000 | ORAL_TABLET | Freq: Once | ORAL | Status: AC
  Administered 2016-07-01: 1 via ORAL
  Filled 2016-07-01: qty 1

## 2016-07-01 MED ORDER — CLINDAMYCIN HCL 150 MG PO CAPS
300.0000 mg | ORAL_CAPSULE | Freq: Once | ORAL | Status: AC
  Administered 2016-07-01: 300 mg via ORAL
  Filled 2016-07-01: qty 2

## 2016-07-01 NOTE — ED Provider Notes (Signed)
[facility]PT Provider Note   CSN: [id number] Arrival date & time: 07/01/16  1114  First Provider Contact:  First MD Initiated Contact with Patient 07/01/16 1:18 PM By signing my name below, I, Annette Bryant, attest that this documentation has been prepared under the direction and in the presence of non-physician practitioner, Pauline Aus, PA-C Electronically Signed: Levon Bryant, Scribe. 07/01/2016. 1:22 PM.   History   Chief Complaint Chief Complaint  Patient presents with  . Dental Pain    HPI Annette Bryant is a 55 y.o. female with PMHx of COPD and asthma who presents to the Emergency Department complaining of severe upper right sided dental pain. Pt states the pain is "worse than having a baby". She states she was eating hard candy and broke her tooth two weeks ago. She was seen at her dentist and was told that she needed to have oral surgery and was given penicillin which she has taken with no relief. Pt denies trouble swallowing. She has taken ibuprofen with no relief. She also states she tried to pull her own teeth with pliers last evening.    The history is provided by the patient. No language interpreter was used.    Past Medical History:  Diagnosis Date  . Arthritis   . Asthma   . Carpal tunnel syndrome   . COPD (chronic obstructive pulmonary disease) (HCC)   . Depression   . Dyspnea    a. Adm 12/2013 for dyspnea/palps: normal stress-pics-only Lexiscan nuc, echo unremarkable with normal EF, d-dimer normal, ruled out for MI.  Marland Kitchen Elevated lipids   . Hyperglycemia   . Hypertension   . Morbid obesity (HCC)   . Panic attacks   . Sleep apnea    by clinical history    Patient Active Problem List   Diagnosis Date Noted  . Right hand pain 01/19/2016  . Left hand pain 01/19/2016  . Bilateral hand pain 01/19/2016  . Morbid obesity (HCC)   . Elevated lipids   . Hyperglycemia   . Hypertension   . Depression   . Sleep apnea   . Dyspnea 12/11/2013  . Heart  palpitations 12/11/2013  . Dizziness 12/11/2013    Past Surgical History:  Procedure Laterality Date  . CESAREAN SECTION    . TUBAL LIGATION      OB History    No data available       Home Medications    Prior to Admission medications   Medication Sig Start Date End Date Taking? Authorizing Provider  albuterol (PROVENTIL) (2.5 MG/3ML) 0.083% nebulizer solution Take by nebulization every 6 (six) hours as needed for wheezing or shortness of breath.    Historical Provider, MD  ALPRAZolam Prudy Feeler) 1 MG tablet Take 1 tablet (1 mg total) by mouth 2 (two) times daily. 05/29/16   Myrlene Broker, MD  atorvastatin (LIPITOR) 20 MG tablet Take 20 mg by mouth daily. 11/05/14   Historical Provider, MD  Budesonide-Formoterol Fumarate (SYMBICORT IN) Inhale 2 puffs into the lungs.    Historical Provider, MD  cyclobenzaprine (FLEXERIL) 5 MG tablet Take 5 mg by mouth 2 (two) times daily as needed. 05/10/16   Historical Provider, MD  diclofenac (CATAFLAM) 50 MG tablet Take 1 tablet (50 mg total) by mouth 2 (two) times daily. 08/13/15   Vickki Hearing, MD  DULoxetine (CYMBALTA) 60 MG capsule Take 1 capsule (60 mg total) by mouth 2 (two) times daily. 05/29/16   Myrlene Broker, MD  furosemide (LASIX) 40 MG tablet  Take 40 mg by mouth daily.    Historical Provider, MD  HYDROcodone-acetaminophen (NORCO) 7.5-325 MG per tablet Take 1 tablet by mouth every 6 (six) hours as needed for moderate pain.    Historical Provider, MD  naproxen (NAPROSYN) 500 MG tablet Take 1 tablet (500 mg total) by mouth 2 (two) times daily. Patient taking differently: Take 500 mg by mouth 2 (two) times daily as needed.  08/09/15   Shon Baton, MD  pregabalin (LYRICA) 25 MG capsule Take 25 mg by mouth 2 (two) times daily.    Historical Provider, MD  PROAIR HFA 108 (90 BASE) MCG/ACT inhaler  06/27/15   Historical Provider, MD    Family History Family History  Problem Relation Age of Onset  . Coronary artery disease Sister 40     stents  . Depression Sister   . Coronary artery disease Sister   . Heart attack Sister 54    CHF, MI  . Schizophrenia Brother   . Alcohol abuse Brother   . Depression Mother   . Alcohol abuse Father     Social History Social History  Substance Use Topics  . Smoking status: Never Smoker  . Smokeless tobacco: Never Used  . Alcohol use No     Allergies   Review of patient's allergies indicates no known allergies.   Review of Systems Review of Systems  Constitutional: Negative for appetite change and fever.  HENT: Positive for dental problem. Negative for congestion, facial swelling, sore throat and trouble swallowing.   Eyes: Negative for pain and visual disturbance.  Musculoskeletal: Negative for neck pain and neck stiffness.  Neurological: Negative for dizziness, facial asymmetry and headaches.  Hematological: Negative for adenopathy.  All other systems reviewed and are negative.   Physical Exam Updated Vital Signs BP 144/91   Pulse 93   Temp 98.5 F (36.9 C)   Resp 18   Ht 5\' 7"  (1.702 m)   Wt 264 lb (119.7 kg)   LMP 01/23/2016   SpO2 97%   BMI 41.35 kg/m   Physical Exam  Constitutional: She is oriented to person, place, and time. Vital signs are normal. She appears well-developed and well-nourished.  Non-toxic appearance.  Afebrile, nontoxic, very anxious and tearful  HENT:  Head: Normocephalic and atraumatic.  Mouth/Throat: Uvula is midline, oropharynx is clear and moist and mucous membranes are normal. No trismus in the jaw. Dental caries present. No dental abscesses or uvula swelling.  Tenderness and partial dental avulsions with caries to the right upper first and second premolars. No abscess.   Eyes: Conjunctivae are normal. Right eye exhibits no discharge. Left eye exhibits no discharge.  Neck: Normal range of motion. Neck supple.  No lymphadenopathy  Cardiovascular: Normal rate, normal heart sounds and intact distal pulses.   Pulmonary/Chest: Effort  normal and breath sounds normal. No respiratory distress. She has no decreased breath sounds. She has no rhonchi.  Abdominal: Normal appearance. There is no rigidity, no CVA tenderness, no tenderness at McBurney's point and negative Murphy's sign.  Musculoskeletal: Normal range of motion.  Neurological: She is alert and oriented to person, place, and time. She has normal strength. No sensory deficit.  Skin: Skin is warm, dry and intact. No rash noted.  Nursing note and vitals reviewed.    ED Treatments / Results  DIAGNOSTIC STUDIES:  Oxygen Saturation is 97% on RA, normal by my interpretation.    COORDINATION OF CARE:  12:57 PM Discussed treatment plan with pt at bedside and pt  agreed to plan.  Labs (all labs ordered are listed, but only abnormal results are displayed) Labs Reviewed - No data to display  EKG  EKG Interpretation None       Radiology No results found.  Procedures Procedures (including critical care time)  Medications Ordered in ED Medications - No data to display   Initial Impression / Assessment and Plan / ED Course  I have reviewed the triage vital signs and the nursing notes.  Pertinent labs & imaging results that were available during my care of the patient were reviewed by me and considered in my medical decision making (see chart for details).  Clinical Course    Pt is non-toxic.  Tearful and very anxious. No dental abscess.  No concerning sxs for Ludwig's angina.  Referral for dentistry  I personally performed the services described in this documentation, which was scribed in my presence. The recorded information has been reviewed and is accurate.  Final Clinical Impressions(s) / ED Diagnoses   Final diagnoses:  Pain, dental    New Prescriptions Discharge Medication List as of 07/01/2016  1:33 PM    START taking these medications   Details  clindamycin (CLEOCIN) 300 MG capsule Take 1 capsule (300 mg total) by mouth 4 (four) times  daily. For 7 days, Starting Sun 07/01/2016, Print    HYDROcodone-acetaminophen (NORCO/VICODIN) 5-325 MG tablet Take one-two tabs po q 4-6 hrs prn pain, Print         Pauline Aus, PA-C 07/02/16 2102    Mancel Bale, MD 07/03/16 [phone]

## 2016-07-01 NOTE — Discharge Instructions (Signed)
Take the antibiotic as directed until its finished.  Call Dr. Katrinka Blazing and reschedule your follow-up appt.

## 2016-07-01 NOTE — ED Triage Notes (Signed)
Patient c/o right upper dental pain that started this morning at 2 am. Patient reports taking ibuprofen at 10:30. Denies any relief in pain. Patient unsure of any fevers or swelling.

## 2016-07-02 ENCOUNTER — Encounter (HOSPITAL_COMMUNITY): Payer: Self-pay | Admitting: *Deleted

## 2016-07-02 ENCOUNTER — Emergency Department (HOSPITAL_COMMUNITY): Payer: Medicaid Other

## 2016-07-02 ENCOUNTER — Emergency Department (HOSPITAL_COMMUNITY)
Admission: EM | Admit: 2016-07-02 | Discharge: 2016-07-02 | Disposition: A | Discharge status: Home / Self Care | Payer: Medicaid Other | Attending: Emergency Medicine | Admitting: Emergency Medicine

## 2016-07-02 DIAGNOSIS — K047 Periapical abscess without sinus: Secondary | ICD-10-CM | POA: Insufficient documentation

## 2016-07-02 DIAGNOSIS — J45909 Unspecified asthma, uncomplicated: Secondary | ICD-10-CM | POA: Insufficient documentation

## 2016-07-02 DIAGNOSIS — J449 Chronic obstructive pulmonary disease, unspecified: Secondary | ICD-10-CM | POA: Insufficient documentation

## 2016-07-02 DIAGNOSIS — R220 Localized swelling, mass and lump, head: Secondary | ICD-10-CM

## 2016-07-02 DIAGNOSIS — Z79899 Other long term (current) drug therapy: Secondary | ICD-10-CM | POA: Diagnosis not present

## 2016-07-02 DIAGNOSIS — Z79891 Long term (current) use of opiate analgesic: Secondary | ICD-10-CM | POA: Insufficient documentation

## 2016-07-02 DIAGNOSIS — I10 Essential (primary) hypertension: Secondary | ICD-10-CM | POA: Diagnosis not present

## 2016-07-02 DIAGNOSIS — L03211 Cellulitis of face: Secondary | ICD-10-CM | POA: Diagnosis not present

## 2016-07-02 DIAGNOSIS — Z791 Long term (current) use of non-steroidal anti-inflammatories (NSAID): Secondary | ICD-10-CM | POA: Insufficient documentation

## 2016-07-02 LAB — CBC WITH DIFFERENTIAL/PLATELET
BASOS ABS: 0 10*3/uL (ref 0.0–0.1)
Basophils Relative: 0 %
EOS ABS: 0.1 10*3/uL (ref 0.0–0.7)
Eosinophils Relative: 1 %
HCT: 38.3 % (ref 36.0–46.0)
HEMOGLOBIN: 12.3 g/dL (ref 12.0–15.0)
LYMPHS ABS: 1.6 10*3/uL (ref 0.7–4.0)
Lymphocytes Relative: 17 %
MCH: 26.8 pg (ref 26.0–34.0)
MCHC: 32.1 g/dL (ref 30.0–36.0)
MCV: 83.4 fL (ref 78.0–100.0)
Monocytes Absolute: 0.8 10*3/uL (ref 0.1–1.0)
Monocytes Relative: 8 %
NEUTROS PCT: 74 %
Neutro Abs: 7.4 10*3/uL (ref 1.7–7.7)
Platelets: 250 10*3/uL (ref 150–400)
RBC: 4.59 MIL/uL (ref 3.87–5.11)
RDW: 15.4 % (ref 11.5–15.5)
WBC: 10 10*3/uL (ref 4.0–10.5)

## 2016-07-02 LAB — BASIC METABOLIC PANEL
ANION GAP: 3 — AB (ref 5–15)
BUN: 10 mg/dL (ref 6–20)
CHLORIDE: 106 mmol/L (ref 101–111)
CO2: 28 mmol/L (ref 22–32)
Calcium: 8.4 mg/dL — ABNORMAL LOW (ref 8.9–10.3)
Creatinine, Ser: 0.83 mg/dL (ref 0.44–1.00)
GFR calc non Af Amer: >60 mL/min (ref >60)
Glucose, Bld: 128 mg/dL — ABNORMAL HIGH (ref 65–99)
POTASSIUM: 3.9 mmol/L (ref 3.5–5.1)
SODIUM: 137 mmol/L (ref 135–145)

## 2016-07-02 MED ORDER — DIPHENHYDRAMINE HCL 50 MG/ML IJ SOLN
50.0000 mg | Freq: Once | INTRAMUSCULAR | Status: AC
  Administered 2016-07-02: 50 mg via INTRAVENOUS
  Filled 2016-07-02: qty 1

## 2016-07-02 MED ORDER — PENICILLIN V POTASSIUM 500 MG PO TABS: 500.0000 mg | ORAL_TABLET | Freq: Four times a day (QID) | ORAL | 0 refills | Status: AC

## 2016-07-02 MED ORDER — ONDANSETRON HCL 4 MG/2ML IJ SOLN
4.0000 mg | Freq: Once | INTRAMUSCULAR | Status: AC
  Administered 2016-07-02: 4 mg via INTRAVENOUS
  Filled 2016-07-02: qty 2

## 2016-07-02 MED ORDER — PENICILLIN G BENZATHINE 1200000 UNIT/2ML IM SUSP
1.2000 10*6.[IU] | Freq: Once | INTRAMUSCULAR | Status: AC
  Administered 2016-07-02: 1.2 10*6.[IU] via INTRAMUSCULAR
  Filled 2016-07-02: qty 2

## 2016-07-02 MED ORDER — HYDROMORPHONE HCL 1 MG/ML IJ SOLN
1.0000 mg | Freq: Once | INTRAMUSCULAR | Status: AC
  Administered 2016-07-02: 1 mg via INTRAVENOUS
  Filled 2016-07-02: qty 1

## 2016-07-02 MED ORDER — KETOROLAC TROMETHAMINE 30 MG/ML IJ SOLN
30.0000 mg | Freq: Once | INTRAMUSCULAR | Status: AC
  Administered 2016-07-02: 30 mg via INTRAVENOUS
  Filled 2016-07-02: qty 1

## 2016-07-02 MED ORDER — SODIUM CHLORIDE 0.9 % IV BOLUS (SEPSIS)
1000.0000 mL | Freq: Once | INTRAVENOUS | Status: AC
  Administered 2016-07-02: 1000 mL via INTRAVENOUS

## 2016-07-02 MED ORDER — IOPAMIDOL (ISOVUE-300) INJECTION 61%
75.0000 mL | Freq: Once | INTRAVENOUS | Status: AC | PRN
  Administered 2016-07-02: 75 mL via INTRAVENOUS

## 2016-07-02 MED ORDER — DEXAMETHASONE SODIUM PHOSPHATE 10 MG/ML IJ SOLN
10.0000 mg | Freq: Once | INTRAMUSCULAR | Status: AC
  Administered 2016-07-02: 10 mg via INTRAVENOUS
  Filled 2016-07-02: qty 1

## 2016-07-02 NOTE — ED Triage Notes (Signed)
Pt states that she was seen in er yesterday for dental pain located on right side, was started on clindamycin, woke up today with swelling and increased pain to right side of face. Pt alert, denies any other swelling, denies any itching, no rash or hives noted,

## 2016-07-02 NOTE — ED Provider Notes (Signed)
TIME SEEN: 4:30 AM  CHIEF COMPLAINT: Right-sided facial swelling, dental pain  HPI: Pt is a 55 y.o. female with history of COPD, hypertension, panic attacks, obesity who presents to the emergency department with complaints of right-sided facial swelling, dental pain. Was seen in the emergency department yesterday for dental pain to an upper right fractured bicuspid. Was seen by her dentist 2 weeks ago was told that she needed surgery. Start having pain several days ago. States she has been on penicillin before with good relief but was placed on clindamycin yesterday. Patient thinks it is the clindamycin causing her facial swelling and pain. Facial swelling is unilateral. No angioedema. No Ludwig's angina. No wheezing, difficulty breathing. She is spitting but is able to swallow. Has Vicodin at home for pain. No hives.  ROS: See HPI Constitutional: no fever  Eyes: no drainage  ENT: no runny nose   Cardiovascular:  no chest pain  Resp: no SOB  GI: no vomiting GU: no dysuria Integumentary: no rash  Allergy: no hives  Musculoskeletal: no leg swelling  Neurological: no slurred speech ROS otherwise negative  PAST MEDICAL HISTORY/PAST SURGICAL HISTORY:  Past Medical History:  Diagnosis Date  . Arthritis   . Asthma   . Carpal tunnel syndrome   . COPD (chronic obstructive pulmonary disease) (HCC)   . Depression   . Dyspnea    a. Adm 12/2013 for dyspnea/palps: normal stress-pics-only Lexiscan nuc, echo unremarkable with normal EF, d-dimer normal, ruled out for MI.  Marland Kitchen Elevated lipids   . Hyperglycemia   . Hypertension   . Morbid obesity (HCC)   . Panic attacks   . Sleep apnea    by clinical history    MEDICATIONS:  Prior to Admission medications   Medication Sig Start Date End Date Taking? Authorizing Provider  albuterol (PROVENTIL) (2.5 MG/3ML) 0.083% nebulizer solution Take by nebulization every 6 (six) hours as needed for wheezing or shortness of breath.   Yes Historical Provider, MD   ALPRAZolam Prudy Feeler) 1 MG tablet Take 1 tablet (1 mg total) by mouth 2 (two) times daily. 05/29/16  Yes Myrlene Broker, MD  atorvastatin (LIPITOR) 20 MG tablet Take 20 mg by mouth daily. 11/05/14  Yes Historical Provider, MD  Budesonide-Formoterol Fumarate (SYMBICORT IN) Inhale 2 puffs into the lungs daily.    Yes Historical Provider, MD  clindamycin (CLEOCIN) 300 MG capsule Take 1 capsule (300 mg total) by mouth 4 (four) times daily. For 7 days 07/01/16  Yes Tammy Triplett, PA-C  cyclobenzaprine (FLEXERIL) 5 MG tablet Take 5 mg by mouth 2 (two) times daily as needed. 05/10/16  Yes Historical Provider, MD  diclofenac (CATAFLAM) 50 MG tablet Take 1 tablet (50 mg total) by mouth 2 (two) times daily. 08/13/15  Yes Vickki Hearing, MD  DULoxetine (CYMBALTA) 60 MG capsule Take 1 capsule (60 mg total) by mouth 2 (two) times daily. 05/29/16  Yes Myrlene Broker, MD  furosemide (LASIX) 40 MG tablet Take 40 mg by mouth daily.   Yes Historical Provider, MD  HYDROcodone-acetaminophen (NORCO/VICODIN) 5-325 MG tablet Take one-two tabs po q 4-6 hrs prn pain 07/01/16  Yes Tammy Triplett, PA-C  naproxen (NAPROSYN) 500 MG tablet Take 1 tablet (500 mg total) by mouth 2 (two) times daily. Patient taking differently: Take 500 mg by mouth 2 (two) times daily as needed.  08/09/15  Yes Shon Baton, MD  pregabalin (LYRICA) 25 MG capsule Take 25 mg by mouth 2 (two) times daily.   Yes Historical Provider,  MD  PROAIR HFA 108 (90 BASE) MCG/ACT inhaler Inhale 1-2 puffs into the lungs every 6 (six) hours as needed for wheezing or shortness of breath.  06/27/15  Yes Historical Provider, MD    ALLERGIES:  No Known Allergies  SOCIAL HISTORY:  Social History  Substance Use Topics  . Smoking status: Never Smoker  . Smokeless tobacco: Never Used  . Alcohol use No    FAMILY HISTORY: Family History  Problem Relation Age of Onset  . Coronary artery disease Sister 40    stents  . Depression Sister   . Coronary artery disease  Sister   . Heart attack Sister 80    CHF, MI  . Schizophrenia Brother   . Alcohol abuse Brother   . Depression Mother   . Alcohol abuse Father     EXAM: BP 151/88   Pulse 100   Temp 98.2 F (36.8 C) (Oral)   Resp 24   Ht 5\' 7"  (1.702 m)   Wt 264 lb (119.7 kg)   LMP 01/23/2016   SpO2 99%   BMI 41.35 kg/m  CONSTITUTIONAL: Alert and oriented and responds appropriately to questions. Well-appearing; well-nourished HEAD: Normocephalic EYES: Conjunctivae clear, PERRL ENT: normal nose; no rhinorrhea; moist mucous membranes; No pharyngeal erythema or petechiae, no tonsillar hypertrophy or exudate, no uvular deviation, no trismus or drooling, normal phonation, no stridor, no dental caries or drainable abscess noted, patient has a right upper fractured bicuspid that is tender to palpation, patient has right-sided facial swelling but no Ludwig's angina, tongue sits flat in the bottom of the mouth, she is able to swallow her secretions but is spitting intermittently, no stridor, no angioedema NECK: Supple, no meningismus, no LAD  CARD: RRR; S1 and S2 appreciated; no murmurs, no clicks, no rubs, no gallops RESP: Normal chest excursion without splinting or tachypnea; breath sounds clear and equal bilaterally; no wheezes, no rhonchi, no rales, no hypoxia or respiratory distress, speaking full sentences ABD/GI: Normal bowel sounds; non-distended; soft, non-tender, no rebound, no guarding, no peritoneal signs BACK:  The back appears normal and is non-tender to palpation, there is no CVA tenderness EXT: Normal ROM in all joints; non-tender to palpation; no edema; normal capillary refill; no cyanosis, no calf tenderness or swelling    SKIN: Normal color for age and race; warm; no rash, no hives NEURO: Moves all extremities equally, sensation to light touch intact diffusely, cranial nerves II through XII intact PSYCH: The patient's mood and manner are appropriate. Grooming and personal hygiene are  appropriate.  MEDICAL DECISION MAKING: Patient here with right-sided facial swelling, dental pain. Suspect dental abscess. No drainable abscess seen on exam. No Ludwig's angina. Airway appears intact and she has normal phonation. She is able to swallow her secretions but is spitting intermittently. She is convinced that this is the clindamycin causing her symptoms but she only has unilateral facial swelling. I feel this is more likely infection. No sign of facial cellulitis on exam. She has no hives, wheezing or hypotension. Will treat her symptomatically with Benadryl, Zofran, Decadron, IM penicillin. Will obtain labs and a CT of her face. She has a Education officer, community for follow-up. Was planning to call them in the morning.  ED PROGRESS: CT:  Findings consistent with acute right facial cellulitis, odontogenic in origin. 10 x 4 x 2 mm hypodensity adjacent to the right maxillary first bicuspid (tooth # 5) consistent with a small odontogenic abscess. Thin 13 x 2 x 11 hypodense collection slightly more posteriorly along the  right maxilla also consistent with a small abscess. These collections are likely contiguous with one another.  Patient reports feeling better. Is able to swallow her secretions without difficulty and drink. Again no sign of Ludwig's angina. Discussed these findings with patient and her daughter. Recommended close outpatient follow up with her dentist. She still refuses to take clindamycin although I have discussed with her that this is not an allergic reaction. We'll discharge her with a perception for penicillin and she states this has worked well for her in the past and this is the only medication she feels comfortable taking. She is already on NSAIDs at home and was given Vicodin yesterday. I do not feel she needs further pain medication at this time. She has a Education officer, community for follow-up. Patient's daughter initially said she would be able to drive patient home but now she states that "I don't have my  glasses" and mother reports that her daughter only has a driver's permit. Discussed with patient that she should not drive given she has received Benadryl, Dilaudid in the emergency department and they will need a sober driver or she will have to wait until 10 AM, 4 hours after receiving Dilaudid to be discharged. They're comfortable with this plan.    At this time, I do not feel there is any life-threatening condition present. I have reviewed and discussed all results (EKG, imaging, lab, urine as appropriate), exam findings with patient. I have reviewed nursing notes and appropriate previous records.  I feel the patient is safe to be discharged home without further emergent workup. Discussed usual and customary return precautions. Patient and family (if present) verbalize understanding and are comfortable with this plan.  Patient will follow-up with their primary care provider. If they do not have a primary care provider, information for follow-up has been provided to them. All questions have been answered.    Annette Maw Faren Florence, DO 07/02/16 [phone]

## 2016-07-02 NOTE — ED Notes (Signed)
Pt sleeping   Family at bedside. 

## 2016-08-23 ENCOUNTER — Ambulatory Visit (HOSPITAL_COMMUNITY): Payer: Self-pay | Admitting: Psychiatry

## 2016-08-23 ENCOUNTER — Telehealth (HOSPITAL_COMMUNITY): Payer: Self-pay | Admitting: *Deleted

## 2016-08-23 NOTE — Telephone Encounter (Signed)
left voice message regarding provider out of office 08/23/16

## 2016-09-10 ENCOUNTER — Telehealth (HOSPITAL_COMMUNITY): Payer: Self-pay | Admitting: *Deleted

## 2016-09-14 ENCOUNTER — Ambulatory Visit (INDEPENDENT_AMBULATORY_CARE_PROVIDER_SITE_OTHER): Payer: Self-pay | Admitting: Psychiatry

## 2016-09-14 ENCOUNTER — Encounter (HOSPITAL_COMMUNITY): Payer: Self-pay | Admitting: Psychiatry

## 2016-09-14 VITALS — BP 137/86 | HR 82 | Ht 67.0 in | Wt 268.0 lb

## 2016-09-14 DIAGNOSIS — F4310 Post-traumatic stress disorder, unspecified: Secondary | ICD-10-CM

## 2016-09-14 DIAGNOSIS — F332 Major depressive disorder, recurrent severe without psychotic features: Secondary | ICD-10-CM

## 2016-09-14 MED ORDER — ALPRAZOLAM 1 MG PO TABS: 1.0000 mg | ORAL_TABLET | Freq: Two times a day (BID) | ORAL | 2 refills | Status: DC
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
## 2016-12-13 ENCOUNTER — Ambulatory Visit (HOSPITAL_COMMUNITY): Payer: Self-pay | Admitting: Psychiatry

## 2017-02-19 ENCOUNTER — Ambulatory Visit (HOSPITAL_COMMUNITY): Payer: Self-pay | Admitting: Psychiatry

## 2017-02-20 ENCOUNTER — Encounter (HOSPITAL_COMMUNITY): Payer: Self-pay | Admitting: Psychiatry

## 2017-02-20 ENCOUNTER — Ambulatory Visit (INDEPENDENT_AMBULATORY_CARE_PROVIDER_SITE_OTHER): Payer: Medicaid Other | Admitting: Psychiatry

## 2017-02-20 VITALS — Ht 67.0 in

## 2017-02-20 DIAGNOSIS — Z818 Family history of other mental and behavioral disorders: Secondary | ICD-10-CM

## 2017-02-20 DIAGNOSIS — F332 Major depressive disorder, recurrent severe without psychotic features: Secondary | ICD-10-CM | POA: Diagnosis not present

## 2017-02-20 DIAGNOSIS — Z811 Family history of alcohol abuse and dependence: Secondary | ICD-10-CM

## 2017-02-20 DIAGNOSIS — F4310 Post-traumatic stress disorder, unspecified: Secondary | ICD-10-CM | POA: Diagnosis not present

## 2017-02-20 MED ORDER — ALPRAZOLAM 1 MG PO TABS: 1.0000 mg | ORAL_TABLET | Freq: Two times a day (BID) | ORAL | 2 refills | Status: DC

## 2017-02-20 MED ORDER — DULOXETINE HCL 60 MG PO CPEP: 60.0000 mg | ORAL_CAPSULE | Freq: Two times a day (BID) | ORAL | 2 refills | Status: DC
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
## 2017-03-01 ENCOUNTER — Ambulatory Visit (INDEPENDENT_AMBULATORY_CARE_PROVIDER_SITE_OTHER): Payer: Medicaid Other | Admitting: Orthopedic Surgery

## 2017-03-01 ENCOUNTER — Encounter: Payer: Self-pay | Admitting: Orthopedic Surgery

## 2017-03-01 DIAGNOSIS — G5601 Carpal tunnel syndrome, right upper limb: Secondary | ICD-10-CM

## 2017-03-01 DIAGNOSIS — G379 Demyelinating disease of central nervous system, unspecified: Secondary | ICD-10-CM

## 2017-03-01 DIAGNOSIS — M792 Neuralgia and neuritis, unspecified: Secondary | ICD-10-CM | POA: Diagnosis not present

## 2017-03-01 NOTE — Progress Notes (Signed)
FOLLOW UP VISIT   Patient ID: Annette Bryant, female   DOB: 12-31-1960, 56 y.o.   MRN: [id number]  Chief Complaint  Patient presents with  . Follow-up    RIGHT CARPAL TUNNEL    HPI Annette Bryant is a 56 y.o. female.   HPI  56 year old female recheck right carpal tunnel syndrome status post injection which did help. 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  Review of Systems Review of Systems   She denies numbness or tingling in the left upper extremity  Physical Exam She is awake alert and oriented 3 mood and affect are normal She demonstrates decreased flexion of the MP joints and IP joints of the left hand passive range of motion is normal active range of motion is not  She has no tenderness over the carpal tunnel she has normal sensation in the left hand normal pulse and perfusion normal color  She has tenderness over the right carpal tunnel which is mild but negative compression and Tinel's   MEDICAL DECISION MAKING  DATA   Nerve conduction study shows right median nerve compression and 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  . Carpal tunnel syndrome, right Yes  . Polyneuropathic pain   . Demyelinating disease (HCC)     As this is a medical condition on the left elbow for for her back to neurology for further evaluation treatment and follow-up  PLAN(RISK)    We not recommending surgery on the right as her symptoms are intermittent and under control follow-up as needed

## 2017-03-27 ENCOUNTER — Other Ambulatory Visit (HOSPITAL_COMMUNITY): Payer: Self-pay | Admitting: Neurology

## 2017-03-27 DIAGNOSIS — R531 Weakness: Secondary | ICD-10-CM

## 2017-04-03 ENCOUNTER — Ambulatory Visit (HOSPITAL_COMMUNITY): Payer: Medicaid Other

## 2017-04-10 ENCOUNTER — Other Ambulatory Visit (HOSPITAL_COMMUNITY): Payer: Self-pay

## 2017-04-15 ENCOUNTER — Ambulatory Visit (HOSPITAL_COMMUNITY): Payer: Medicaid Other

## 2017-04-15 ENCOUNTER — Other Ambulatory Visit (HOSPITAL_COMMUNITY): Payer: Self-pay

## 2017-04-17 ENCOUNTER — Ambulatory Visit (HOSPITAL_COMMUNITY)
Admission: RE | Admit: 2017-04-17 | Discharge: 2017-04-17 | Disposition: A | Discharge status: Home / Self Care | Payer: Medicaid Other | Source: Ambulatory Visit | Attending: Neurology | Admitting: Neurology

## 2017-04-17 DIAGNOSIS — R531 Weakness: Secondary | ICD-10-CM | POA: Diagnosis present

## 2017-04-17 DIAGNOSIS — M5031 Other cervical disc degeneration,  high cervical region: Secondary | ICD-10-CM | POA: Diagnosis not present

## 2017-04-17 DIAGNOSIS — M4802 Spinal stenosis, cervical region: Secondary | ICD-10-CM | POA: Diagnosis not present

## 2017-04-17 DIAGNOSIS — R9082 White matter disease, unspecified: Secondary | ICD-10-CM | POA: Diagnosis not present

## 2017-04-24 ENCOUNTER — Encounter (HOSPITAL_COMMUNITY): Payer: Self-pay | Admitting: Emergency Medicine

## 2017-04-24 ENCOUNTER — Emergency Department (HOSPITAL_COMMUNITY)
Admission: EM | Admit: 2017-04-24 | Discharge: 2017-04-24 | Disposition: A | Discharge status: Home / Self Care | Payer: Medicaid Other | Attending: Emergency Medicine | Admitting: Emergency Medicine

## 2017-04-24 ENCOUNTER — Emergency Department (HOSPITAL_COMMUNITY): Payer: Medicaid Other

## 2017-04-24 DIAGNOSIS — J449 Chronic obstructive pulmonary disease, unspecified: Secondary | ICD-10-CM | POA: Diagnosis not present

## 2017-04-24 DIAGNOSIS — I10 Essential (primary) hypertension: Secondary | ICD-10-CM | POA: Insufficient documentation

## 2017-04-24 DIAGNOSIS — J45909 Unspecified asthma, uncomplicated: Secondary | ICD-10-CM | POA: Diagnosis not present

## 2017-04-24 DIAGNOSIS — Z79899 Other long term (current) drug therapy: Secondary | ICD-10-CM | POA: Insufficient documentation

## 2017-04-24 DIAGNOSIS — R140 Abdominal distension (gaseous): Secondary | ICD-10-CM

## 2017-04-24 DIAGNOSIS — K439 Ventral hernia without obstruction or gangrene: Secondary | ICD-10-CM | POA: Diagnosis not present

## 2017-04-24 LAB — CBC WITH DIFFERENTIAL/PLATELET
Basophils Absolute: 0 10*3/uL (ref 0.0–0.1)
Basophils Relative: 0 %
EOS PCT: 1 %
Eosinophils Absolute: 0.1 10*3/uL (ref 0.0–0.7)
HEMATOCRIT: 44 % (ref 36.0–46.0)
Hemoglobin: 13.2 g/dL (ref 12.0–15.0)
LYMPHS ABS: 2.5 10*3/uL (ref 0.7–4.0)
LYMPHS PCT: 33 %
MCH: 26.4 pg (ref 26.0–34.0)
MCHC: 30 g/dL (ref 30.0–36.0)
MCV: 88 fL (ref 78.0–100.0)
Monocytes Absolute: 0.6 10*3/uL (ref 0.1–1.0)
Monocytes Relative: 8 %
NEUTROS ABS: 4.4 10*3/uL (ref 1.7–7.7)
Neutrophils Relative %: 58 %
PLATELETS: 284 10*3/uL (ref 150–400)
RBC: 5 MIL/uL (ref 3.87–5.11)
RDW: 15.7 % — ABNORMAL HIGH (ref 11.5–15.5)
WBC: 7.5 10*3/uL (ref 4.0–10.5)

## 2017-04-24 LAB — URINALYSIS, ROUTINE W REFLEX MICROSCOPIC
Bilirubin Urine: NEGATIVE
GLUCOSE, UA: NEGATIVE mg/dL
HGB URINE DIPSTICK: NEGATIVE
Ketones, ur: NEGATIVE mg/dL
Leukocytes, UA: NEGATIVE
Nitrite: NEGATIVE
PH: 7 (ref 5.0–8.0)
PROTEIN: NEGATIVE mg/dL
Specific Gravity, Urine: 1.013 (ref 1.005–1.030)

## 2017-04-24 LAB — COMPREHENSIVE METABOLIC PANEL
ALT: 16 U/L (ref 14–54)
AST: 15 U/L (ref 15–41)
Albumin: 3.8 g/dL (ref 3.5–5.0)
Alkaline Phosphatase: 136 U/L — ABNORMAL HIGH (ref 38–126)
Anion gap: 7 (ref 5–15)
BUN: 8 mg/dL (ref 6–20)
CHLORIDE: 106 mmol/L (ref 101–111)
CO2: 26 mmol/L (ref 22–32)
CREATININE: 0.85 mg/dL (ref 0.44–1.00)
Calcium: 8.9 mg/dL (ref 8.9–10.3)
GFR calc Af Amer: >60 mL/min (ref >60)
GFR calc non Af Amer: >60 mL/min (ref >60)
Glucose, Bld: 101 mg/dL — ABNORMAL HIGH (ref 65–99)
POTASSIUM: 3.3 mmol/L — AB (ref 3.5–5.1)
SODIUM: 139 mmol/L (ref 135–145)
Total Bilirubin: 0.3 mg/dL (ref 0.3–1.2)
Total Protein: 7.6 g/dL (ref 6.5–8.1)

## 2017-04-24 LAB — LIPASE, BLOOD: Lipase: 21 U/L (ref 11–51)

## 2017-04-24 MED ORDER — IOPAMIDOL (ISOVUE-300) INJECTION 61%
100.0000 mL | Freq: Once | INTRAVENOUS | Status: AC | PRN
Start: 2017-04-24 — End: 2017-04-24
  Administered 2017-04-24: 100 mL via INTRAVENOUS

## 2017-04-24 NOTE — Discharge Instructions (Signed)
Take your usual prescriptions as previously directed.  Call your regular medical doctor today to schedule a follow up appointment within the next 2 days. Call the General Surgeon today to schedule a follow up appointment within the next week.  Return to the Emergency Department immediately sooner if worsening.

## 2017-04-24 NOTE — ED Provider Notes (Signed)
[facility] DEPT Provider Note   CSN: [id number] Arrival date & time: 04/24/17  1027     History   Chief Complaint Chief Complaint  Patient presents with  . Abdominal Pain    HPI Annette Bryant is a 56 y.o. female.  HPI  Pt was seen at 1055.  Per pt, c/o gradual onset and persistence of constant generalized abd "bloating" for the past 3 days.  Has been associated with no other symptoms.  Denies abd pain, N/V, no diarrhea, no fevers, no back pain, no rash, no CP/SOB, no black or blood in stools.      Past Medical History:  Diagnosis Date  . Arthritis   . Asthma   . Carpal tunnel syndrome   . COPD (chronic obstructive pulmonary disease) (HCC)   . Depression   . Dyspnea    a. Adm 12/2013 for dyspnea/palps: normal stress-pics-only Lexiscan nuc, echo unremarkable with normal EF, d-dimer normal, ruled out for MI.  Marland Kitchen Elevated lipids   . Hyperglycemia   . Hypertension   . Morbid obesity (HCC)   . Panic attacks   . Sleep apnea    by clinical history    Patient Active Problem List   Diagnosis Date Noted  . Right hand pain 01/19/2016  . Left hand pain 01/19/2016  . Bilateral hand pain 01/19/2016  . Morbid obesity (HCC)   . Elevated lipids   . Hyperglycemia   . Hypertension   . Depression   . Sleep apnea   . Dyspnea 12/11/2013  . Heart palpitations 12/11/2013  . Dizziness 12/11/2013    Past Surgical History:  Procedure Laterality Date  . CESAREAN SECTION    . TUBAL LIGATION      OB History    No data available       Home Medications    Prior to Admission medications   Medication Sig Start Date End Date Taking? Authorizing Provider  albuterol (PROVENTIL) (2.5 MG/3ML) 0.083% nebulizer solution Take by nebulization every 6 (six) hours as needed for wheezing or shortness of breath.   Yes [provider]  ALPRAZolam Prudy Feeler) 1 MG tablet Take 1 tablet (1 mg total) by mouth 2 (two) times daily. 02/20/17  Yes Myrlene Broker, MD  atorvastatin (LIPITOR)  40 MG tablet Take 1 tablet by mouth daily. 03/27/17  Yes [provider]  Budesonide-Formoterol Fumarate (SYMBICORT IN) Inhale 2 puffs into the lungs daily.    Yes [provider]  cyclobenzaprine (FLEXERIL) 5 MG tablet Take 5 mg by mouth 2 (two) times daily as needed. 05/10/16  Yes [provider]  DULoxetine (CYMBALTA) 60 MG capsule Take 1 capsule (60 mg total) by mouth 2 (two) times daily. 02/20/17  Yes Myrlene Broker, MD  furosemide (LASIX) 40 MG tablet Take 40 mg by mouth daily.   Yes [provider]  HYDROcodone-acetaminophen (NORCO) 7.5-325 MG tablet TAKE HALF TO 1 TABLET BY MOUTH UP TO 3 TIMES A DAY AS NEEDED FOR SEVERE PAIN 01/18/17  Yes [provider]  naproxen (NAPROSYN) 500 MG tablet Take 1 tablet (500 mg total) by mouth 2 (two) times daily. Patient taking differently: Take 500 mg by mouth 2 (two) times daily as needed.  08/09/15  Yes Horton, Mayer Masker, MD  PROAIR HFA 108 (90 BASE) MCG/ACT inhaler Inhale 1-2 puffs into the lungs every 6 (six) hours as needed for wheezing or shortness of breath.  06/27/15  Yes [provider]  zolpidem (AMBIEN) 10 MG tablet TAKE 1 TABLET  BY MOUTH AT BEDTIME FOR SLEEP. 01/25/17  Yes [provider]  pregabalin (LYRICA) 25 MG capsule Take 25 mg by mouth 2 (two) times daily.    [provider]    Family History Family History  Problem Relation Age of Onset  . Coronary artery disease Sister 40       stents  . Depression Sister   . Coronary artery disease Sister   . Heart attack Sister 77       CHF, MI  . Schizophrenia Brother   . Alcohol abuse Brother   . Depression Mother   . Alcohol abuse Father     Social History Social History  Substance Use Topics  . Smoking status: Never Smoker  . Smokeless tobacco: Never Used  . Alcohol use No     Allergies   Patient has no known allergies.   Review of Systems Review of Systems ROS: Statement: All systems negative except as  marked or noted in the HPI; Constitutional: Negative for fever and chills. ; ; Eyes: Negative for eye pain, redness and discharge. ; ; ENMT: Negative for ear pain, hoarseness, nasal congestion, sinus pressure and sore throat. ; ; Cardiovascular: Negative for chest pain, palpitations, diaphoresis, dyspnea and peripheral edema. ; ; Respiratory: Negative for cough, wheezing and stridor. ; ; Gastrointestinal: +abd "bloating."  Negative for nausea, vomiting, diarrhea, abdominal pain, blood in stool, hematemesis, jaundice and rectal bleeding. . ; ; Genitourinary: Negative for dysuria, flank pain and hematuria. ; ; Musculoskeletal: Negative for back pain and neck pain. Negative for swelling and trauma.; ; Skin: Negative for pruritus, rash, abrasions, blisters, bruising and skin lesion.; ; Neuro: Negative for headache, lightheadedness and neck stiffness. Negative for weakness, altered level of consciousness, altered mental status, extremity weakness, paresthesias, involuntary movement, seizure and syncope.       Physical Exam Updated Vital Signs BP 139/89 (BP Location: Right Arm)   Pulse 94   Temp 97.9 F (36.6 C) (Oral)   Resp 19   Ht 5\' 7"  (1.702 m)   Wt 260 lb (117.9 kg)   LMP 01/23/2016   SpO2 96%   BMI 40.72 kg/m   Physical Exam 1100: Physical examination:  Nursing notes reviewed; Vital signs and O2 SAT reviewed;  Constitutional: Well developed, Well nourished, Well hydrated, In no acute distress; Head:  Normocephalic, atraumatic; Eyes: EOMI, PERRL, No scleral icterus; ENMT: Mouth and pharynx normal, Mucous membranes moist; Neck: Supple, Full range of motion, No lymphadenopathy; Cardiovascular: Regular rate and rhythm, No gallop; Respiratory: Breath sounds clear & equal bilaterally, No wheezes.  Speaking full sentences with ease, Normal respiratory effort/excursion; Chest: Nontender, Movement normal; Abdomen: Soft, large, Nontender, Nondistended, Normal bowel sounds; Genitourinary: No CVA  tenderness; Extremities: Pulses normal, No tenderness, No edema, No calf edema or asymmetry.; Neuro: AA&Ox3, Major CN grossly intact.  Speech clear. No gross focal motor or sensory deficits in extremities.; Skin: Color normal, Warm, Dry.; Psych:  Anxious, rapid/pressured speech.    ED Treatments / Results  Labs (all labs ordered are listed, but only abnormal results are displayed)   EKG  EKG Interpretation None       Radiology   Procedures Procedures (including critical care time)  Medications Ordered in ED Medications  iopamidol (ISOVUE-300) 61 % injection 100 mL (100 mLs Intravenous Contrast Given 04/24/17 1255)     Initial Impression / Assessment and Plan / ED Course  I have reviewed the triage vital signs and the nursing notes.  Pertinent labs & imaging results  that were available during my care of the patient were reviewed by me and considered in my medical decision making (see chart for details).  MDM Reviewed: previous chart, nursing note and vitals Reviewed previous: labs Interpretation: labs and CT scan   Results for orders placed or performed during the hospital encounter of 04/24/17  Comprehensive metabolic panel  Result Value Ref Range   Sodium 139 135 - 145 mmol/L   Potassium 3.3 (L) 3.5 - 5.1 mmol/L   Chloride 106 101 - 111 mmol/L   CO2 26 22 - 32 mmol/L   Glucose, Bld 101 (H) 65 - 99 mg/dL   BUN 8 6 - 20 mg/dL   Creatinine, Ser 1.61 0.44 - 1.00 mg/dL   Calcium 8.9 8.9 - 09.6 mg/dL   Total Protein 7.6 6.5 - 8.1 g/dL   Albumin 3.8 3.5 - 5.0 g/dL   AST 15 15 - 41 U/L   ALT 16 14 - 54 U/L   Alkaline Phosphatase 136 (H) 38 - 126 U/L   Total Bilirubin 0.3 0.3 - 1.2 mg/dL   GFR calc non Af Amer >60 >60 mL/min   GFR calc Af Amer >60 >60 mL/min   Anion gap 7 5 - 15  Lipase, blood  Result Value Ref Range   Lipase 21 11 - 51 U/L  CBC with Differential  Result Value Ref Range   WBC 7.5 4.0 - 10.5 K/uL   RBC 5.00 3.87 - 5.11 MIL/uL   Hemoglobin 13.2  12.0 - 15.0 g/dL   HCT 04.5 40.9 - 81.1 %   MCV 88.0 78.0 - 100.0 fL   MCH 26.4 26.0 - 34.0 pg   MCHC 30.0 30.0 - 36.0 g/dL   RDW 91.4 (H) 78.2 - 95.6 %   Platelets 284 150 - 400 K/uL   Neutrophils Relative % 58 %   Neutro Abs 4.4 1.7 - 7.7 K/uL   Lymphocytes Relative 33 %   Lymphs Abs 2.5 0.7 - 4.0 K/uL   Monocytes Relative 8 %   Monocytes Absolute 0.6 0.1 - 1.0 K/uL   Eosinophils Relative 1 %   Eosinophils Absolute 0.1 0.0 - 0.7 K/uL   Basophils Relative 0 %   Basophils Absolute 0.0 0.0 - 0.1 K/uL  Urinalysis, Routine w reflex microscopic  Result Value Ref Range   Color, Urine YELLOW YELLOW   APPearance HAZY (A) CLEAR   Specific Gravity, Urine 1.013 1.005 - 1.030   pH 7.0 5.0 - 8.0   Glucose, UA NEGATIVE NEGATIVE mg/dL   Hgb urine dipstick NEGATIVE NEGATIVE   Bilirubin Urine NEGATIVE NEGATIVE   Ketones, ur NEGATIVE NEGATIVE mg/dL   Protein, ur NEGATIVE NEGATIVE mg/dL   Nitrite NEGATIVE NEGATIVE   Leukocytes, UA NEGATIVE NEGATIVE    Ct Abdomen Pelvis W Contrast Result Date: 04/24/2017 CLINICAL DATA:  Initial evaluation for acute abdominal pain, distension. EXAM: CT ABDOMEN AND PELVIS WITH CONTRAST TECHNIQUE: Multidetector CT imaging of the abdomen and pelvis was performed using the standard protocol following bolus administration of intravenous contrast. CONTRAST:  ISOVUE-300 IOPAMIDOL (ISOVUE-300) INJECTION 61% COMPARISON:  Prior chest CT from 06/20/2015. FINDINGS: Lower chest: Tiny 4 mm nodule positioned along the left hemidiaphragm is stable from prior CT. Visualized lung bases are otherwise clear. Hepatobiliary: Liver demonstrates a normal contrast enhanced appearance. Multiple stones present within the gallbladder lumen, largest of which measures 2 cm. No CT evidence for acute cholecystitis. No biliary dilatation. Pancreas: Pancreas within normal limits. Spleen: Spleen within normal limits.  The Adrenals/Urinary  Tract: Adrenal glands are normal. Kidneys equal in size with  symmetric enhancement. 14 mm cyst noted within the upper pole of the right kidney. No nephrolithiasis, hydronephrosis, or focal enhancing renal mass. No hydroureter. Partially distended bladder within normal limits. Stomach/Bowel: Stomach within normal limits. No evidence for bowel obstruction. Appendix normal. Ventral/ paraumbilical hernia containing fat and loops of small bowel present. No associated obstruction or inflammation. Colonic diverticulosis without evidence for acute diverticulitis. No acute inflammatory changes seen about the bowels. Vascular/Lymphatic: Normal intravascular enhancement seen throughout the intra-abdominal aorta and its branch vessels. No adenopathy. Reproductive: Uterus and ovaries within normal limits. 1 cm cyst noted within the left ovary, of doubtful significance. Other: No free air or fluid. Musculoskeletal: No acute osseous abnormality. No worrisome lytic or blastic osseous lesions. Multilevel facet arthrosis noted within the lower lumbar spine. Degenerative osteoarthritic changes noted about the hips bilaterally. IMPRESSION: 1. No CT evidence for acute intra-abdominal or pelvic process. 2. Ventral/paraumbilical hernia containing fat and loops of small bowel without associated obstruction or inflammation. 3. Colonic diverticulosis without evidence for acute diverticulitis. 4. Cholelithiasis. 5. 4 mm pleural-based nodule at the left lung base, stable relative to prior chest CT from 06/20/2015. Given stability of this finding, this is most likely benign in nature. Electronically Signed   By: Rise MuBenjamin  McClintock M.D.   On: 04/24/2017 13:28    1350:  Potassium repleted PO. CT reassuring. Pt has tol PO well while in the ED without N/V.  No stooling while in the ED.  Abd remains benign, VSS.  Pt reassured. Ready to go home now. Dx and testing d/w pt.  Questions answered.  Verb understanding, agreeable to d/c home with outpt f/u.    Final Clinical Impressions(s) / ED Diagnoses    Final diagnoses:  None    New Prescriptions New Prescriptions   No medications on file     Samuel JesterMcManus, Haru Anspaugh, DO 04/30/17 0740

## 2017-04-24 NOTE — ED Triage Notes (Signed)
Pt c/o lower abd pain "swelling" x 3 days. Denies n/v/d. States she feels fluid in her belly. Denies vaginal issues. lnbm this am.

## 2017-04-24 NOTE — ED Notes (Signed)
Pt to CT at this time.

## 2017-04-25 ENCOUNTER — Other Ambulatory Visit (HOSPITAL_COMMUNITY): Payer: Self-pay | Admitting: Neurology

## 2017-04-25 DIAGNOSIS — G9600 Cerebrospinal fluid leak, unspecified: Secondary | ICD-10-CM

## 2017-04-28 ENCOUNTER — Encounter (HOSPITAL_COMMUNITY): Payer: Self-pay | Admitting: *Deleted

## 2017-04-28 DIAGNOSIS — I10 Essential (primary) hypertension: Secondary | ICD-10-CM | POA: Insufficient documentation

## 2017-04-28 DIAGNOSIS — J449 Chronic obstructive pulmonary disease, unspecified: Secondary | ICD-10-CM | POA: Diagnosis not present

## 2017-04-28 DIAGNOSIS — Z79899 Other long term (current) drug therapy: Secondary | ICD-10-CM | POA: Insufficient documentation

## 2017-04-28 DIAGNOSIS — K439 Ventral hernia without obstruction or gangrene: Secondary | ICD-10-CM | POA: Insufficient documentation

## 2017-04-28 DIAGNOSIS — J45909 Unspecified asthma, uncomplicated: Secondary | ICD-10-CM | POA: Diagnosis not present

## 2017-04-28 DIAGNOSIS — M546 Pain in thoracic spine: Secondary | ICD-10-CM | POA: Diagnosis not present

## 2017-04-28 NOTE — ED Triage Notes (Signed)
Pt c/o upper back pain after getting out of church today; pt states the pain has gotten progressively worse and has taken a hydrocodone without relief

## 2017-04-29 ENCOUNTER — Emergency Department (HOSPITAL_COMMUNITY)
Admission: EM | Admit: 2017-04-29 | Discharge: 2017-04-29 | Disposition: A | Discharge status: Home / Self Care | Payer: Medicaid Other | Attending: Emergency Medicine | Admitting: Emergency Medicine

## 2017-04-29 DIAGNOSIS — M546 Pain in thoracic spine: Secondary | ICD-10-CM

## 2017-04-29 LAB — URINALYSIS, ROUTINE W REFLEX MICROSCOPIC
Glucose, UA: NEGATIVE mg/dL
HGB URINE DIPSTICK: NEGATIVE
Ketones, ur: NEGATIVE mg/dL
Leukocytes, UA: NEGATIVE
NITRITE: NEGATIVE
pH: 5 (ref 5.0–8.0)

## 2017-04-29 LAB — URINALYSIS, MICROSCOPIC (REFLEX): WBC UA: NONE SEEN WBC/hpf (ref 0–5)

## 2017-04-29 MED ORDER — METHOCARBAMOL 500 MG PO TABS: 1000.0000 mg | ORAL_TABLET | Freq: Three times a day (TID) | ORAL | 0 refills | Status: DC | PRN

## 2017-04-29 MED ORDER — METHOCARBAMOL 500 MG PO TABS
1000.0000 mg | ORAL_TABLET | Freq: Once | ORAL | Status: AC
  Administered 2017-04-29: 1000 mg via ORAL
  Filled 2017-04-29: qty 2

## 2017-04-29 MED ORDER — HYDROMORPHONE HCL 2 MG/ML IJ SOLN
2.0000 mg | Freq: Once | INTRAMUSCULAR | Status: AC
  Administered 2017-04-29: 2 mg via INTRAMUSCULAR
  Filled 2017-04-29: qty 1

## 2017-04-29 NOTE — ED Provider Notes (Signed)
[facility] Provider Note   CSN: [id number] Arrival date & time: 04/28/17  2306     History   Chief Complaint Chief Complaint  Patient presents with  . Back Pain    HPI Annette Bryant is a 56 y.o. female.  HPI  56 year old female presents with severe back pain. This is right-sided and thoracic. She states this pain has been going on for about one month. It comes and goes. However it has never been this bad. Started this afternoon after church. There is no specific injury. A type of minimal movement causes severe pain. She has not been able to get up and walk due to the pain. However she has not noticed any leg weakness or numbness or bowel or bladder incontinence. No urinary symptoms. She still has residual pain where she was found to have a ventral hernia but this is not worse or new. No hematuria. No fevers. No cough or shortness of breath. Took hydrocodone earlier this evening without relief.  Past Medical History:  Diagnosis Date  . Arthritis   . Asthma   . Carpal tunnel syndrome   . COPD (chronic obstructive pulmonary disease) (HCC)   . Depression   . Dyspnea    a. Adm 12/2013 for dyspnea/palps: normal stress-pics-only Lexiscan nuc, echo unremarkable with normal EF, d-dimer normal, ruled out for MI.  Marland Kitchen Elevated lipids   . Hyperglycemia   . Hypertension   . Morbid obesity (HCC)   . Panic attacks   . Sleep apnea    by clinical history    Patient Active Problem List   Diagnosis Date Noted  . Right hand pain 01/19/2016  . Left hand pain 01/19/2016  . Bilateral hand pain 01/19/2016  . Morbid obesity (HCC)   . Elevated lipids   . Hyperglycemia   . Hypertension   . Depression   . Sleep apnea   . Dyspnea 12/11/2013  . Heart palpitations 12/11/2013  . Dizziness 12/11/2013    Past Surgical History:  Procedure Laterality Date  . CESAREAN SECTION    . TUBAL LIGATION      OB History    No data available       Home Medications    Prior to Admission  medications   Medication Sig Start Date End Date Taking? Authorizing Provider  albuterol (PROVENTIL) (2.5 MG/3ML) 0.083% nebulizer solution Take by nebulization every 6 (six) hours as needed for wheezing or shortness of breath.    [provider]  ALPRAZolam Prudy Feeler) 1 MG tablet Take 1 tablet (1 mg total) by mouth 2 (two) times daily. 02/20/17   Myrlene Broker, MD  atorvastatin (LIPITOR) 40 MG tablet Take 1 tablet by mouth daily. 03/27/17   [provider]  Budesonide-Formoterol Fumarate (SYMBICORT IN) Inhale 2 puffs into the lungs daily.     [provider]  DULoxetine (CYMBALTA) 60 MG capsule Take 1 capsule (60 mg total) by mouth 2 (two) times daily. 02/20/17   Myrlene Broker, MD  furosemide (LASIX) 40 MG tablet Take 40 mg by mouth daily.    [provider]  HYDROcodone-acetaminophen (NORCO) 7.5-325 MG tablet TAKE HALF TO 1 TABLET BY MOUTH UP TO 3 TIMES A DAY AS NEEDED FOR SEVERE PAIN 01/18/17   [provider]  methocarbamol (ROBAXIN) 500 MG tablet Take 2 tablets (1,000 mg total) by mouth every 8 (eight) hours as needed for muscle spasms. 04/29/17   Pricilla Loveless, MD  naproxen (NAPROSYN) 500 MG tablet Take 1 tablet (500  mg total) by mouth 2 (two) times daily. Patient taking differently: Take 500 mg by mouth 2 (two) times daily as needed.  08/09/15   Horton, Mayer Masker, MD  pregabalin (LYRICA) 25 MG capsule Take 25 mg by mouth 2 (two) times daily.    [provider]  PROAIR HFA 108 (90 BASE) MCG/ACT inhaler Inhale 1-2 puffs into the lungs every 6 (six) hours as needed for wheezing or shortness of breath.  06/27/15   [provider]  zolpidem (AMBIEN) 10 MG tablet TAKE 1 TABLET BY MOUTH AT BEDTIME FOR SLEEP. 01/25/17   [provider]    Family History Family History  Problem Relation Age of Onset  . Coronary artery disease Sister 40       stents  . Depression Sister   . Coronary artery disease Sister   . Heart attack Sister  45       CHF, MI  . Schizophrenia Brother   . Alcohol abuse Brother   . Depression Mother   . Alcohol abuse Father     Social History Social History  Substance Use Topics  . Smoking status: Never Smoker  . Smokeless tobacco: Never Used  . Alcohol use No     Allergies   Patient has no known allergies.   Review of Systems Review of Systems  Gastrointestinal: Negative for abdominal pain and vomiting.  Genitourinary: Negative for dysuria and hematuria.  Musculoskeletal: Positive for back pain.  Neurological: Negative for weakness and numbness.  All other systems reviewed and are negative.    Physical Exam Updated Vital Signs BP (!) 149/87 (BP Location: Left Arm)   Pulse 63   Temp 98.7 F (37.1 C) (Oral)   Resp 18   Ht 5\' 7"  (1.702 m)   Wt 260 lb (117.9 kg)   LMP 01/23/2016   SpO2 97%   BMI 40.72 kg/m   Physical Exam  Constitutional: She is oriented to person, place, and time. She appears well-developed and well-nourished. She appears distressed (in severe pain).  obese  HENT:  Head: Normocephalic and atraumatic.  Right Ear: External ear normal.  Left Ear: External ear normal.  Nose: Nose normal.  Eyes: Right eye exhibits no discharge. Left eye exhibits no discharge.  Cardiovascular: Normal rate, regular rhythm and normal heart sounds.   Pulmonary/Chest: Effort normal and breath sounds normal.  Abdominal: Soft. There is no tenderness. A hernia is present. Hernia confirmed positive in the ventral area.  Musculoskeletal:       Thoracic back: She exhibits tenderness. She exhibits no bony tenderness.       Back:  Minimal movements/rotation of her back cause severe pain  Neurological: She is alert and oriented to person, place, and time.  5/5 strength in BLE, normal gross senation  Skin: Skin is warm and dry.  Nursing note and vitals reviewed.    ED Treatments / Results  Labs (all labs ordered are listed, but only abnormal results are displayed) Labs  Reviewed  URINALYSIS, ROUTINE W REFLEX MICROSCOPIC - Abnormal; Notable for the following:       Result Value   Specific Gravity, Urine >1.030 (*)    Bilirubin Urine SMALL (*)    Protein, ur TRACE (*)    All other components within normal limits  URINALYSIS, MICROSCOPIC (REFLEX) - Abnormal; Notable for the following:    Bacteria, UA MANY (*)    Squamous Epithelial / LPF 0-5 (*)    All other components within normal limits  EKG  EKG Interpretation None       Radiology No results found.  Procedures Procedures (including critical care time)  Medications Ordered in ED Medications  HYDROmorphone (DILAUDID) injection 2 mg (2 mg Intramuscular Given 04/29/17 0137)  methocarbamol (ROBAXIN) tablet 1,000 mg (1,000 mg Oral Given 04/29/17 0333)     Initial Impression / Assessment and Plan / ED Course  I have reviewed the triage vital signs and the nursing notes.  Pertinent labs & imaging results that were available during my care of the patient were reviewed by me and considered in my medical decision making (see chart for details).     After IM Dilaudid, patient is now resting much more comfortable. She is able to get up and walk with her cane like normal. No signs of neurologic compromise. There is no midline pain. Likely this is muscular in etiology. She had a CT scan done about 5 days ago which was reviewed. Of note there is no renal calculi. I suspicion this is renal colic is low. Her urine has bacteria in it but no leukocytes, nitrites, or hematuria. This seems to be asymptomatic bacteriuria. She is on Flexeril chronically at home, I will switch this to Robaxin given she does not feel this is helping. Continue NSAIDs at her home hydrocodone. Follow-up closely with PCP. Strict return precautions.  Final Clinical Impressions(s) / ED Diagnoses   Final diagnoses:  Acute right-sided thoracic back pain    New Prescriptions Discharge Medication List as of 04/29/2017  5:12 AM      START taking these medications   Details  methocarbamol (ROBAXIN) 500 MG tablet Take 2 tablets (1,000 mg total) by mouth every 8 (eight) hours as needed for muscle spasms., Starting Mon 04/29/2017, Print         Pricilla LovelessGoldston, Brunella Wileman, MD 04/29/17 [phone]

## 2017-04-29 NOTE — ED Notes (Signed)
Pt states she feels much better and now rates pain 0/10. Pt a&o x 4, vss, RX and verbal and written discharge instructions given to pt and dtr, both verbalized understanding, pt ambulated off unit using cane (normally uses from home) in stable condition.

## 2017-04-29 NOTE — ED Notes (Signed)
Pt has been informed of need for urine sample but states she cannot go at this time.

## 2017-04-29 NOTE — ED Notes (Signed)
Pt ambulated to bathroom with steady gait. 

## 2017-05-03 ENCOUNTER — Inpatient Hospital Stay (HOSPITAL_COMMUNITY): Admission: RE | Admit: 2017-05-03 | Payer: Self-pay | Source: Ambulatory Visit

## 2017-05-03 ENCOUNTER — Encounter (HOSPITAL_COMMUNITY): Payer: Self-pay

## 2017-05-03 ENCOUNTER — Ambulatory Visit (HOSPITAL_COMMUNITY): Payer: Medicaid Other

## 2017-05-19 IMAGING — DX DG SHOULDER 2+V*R*
3 series · 3 of 3 positions shown · non-contrast
Comparison: None.

CLINICAL DATA: Awoke with right shoulder pain.  Initial encounter.

EXAM:
RIGHT SHOULDER - 2+ VIEW

[shoulder y view]
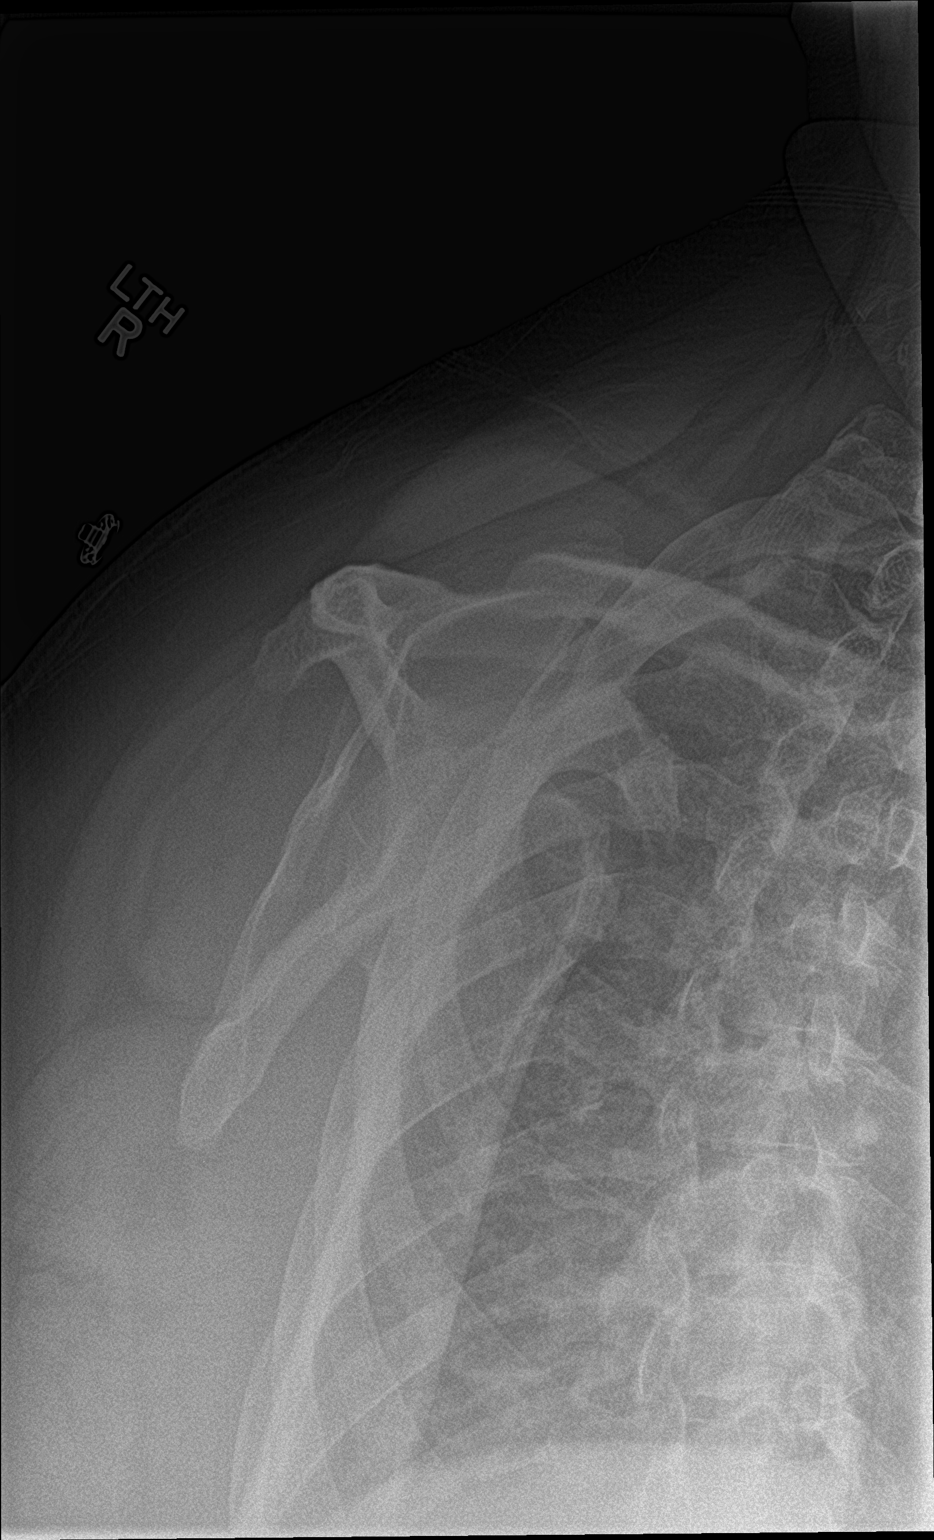

[shoulder grashey]
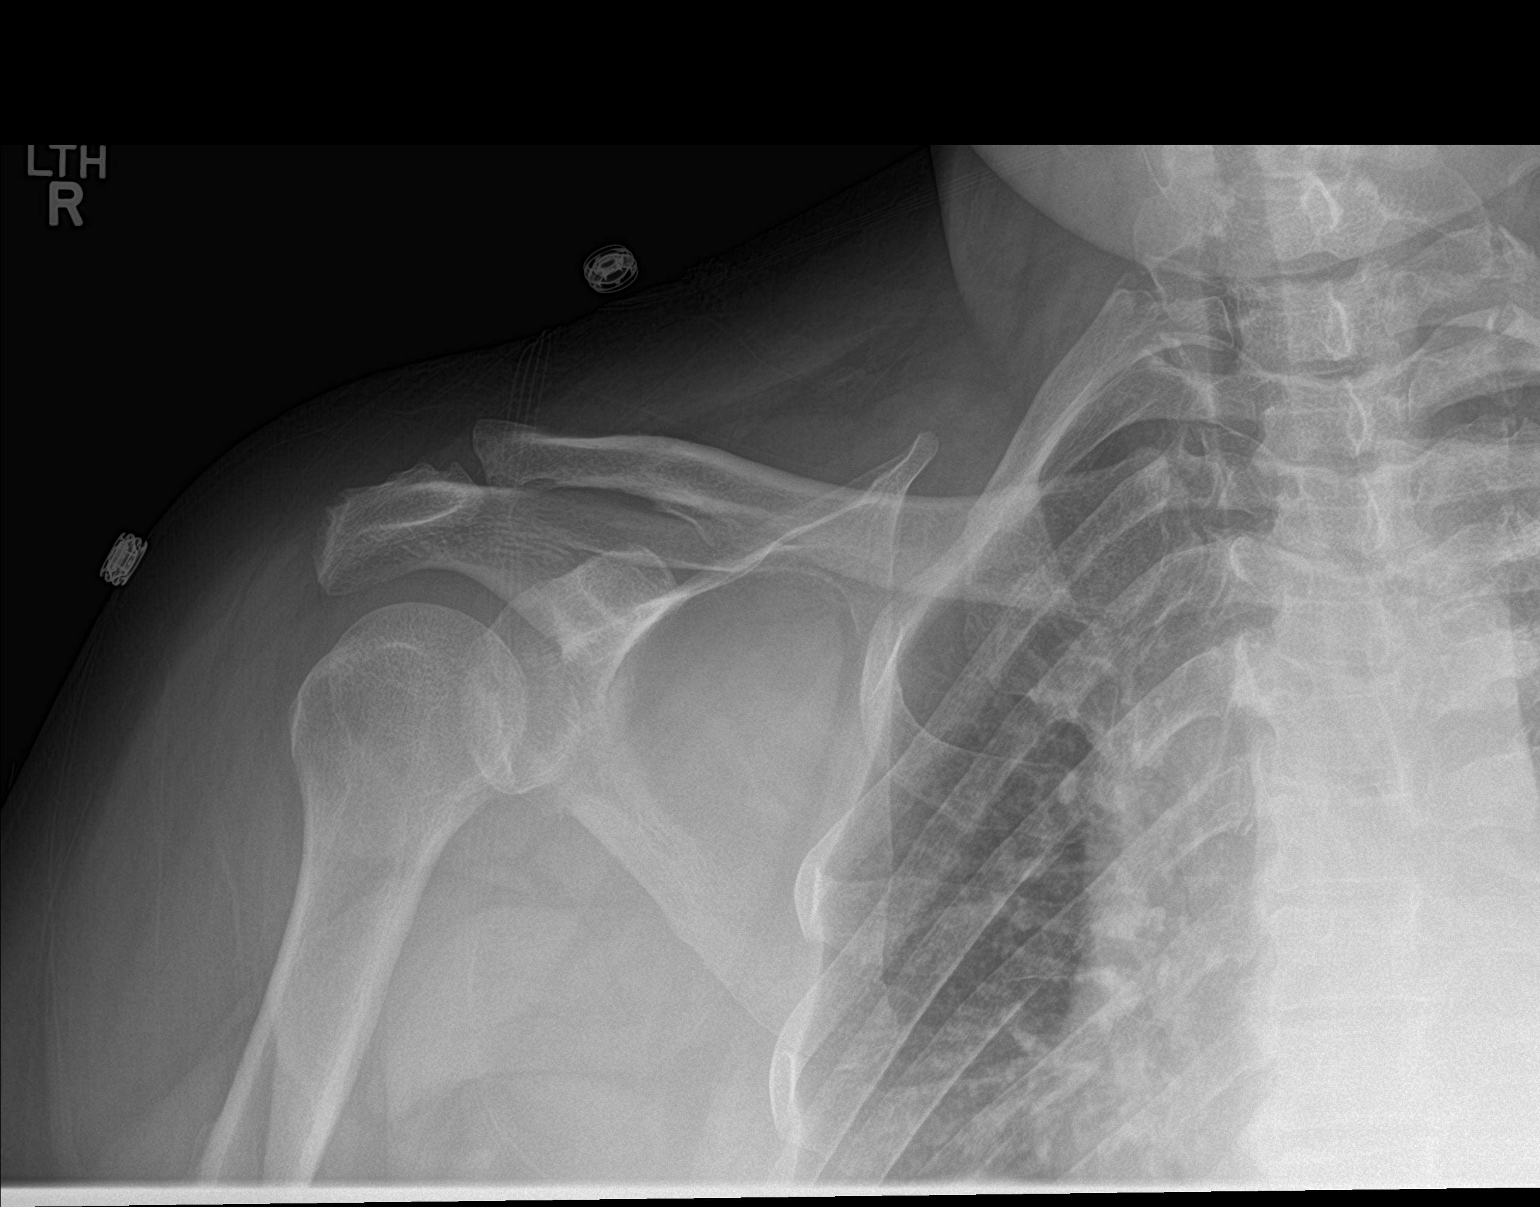

[shoulder axillary]
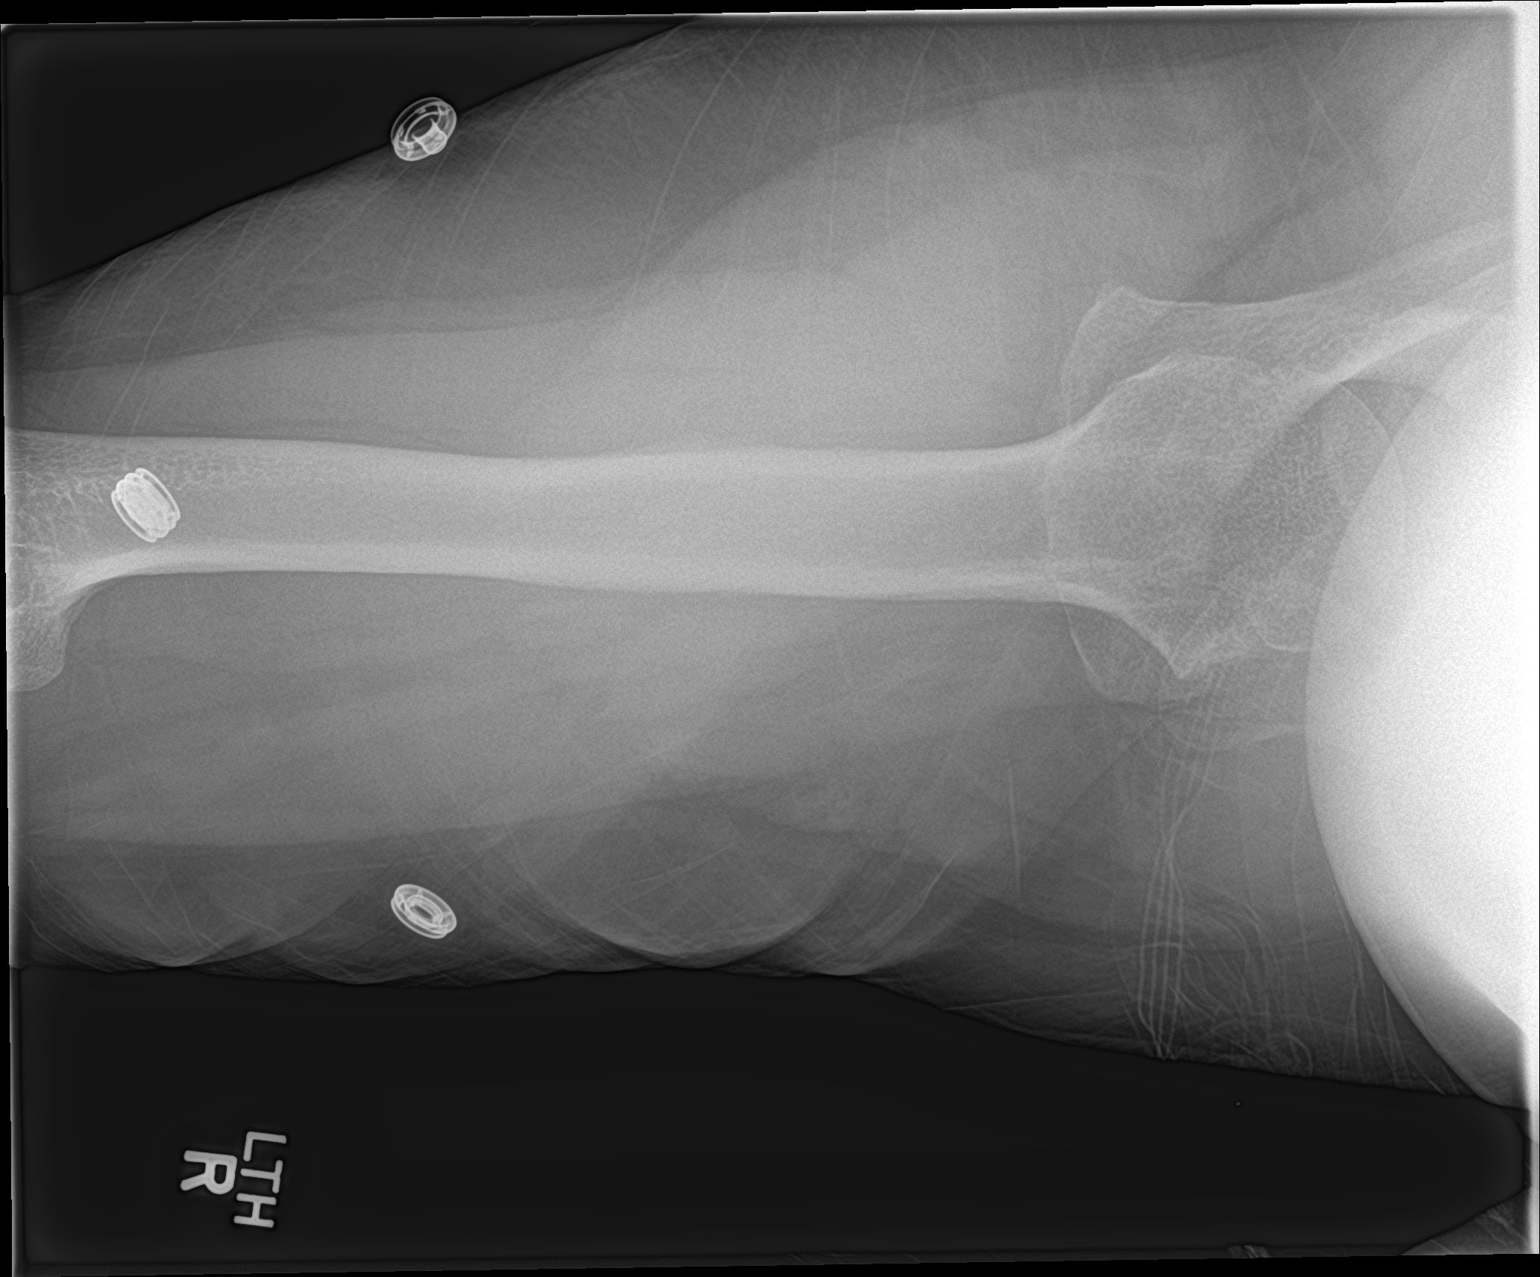

[3 of 3 positions shown; findings below may reference images not displayed]

FINDINGS: No dislocation or acute fracture.

Mild degenerative spurring about the acromioclavicular joint without
subacromial narrowing. Mild marginal spurring at the glenohumeral
joint. Limited axillary view but no suspected glenohumeral joint
narrowing. No soft tissue ossification.
IMPRESSION: No explanation for acute shoulder pain.

## 2017-05-23 ENCOUNTER — Ambulatory Visit (INDEPENDENT_AMBULATORY_CARE_PROVIDER_SITE_OTHER): Payer: Medicaid Other | Admitting: Psychiatry

## 2017-05-23 ENCOUNTER — Encounter (HOSPITAL_COMMUNITY): Payer: Self-pay | Admitting: Psychiatry

## 2017-05-23 VITALS — BP 166/100 | HR 92 | Ht 67.0 in | Wt 277.0 lb

## 2017-05-23 DIAGNOSIS — F4310 Post-traumatic stress disorder, unspecified: Secondary | ICD-10-CM

## 2017-05-23 DIAGNOSIS — Z791 Long term (current) use of non-steroidal anti-inflammatories (NSAID): Secondary | ICD-10-CM

## 2017-05-23 DIAGNOSIS — F332 Major depressive disorder, recurrent severe without psychotic features: Secondary | ICD-10-CM

## 2017-05-23 DIAGNOSIS — Z79891 Long term (current) use of opiate analgesic: Secondary | ICD-10-CM

## 2017-05-23 DIAGNOSIS — F419 Anxiety disorder, unspecified: Secondary | ICD-10-CM

## 2017-05-23 DIAGNOSIS — Z7951 Long term (current) use of inhaled steroids: Secondary | ICD-10-CM

## 2017-05-23 DIAGNOSIS — Z79899 Other long term (current) drug therapy: Secondary | ICD-10-CM | POA: Diagnosis not present

## 2017-05-23 DIAGNOSIS — Z818 Family history of other mental and behavioral disorders: Secondary | ICD-10-CM | POA: Diagnosis not present

## 2017-05-23 DIAGNOSIS — Z811 Family history of alcohol abuse and dependence: Secondary | ICD-10-CM

## 2017-05-23 MED ORDER — ZOLPIDEM TARTRATE 10 MG PO TABS: ORAL_TABLET | ORAL | 2 refills | Status: DC

## 2017-05-23 MED ORDER — DULOXETINE HCL 60 MG PO CPEP: 60.0000 mg | ORAL_CAPSULE | Freq: Two times a day (BID) | ORAL | 2 refills | Status: DC

## 2017-05-23 MED ORDER — ALPRAZOLAM 1 MG PO TABS: 1.0000 mg | ORAL_TABLET | Freq: Two times a day (BID) | ORAL | 2 refills | Status: DC
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
## 2017-07-09 ENCOUNTER — Ambulatory Visit: Payer: Self-pay | Admitting: Surgery

## 2017-07-09 NOTE — H&P (Signed)
History of Present Illness Annette Bryant. Annette Dabbs MD; 07/09/2017 5:50 PM) The patient is a 56 year old female who presents for evaluation of gall stones. PCP - Dr. Sudie Bailey Reason for consultation: Ventral hernia  This is a 56 year old female with multiple medical problems who presents with about 5 months of abdominal bloating and distention. This has been worse recently. She has had occasional periumbilical abdominal pain. She denies any diarrhea. She has occasional constipation. She has had 2 recent visits to the emergency department. She has also has some chronic low back pain which seems to be worse recently. She underwent a CT scan of the abdomen and pelvis which revealed a large lower abdominal ventral hernia as well as multiple large gallstones within the gallbladder lumen but no evidence of acute cholecystitis. Alkaline phosphatase was mildly elevated at 136 but other liver function tests were normal. CBC was also normal. She presents now primarily to discuss her ventral hernia. She has not noticed any bulging in the lower abdomen. She just has noticed generalized bloating of her entire abdomen.  CLINICAL DATA: Initial evaluation for acute abdominal pain, distension.  EXAM: CT ABDOMEN AND PELVIS WITH CONTRAST  TECHNIQUE: Multidetector CT imaging of the abdomen and pelvis was performed using the standard protocol following bolus administration of intravenous contrast.  CONTRAST: ISOVUE-300 IOPAMIDOL (ISOVUE-300) INJECTION 61%  COMPARISON: Prior chest CT from 06/20/2015.  FINDINGS: Lower chest: Tiny 4 mm nodule positioned along the left hemidiaphragm is stable from prior CT. Visualized lung bases are otherwise clear.  Hepatobiliary: Liver demonstrates a normal contrast enhanced appearance. Multiple stones present within the gallbladder lumen, largest of which measures 2 cm. No CT evidence for acute cholecystitis. No biliary dilatation.  Pancreas: Pancreas within  normal limits.  Spleen: Spleen within normal limits. The  Adrenals/Urinary Tract: Adrenal glands are normal. Kidneys equal in size with symmetric enhancement. 14 mm cyst noted within the upper pole of the right kidney. No nephrolithiasis, hydronephrosis, or focal enhancing renal mass. No hydroureter. Partially distended bladder within normal limits.  Stomach/Bowel: Stomach within normal limits. No evidence for bowel obstruction. Appendix normal. Ventral/ paraumbilical hernia containing fat and loops of small bowel present. No associated obstruction or inflammation. Colonic diverticulosis without evidence for acute diverticulitis. No acute inflammatory changes seen about the bowels.  Vascular/Lymphatic: Normal intravascular enhancement seen throughout the intra-abdominal aorta and its branch vessels. No adenopathy.  Reproductive: Uterus and ovaries within normal limits. 1 cm cyst noted within the left ovary, of doubtful significance.  Other: No free air or fluid.  Musculoskeletal: No acute osseous abnormality. No worrisome lytic or blastic osseous lesions. Multilevel facet arthrosis noted within the lower lumbar spine. Degenerative osteoarthritic changes noted about the hips bilaterally.  IMPRESSION: 1. No CT evidence for acute intra-abdominal or pelvic process. 2. Ventral/paraumbilical hernia containing fat and loops of small bowel without associated obstruction or inflammation. 3. Colonic diverticulosis without evidence for acute diverticulitis. 4. Cholelithiasis. 5. 4 mm pleural-based nodule at the left lung base, stable relative to prior chest CT from 06/20/2015. Given stability of this finding, this is most likely benign in nature.   Electronically Signed By: Rise Mu M.D. On: 04/24/2017 13:28    Past Surgical History (Annette Bryant, CMA; 07/09/2017 1:37 PM) Cesarean Section - 1  Diagnostic Studies History (Annette Bryant, CMA; 07/09/2017 1:37  PM) Colonoscopy never Mammogram 1-3 years ago  Allergies (Annette Bryant, CMA; 07/09/2017 1:38 PM) No Known Drug Allergies 07/09/2017  Medication History (Annette Bryant, CMA; 07/09/2017 1:44 PM) Albuterol (90MCG/ACT Aerosol  Soln, Inhalation) Active. Budesonide-Formoterol Fumarate (160-4.5MCG/ACT Aerosol, Inhalation) Active. Furosemide (40MG  Tablet, Oral) Active. Potassium (75MG  Tablet, Oral) Active. Lyrica (25MG  Capsule, Oral) Active. Xanax (1MG  Tablet, Oral) Active. DULoxetine HCl (60MG  Capsule DR Part, Oral) Active. Zolpidem Tartrate (10MG  Tablet, Oral) Active. Atorvastatin Calcium (10MG  Tablet, Oral) Active. Hydrocodone-Acetaminophen (5-325MG  Tablet, Oral) Active. Naproxen DR (500MG  Tablet DR, Oral) Active. ProAir HFA (108 (90 Base)MCG/ACT Aerosol Soln, Inhalation) Active. Symbicort (160-4.5MCG/ACT Aerosol, Inhalation) Active.  Social History (Annette Bryant, CMA; 07/09/2017 1:37 PM) Caffeine use Carbonated beverages, Coffee, Tea. No alcohol use No drug use Tobacco use Never smoker.  Family History (Annette Bryant, CMA; 07/09/2017 1:37 PM) Alcohol Abuse Father. Arthritis Mother. Cerebrovascular Accident Mother. Depression Mother. Diabetes Mellitus Mother. Hypertension Father.  Pregnancy / Birth History (Annette Bryant, CMA; 07/09/2017 1:37 PM) Age at menarche 14 years. Gravida 4 Maternal age 54-25 Para 4 Regular periods  Other Problems (Annette Bryant, CMA; 07/09/2017 1:37 PM) Anxiety Disorder Arthritis Asthma Back Pain Chronic Obstructive Lung Disease Depression Hypercholesterolemia Sleep Apnea Umbilical Hernia Repair     Review of Systems (Annette Bryant CMA; 07/09/2017 1:37 PM) General Present- Fatigue and Weight Gain. Not Present- Appetite Loss, Chills, Fever, Night Sweats and Weight Loss. Skin Not Present- Change in Wart/Mole, Dryness, Hives, Jaundice, New Lesions, Non-Healing Wounds, Rash and Ulcer. HEENT Present-  Wears glasses/contact lenses. Not Present- Earache, Hearing Loss, Hoarseness, Nose Bleed, Oral Ulcers, Ringing in the Ears, Seasonal Allergies, Sinus Pain, Sore Throat, Visual Disturbances and Yellow Eyes. Respiratory Not Present- Bloody sputum, Chronic Cough, Difficulty Breathing, Snoring and Wheezing. Breast Not Present- Breast Mass, Breast Pain, Nipple Discharge and Skin Changes. Cardiovascular Present- Palpitations, Shortness of Breath and Swelling of Extremities. Not Present- Chest Pain, Difficulty Breathing Lying Down, Leg Cramps and Rapid Heart Rate. Gastrointestinal Present- Constipation and Gets full quickly at meals. Not Present- Abdominal Pain, Bloating, Bloody Stool, Change in Bowel Habits, Chronic diarrhea, Difficulty Swallowing, Excessive gas, Hemorrhoids, Indigestion, Nausea, Rectal Pain and Vomiting. Female Genitourinary Not Present- Frequency, Nocturia, Painful Urination, Pelvic Pain and Urgency. Musculoskeletal Present- Back Pain, Joint Stiffness and Swelling of Extremities. Not Present- Joint Pain, Muscle Pain and Muscle Weakness. Neurological Not Present- Decreased Memory, Fainting, Headaches, Numbness, Seizures, Tingling, Tremor, Trouble walking and Weakness. Psychiatric Present- Anxiety and Depression. Not Present- Bipolar, Change in Sleep Pattern, Fearful and Frequent crying. Endocrine Not Present- Cold Intolerance, Excessive Hunger, Hair Changes, Heat Intolerance, Hot flashes and New Diabetes. Hematology Not Present- Blood Thinners, Easy Bruising, Excessive bleeding, Gland problems, HIV and Persistent Infections.  Vitals (Annette Bryant CMA; 07/09/2017 1:45 PM) 07/09/2017 1:44 PM Weight: 274 lb Height: 67in Body Surface Area: 2.31 m Body Mass Index: 42.91 kg/m  Temp.: 4F  Pulse: 78 (Regular)  BP: 124/82 (Sitting, Left Arm, Standard)      Physical Exam Annette Bryant(Morgan Rennert K. Suriyah Vergara MD; 07/09/2017 5:51 PM)  The physical exam findings are as follows: Note:WDWN in  NAD Eyes: Pupils equal, round; sclera anicteric HENT: Oral mucosa moist; good dentition Neck: No masses palpated, no thyromegaly Lungs: CTA bilaterally; normal respiratory effort CV: Regular rate and rhythm; no murmurs; extremities well-perfused with no edema Abd: +bowel sounds, obese, distended, mildly tender periumbilical, no palpable organomegaly; possible lower infraumbilical ventral palpated with deep pressure but not very obvious due to obesity Skin: Warm, dry; no sign of jaundice Psychiatric - alert and oriented x 4; calm mood and affect    Assessment & Plan Annette Bryant(Annette Bryant K. Clarissa Laird MD; 07/09/2017 5:52 PM)  CALCULUS OF GALLBLADDER WITH CHRONIC CHOLECYSTITIS (K80.10)  Current Plans Schedule for Surgery -  Laparoscopic cholecystectomy with intraoperative cholangiogram. The surgical procedure has been discussed with the patient. Potential risks, benefits, alternative treatments, and expected outcomes have been explained. All of the patient's questions at this time have been answered. The likelihood of reaching the patient's treatment goal is good. The patient understand the proposed surgical procedure and wishes to proceed.  Would recommend proceeding with cholecystectomy first. If we repair the hernia first it will be very difficult to remove the gallbladder later with a large sheet of mesh covering her abdominal wall. Also, her risk of gallbladder cancer is moderately elevated because of the large size of the gallstones. Would recommend removing gallbladder first and thenwe will address the ventral hernia surgery at a later date.  VENTRAL HERNIA WITHOUT OBSTRUCTION OR GANGRENE (K43.9)  Annette ArmsMatthew K. Corliss Skainssuei, MD, [facility]  General/ Trauma Surgery  07/09/2017 5:53 PM

## 2017-07-23 NOTE — Patient Instructions (Addendum)
Cheri KearnsSandra A Margulies  07/23/2017   Your procedure is scheduled on: 08-01-17  [facility]  Entrance [place]  [facility] to 3rd floor to  Short Stay Center at 5:00 AM.   Call this number if you have problems the morning of surgery [phone]    Remember: ONLY 1 PERSON MAY GO WITH YOU TO SHORT STAY TO GET  READY MORNING OF YOUR SURGERY.  Do not eat food or drink liquids :After Midnight.     Take these medicines the morning of surgery with A SIP OF WATER: Duloxetine (Cymbalta). You may also bring and use your inhaler as needed.                                You may not have any metal on your body including hair pins and              piercings  Do not wear jewelry, make-up, lotions, powders or perfumes, deodorant             Do not wear nail polish.  Do not shave  48 hours prior to surgery.  .   Do not bring valuables to the hospital. [place] IS NOT             RESPONSIBLE   FOR VALUABLES.  Contacts, dentures or bridgework may not be worn into surgery.      Patients discharged the day of surgery will not be allowed to drive home.  Name and phone number of your driver: Leone Brandrica Scales [phone]               Please read over the following fact sheets you were given: _____________________________________________________________________             [facility] - Preparing [facility] Before surgery, you can play an important role.  Because skin is not sterile, your skin needs to be as free of germs as possible.  You can reduce the number of germs on your skin by washing with CHG (chlorahexidine gluconate) soap before surgery.  CHG is an antiseptic cleaner which kills germs and bonds with the skin to continue killing germs even after washing. Please DO NOT use if you have an allergy to CHG or antibacterial soaps.  If your skin becomes reddened/irritated stop using the CHG and inform your nurse when you arrive at Short Stay. Do not shave  (including legs and underarms) for at least 48 hours prior to the first CHG shower.  You may shave your face/neck. Please follow these instructions carefully:  1.  Shower with CHG Soap the night before surgery and the  morning of Surgery.  2.  If you choose to wash your hair, wash your hair first as usual with your  normal  shampoo.  3.  After you shampoo, rinse your hair and body thoroughly to remove the  shampoo.                           4.  Use CHG as you would any other liquid soap.  You can apply chg directly  to the skin and wash                       Gently with a scrungie or clean washcloth.  5.  Apply the CHG Soap to your body ONLY FROM THE NECK DOWN.   Do not use on face/ open                           Wound or open sores. Avoid contact with eyes, ears mouth and genitals (private parts).                       Wash face,  Genitals (private parts) with your normal soap.             6.  Wash thoroughly, paying special attention to the area where your surgery  will be performed.  7.  Thoroughly rinse your body with warm water from the neck down.  8.  DO NOT shower/wash with your normal soap after using and rinsing off  the CHG Soap.                9.  Pat yourself dry with a clean towel.            10.  Wear clean pajamas.            11.  Place clean sheets on your bed the night of your first shower and do not  sleep with pets. Day of Surgery : Do not apply any lotions/deodorants the morning of surgery.  Please wear clean clothes to the hospital/surgery center.  FAILURE TO FOLLOW THESE INSTRUCTIONS MAY RESULT IN THE CANCELLATION OF YOUR SURGERY PATIENT SIGNATURE_________________________________  NURSE SIGNATURE__________________________________  ________________________________________________________________________

## 2017-07-23 NOTE — Progress Notes (Signed)
04-24-17 (EPIC) CT Abd Pelvis w/Contrast

## 2017-07-24 ENCOUNTER — Encounter (HOSPITAL_COMMUNITY): Payer: Self-pay

## 2017-07-24 ENCOUNTER — Encounter (HOSPITAL_COMMUNITY)
Admission: RE | Admit: 2017-07-24 | Discharge: 2017-07-24 | Disposition: A | Discharge status: Home / Self Care | Payer: Medicaid Other | Source: Ambulatory Visit | Attending: Surgery | Admitting: Surgery

## 2017-07-24 ENCOUNTER — Encounter (INDEPENDENT_AMBULATORY_CARE_PROVIDER_SITE_OTHER): Payer: Self-pay

## 2017-07-24 DIAGNOSIS — I10 Essential (primary) hypertension: Secondary | ICD-10-CM | POA: Diagnosis not present

## 2017-07-24 DIAGNOSIS — R9431 Abnormal electrocardiogram [ECG] [EKG]: Secondary | ICD-10-CM | POA: Insufficient documentation

## 2017-07-24 DIAGNOSIS — K8010 Calculus of gallbladder with chronic cholecystitis without obstruction: Secondary | ICD-10-CM | POA: Diagnosis not present

## 2017-07-24 DIAGNOSIS — Z01818 Encounter for other preprocedural examination: Secondary | ICD-10-CM | POA: Insufficient documentation

## 2017-07-24 DIAGNOSIS — Z01812 Encounter for preprocedural laboratory examination: Secondary | ICD-10-CM | POA: Diagnosis not present

## 2017-07-24 LAB — CBC
HEMATOCRIT: 39.4 % (ref 36.0–46.0)
Hemoglobin: 12.9 g/dL (ref 12.0–15.0)
MCH: 26.4 pg (ref 26.0–34.0)
MCHC: 32.7 g/dL (ref 30.0–36.0)
MCV: 80.6 fL (ref 78.0–100.0)
PLATELETS: 293 10*3/uL (ref 150–400)
RBC: 4.89 MIL/uL (ref 3.87–5.11)
RDW: 15.7 % — AB (ref 11.5–15.5)
WBC: 8.2 10*3/uL (ref 4.0–10.5)

## 2017-07-24 LAB — BASIC METABOLIC PANEL
ANION GAP: 9 (ref 5–15)
BUN: 12 mg/dL (ref 6–20)
CALCIUM: 8.9 mg/dL (ref 8.9–10.3)
CO2: 26 mmol/L (ref 22–32)
Chloride: 105 mmol/L (ref 101–111)
Creatinine, Ser: 0.86 mg/dL (ref 0.44–1.00)
Glucose, Bld: 117 mg/dL — ABNORMAL HIGH (ref 65–99)
Potassium: 4 mmol/L (ref 3.5–5.1)
Sodium: 140 mmol/L (ref 135–145)

## 2017-07-24 LAB — HCG, SERUM, QUALITATIVE: PREG SERUM: NEGATIVE

## 2017-07-31 ENCOUNTER — Encounter (HOSPITAL_COMMUNITY): Payer: Self-pay | Admitting: Anesthesiology

## 2017-07-31 MED ORDER — DEXTROSE 5 % IV SOLN
3.0000 g | INTRAVENOUS | Status: AC
Start: 2017-08-01 — End: 2017-08-01
  Administered 2017-08-01: 3 g via INTRAVENOUS
  Filled 2017-07-31 (×2): qty 3000

## 2017-07-31 NOTE — Anesthesia Preprocedure Evaluation (Addendum)
Anesthesia Evaluation  Patient identified by MRN, date of birth, ID band Patient awake    Reviewed: Allergy & Precautions, NPO status , Patient's Chart, lab work & pertinent test results  Airway Mallampati: I  TM Distance: >3 FB Neck ROM: Full   Comment: Hypertrophied tonsils Dental no notable dental hx. (+) Teeth Intact, Caps   Pulmonary shortness of breath and with exertion, asthma , sleep apnea and Continuous Positive Airway Pressure Ventilation , COPD,  COPD inhaler,    Pulmonary exam normal breath sounds clear to auscultation       Cardiovascular hypertension, Pt. on medications  Rhythm:Regular Rate:Normal  EKG: NSR, non specific ST-T wave changes   Neuro/Psych PSYCHIATRIC DISORDERS Anxiety Depression  Neuromuscular disease    GI/Hepatic Neg liver ROS, Cholelithiasis with chronic cholecystitis   Endo/Other  Morbid obesityHyperlipidemia  Renal/GU negative Renal ROS  negative genitourinary   Musculoskeletal  (+) Arthritis , Osteoarthritis,    Abdominal (+) + obese,   Peds  Hematology   Anesthesia Other Findings   Reproductive/Obstetrics                            Anesthesia Physical Anesthesia Plan  ASA: III  Anesthesia Plan: General   Post-op Pain Management:    Induction: Intravenous, Rapid sequence and Cricoid pressure planned  PONV Risk Score and Plan: 4 or greater and Ondansetron, Dexamethasone, Midazolam, Scopolamine patch - Pre-op, Propofol infusion and Metaclopromide  Airway Management Planned: Oral ETT  Additional Equipment:   Intra-op Plan: Utilization of Controlled Hypotension per surrgeon request  Post-operative Plan: Extubation in OR  Informed Consent: I have reviewed the patients History and Physical, chart, labs and discussed the procedure including the risks, benefits and alternatives for the proposed anesthesia with the patient or authorized representative who  has indicated his/her understanding and acceptance.   Dental advisory given  Plan Discussed with: CRNA, Anesthesiologist and Surgeon  Anesthesia Plan Comments:        Anesthesia Quick Evaluation

## 2017-08-01 ENCOUNTER — Encounter (HOSPITAL_COMMUNITY): Payer: Self-pay

## 2017-08-01 ENCOUNTER — Ambulatory Visit (HOSPITAL_COMMUNITY): Payer: Medicaid Other | Admitting: Anesthesiology

## 2017-08-01 ENCOUNTER — Ambulatory Visit (HOSPITAL_COMMUNITY)
Admission: RE | Admit: 2017-08-01 | Discharge: 2017-08-01 | Disposition: A | Discharge status: Home / Self Care | Payer: Medicaid Other | Source: Ambulatory Visit | Attending: Surgery | Admitting: Surgery

## 2017-08-01 ENCOUNTER — Encounter (HOSPITAL_COMMUNITY)
Admission: RE | Disposition: A | Discharge status: Home / Self Care | Payer: Self-pay | Source: Ambulatory Visit | Attending: Surgery

## 2017-08-01 ENCOUNTER — Ambulatory Visit (HOSPITAL_COMMUNITY): Payer: Medicaid Other

## 2017-08-01 DIAGNOSIS — G8929 Other chronic pain: Secondary | ICD-10-CM | POA: Diagnosis not present

## 2017-08-01 DIAGNOSIS — E7800 Pure hypercholesterolemia, unspecified: Secondary | ICD-10-CM | POA: Insufficient documentation

## 2017-08-01 DIAGNOSIS — I10 Essential (primary) hypertension: Secondary | ICD-10-CM | POA: Diagnosis not present

## 2017-08-01 DIAGNOSIS — F419 Anxiety disorder, unspecified: Secondary | ICD-10-CM | POA: Diagnosis not present

## 2017-08-01 DIAGNOSIS — Z79899 Other long term (current) drug therapy: Secondary | ICD-10-CM | POA: Insufficient documentation

## 2017-08-01 DIAGNOSIS — K439 Ventral hernia without obstruction or gangrene: Secondary | ICD-10-CM | POA: Diagnosis not present

## 2017-08-01 DIAGNOSIS — F329 Major depressive disorder, single episode, unspecified: Secondary | ICD-10-CM | POA: Insufficient documentation

## 2017-08-01 DIAGNOSIS — Z79891 Long term (current) use of opiate analgesic: Secondary | ICD-10-CM | POA: Insufficient documentation

## 2017-08-01 DIAGNOSIS — J449 Chronic obstructive pulmonary disease, unspecified: Secondary | ICD-10-CM | POA: Diagnosis not present

## 2017-08-01 DIAGNOSIS — K5730 Diverticulosis of large intestine without perforation or abscess without bleeding: Secondary | ICD-10-CM | POA: Insufficient documentation

## 2017-08-01 DIAGNOSIS — K8010 Calculus of gallbladder with chronic cholecystitis without obstruction: Secondary | ICD-10-CM | POA: Insufficient documentation

## 2017-08-01 DIAGNOSIS — K8020 Calculus of gallbladder without cholecystitis without obstruction: Secondary | ICD-10-CM

## 2017-08-01 HISTORY — PX: CHOLECYSTECTOMY: SHX55

## 2017-08-01 SURGERY — LAPAROSCOPIC CHOLECYSTECTOMY WITH INTRAOPERATIVE CHOLANGIOGRAM
Anesthesia: General | Site: Abdomen

## 2017-08-01 MED ORDER — HYDROMORPHONE HCL-NACL 0.5-0.9 MG/ML-% IV SOSY
PREFILLED_SYRINGE | INTRAVENOUS | Status: AC
  Filled 2017-08-01: qty 2

## 2017-08-01 MED ORDER — FENTANYL CITRATE (PF) 100 MCG/2ML IJ SOLN
INTRAMUSCULAR | Status: DC | PRN
  Administered 2017-08-01: 100 ug via INTRAVENOUS
  Administered 2017-08-01 (×2): 50 ug via INTRAVENOUS

## 2017-08-01 MED ORDER — LABETALOL HCL 5 MG/ML IV SOLN
INTRAVENOUS | Status: AC
  Filled 2017-08-01: qty 4

## 2017-08-01 MED ORDER — ONDANSETRON HCL 4 MG/2ML IJ SOLN
INTRAMUSCULAR | Status: DC | PRN
  Administered 2017-08-01: 4 mg via INTRAVENOUS

## 2017-08-01 MED ORDER — MEPERIDINE HCL 50 MG/ML IJ SOLN: 6.2500 mg | INTRAMUSCULAR | Status: DC | PRN

## 2017-08-01 MED ORDER — SUGAMMADEX SODIUM 200 MG/2ML IV SOLN
INTRAVENOUS | Status: DC | PRN
  Administered 2017-08-01: 300 mg via INTRAVENOUS

## 2017-08-01 MED ORDER — SUGAMMADEX SODIUM 500 MG/5ML IV SOLN
INTRAVENOUS | Status: AC
  Filled 2017-08-01: qty 5

## 2017-08-01 MED ORDER — MIDAZOLAM HCL 2 MG/2ML IJ SOLN
INTRAMUSCULAR | Status: DC | PRN
  Administered 2017-08-01: 2 mg via INTRAVENOUS

## 2017-08-01 MED ORDER — ROCURONIUM BROMIDE 50 MG/5ML IV SOSY
PREFILLED_SYRINGE | INTRAVENOUS | Status: DC | PRN
  Administered 2017-08-01: 40 mg via INTRAVENOUS
  Administered 2017-08-01 (×2): 10 mg via INTRAVENOUS

## 2017-08-01 MED ORDER — SCOPOLAMINE 1 MG/3DAYS TD PT72
MEDICATED_PATCH | TRANSDERMAL | Status: AC
  Filled 2017-08-01: qty 1

## 2017-08-01 MED ORDER — DEXAMETHASONE SODIUM PHOSPHATE 10 MG/ML IJ SOLN
INTRAMUSCULAR | Status: AC
  Filled 2017-08-01: qty 1

## 2017-08-01 MED ORDER — IOPAMIDOL (ISOVUE-300) INJECTION 61%
INTRAVENOUS | Status: DC | PRN
  Administered 2017-08-01: 50 mL via INTRAVENOUS

## 2017-08-01 MED ORDER — SUCCINYLCHOLINE CHLORIDE 200 MG/10ML IV SOSY
PREFILLED_SYRINGE | INTRAVENOUS | Status: DC | PRN
  Administered 2017-08-01: 120 mg via INTRAVENOUS

## 2017-08-01 MED ORDER — SUGAMMADEX SODIUM 200 MG/2ML IV SOLN
INTRAVENOUS | Status: AC
  Filled 2017-08-01: qty 2

## 2017-08-01 MED ORDER — LACTATED RINGERS IV SOLN
INTRAVENOUS | Status: DC
  Administered 2017-08-01 (×2): via INTRAVENOUS

## 2017-08-01 MED ORDER — PHENYLEPHRINE 40 MCG/ML (10ML) SYRINGE FOR IV PUSH (FOR BLOOD PRESSURE SUPPORT)
PREFILLED_SYRINGE | INTRAVENOUS | Status: AC
  Filled 2017-08-01: qty 10

## 2017-08-01 MED ORDER — LIDOCAINE 2% (20 MG/ML) 5 ML SYRINGE
INTRAMUSCULAR | Status: AC
  Filled 2017-08-01: qty 5

## 2017-08-01 MED ORDER — BUPIVACAINE-EPINEPHRINE 0.25% -1:200000 IJ SOLN
INTRAMUSCULAR | Status: AC
  Filled 2017-08-01: qty 1

## 2017-08-01 MED ORDER — ONDANSETRON HCL 4 MG/2ML IJ SOLN
INTRAMUSCULAR | Status: AC
  Filled 2017-08-01: qty 2

## 2017-08-01 MED ORDER — OXYCODONE HCL 5 MG PO TABS: 5.0000 mg | ORAL_TABLET | Freq: Four times a day (QID) | ORAL | 0 refills | Status: DC | PRN

## 2017-08-01 MED ORDER — RINGERS IRRIGATION IR SOLN
Status: DC | PRN
  Administered 2017-08-01: 2000 mL

## 2017-08-01 MED ORDER — IOPAMIDOL (ISOVUE-300) INJECTION 61%
INTRAVENOUS | Status: AC
  Filled 2017-08-01: qty 50

## 2017-08-01 MED ORDER — PHENYLEPHRINE HCL 10 MG/ML IJ SOLN
INTRAMUSCULAR | Status: DC | PRN
  Administered 2017-08-01 (×5): 80 ug via INTRAVENOUS

## 2017-08-01 MED ORDER — SUCCINYLCHOLINE CHLORIDE 200 MG/10ML IV SOSY
PREFILLED_SYRINGE | INTRAVENOUS | Status: AC
  Filled 2017-08-01: qty 10

## 2017-08-01 MED ORDER — METOCLOPRAMIDE HCL 5 MG/ML IJ SOLN: 10.0000 mg | Freq: Once | INTRAMUSCULAR | Status: DC | PRN

## 2017-08-01 MED ORDER — SODIUM CHLORIDE 0.9 % IR SOLN
Status: DC | PRN
  Administered 2017-08-01: 1000 mL

## 2017-08-01 MED ORDER — CHLORHEXIDINE GLUCONATE CLOTH 2 % EX PADS: 6.0000 | MEDICATED_PAD | Freq: Once | CUTANEOUS | Status: DC

## 2017-08-01 MED ORDER — SCOPOLAMINE 1 MG/3DAYS TD PT72
1.0000 | MEDICATED_PATCH | TRANSDERMAL | Status: DC
  Administered 2017-08-01: 1 via TRANSDERMAL

## 2017-08-01 MED ORDER — KETOROLAC TROMETHAMINE 30 MG/ML IJ SOLN
30.0000 mg | Freq: Once | INTRAMUSCULAR | Status: AC
  Administered 2017-08-01: 30 mg via INTRAVENOUS
  Filled 2017-08-01: qty 1

## 2017-08-01 MED ORDER — LIDOCAINE 2% (20 MG/ML) 5 ML SYRINGE
INTRAMUSCULAR | Status: DC | PRN
  Administered 2017-08-01: 8 mg via INTRAVENOUS

## 2017-08-01 MED ORDER — FENTANYL CITRATE (PF) 100 MCG/2ML IJ SOLN
INTRAMUSCULAR | Status: AC
  Filled 2017-08-01: qty 2

## 2017-08-01 MED ORDER — HYDROCODONE-ACETAMINOPHEN 5-325 MG PO TABS: 1.0000 | ORAL_TABLET | Freq: Once | ORAL | Status: DC

## 2017-08-01 MED ORDER — ROCURONIUM BROMIDE 50 MG/5ML IV SOSY
PREFILLED_SYRINGE | INTRAVENOUS | Status: AC
  Filled 2017-08-01: qty 5

## 2017-08-01 MED ORDER — LABETALOL HCL 5 MG/ML IV SOLN
5.0000 mg | INTRAVENOUS | Status: DC | PRN
  Administered 2017-08-01 (×3): 5 mg via INTRAVENOUS

## 2017-08-01 MED ORDER — HYDROMORPHONE HCL-NACL 0.5-0.9 MG/ML-% IV SOSY
0.2500 mg | PREFILLED_SYRINGE | INTRAVENOUS | Status: DC | PRN
  Administered 2017-08-01 (×2): 0.5 mg via INTRAVENOUS

## 2017-08-01 MED ORDER — DEXAMETHASONE SODIUM PHOSPHATE 10 MG/ML IJ SOLN
INTRAMUSCULAR | Status: DC | PRN
  Administered 2017-08-01: 10 mg via INTRAVENOUS

## 2017-08-01 MED ORDER — BUPIVACAINE-EPINEPHRINE 0.25% -1:200000 IJ SOLN
INTRAMUSCULAR | Status: DC | PRN
  Administered 2017-08-01: 24 mL

## 2017-08-01 MED ORDER — PROPOFOL 10 MG/ML IV BOLUS
INTRAVENOUS | Status: DC | PRN
  Administered 2017-08-01: 200 mg via INTRAVENOUS

## 2017-08-01 MED ORDER — PROPOFOL 10 MG/ML IV BOLUS
INTRAVENOUS | Status: AC
  Filled 2017-08-01: qty 20

## 2017-08-01 MED ORDER — MIDAZOLAM HCL 2 MG/2ML IJ SOLN
INTRAMUSCULAR | Status: AC
  Filled 2017-08-01: qty 2

## 2017-08-01 SURGICAL SUPPLY — 47 items
APL SKNCLS STERI-STRIP NONHPOA (GAUZE/BANDAGES/DRESSINGS) ×1
APPLIER CLIP ROT 10 11.4 M/L (STAPLE) ×3
APR CLP MED LRG 11.4X10 (STAPLE) ×1
BAG SPEC RTRVL LRG 6X4 10 (ENDOMECHANICALS) ×1
BENZOIN TINCTURE PRP APPL 2/3 (GAUZE/BANDAGES/DRESSINGS) ×3 IMPLANT
CHLORAPREP W/TINT 26ML (MISCELLANEOUS) ×3 IMPLANT
CLIP APPLIE ROT 10 11.4 M/L (STAPLE) ×1 IMPLANT
CLOSURE WOUND 1/2 X4 (GAUZE/BANDAGES/DRESSINGS) ×1
COVER MAYO STAND STRL (DRAPES) ×3 IMPLANT
COVER SURGICAL LIGHT HANDLE (MISCELLANEOUS) ×3 IMPLANT
DECANTER SPIKE VIAL GLASS SM (MISCELLANEOUS) ×3 IMPLANT
DRAPE C-ARM 42X120 X-RAY (DRAPES) ×3 IMPLANT
DRAPE UTILITY XL STRL (DRAPES) ×3 IMPLANT
DRSG TEGADERM 2-3/8X2-3/4 SM (GAUZE/BANDAGES/DRESSINGS) ×11 IMPLANT
DRSG TEGADERM 4X4.75 (GAUZE/BANDAGES/DRESSINGS) ×3 IMPLANT
ELECT REM PT RETURN 15FT ADLT (MISCELLANEOUS) ×3 IMPLANT
FILTER SMOKE EVAC LAPAROSHD (FILTER) ×3 IMPLANT
GAUZE SPONGE 2X2 8PLY STRL LF (GAUZE/BANDAGES/DRESSINGS) IMPLANT
GLOVE BIO SURGEON STRL SZ7 (GLOVE) ×3 IMPLANT
GLOVE BIOGEL PI IND STRL 7.5 (GLOVE) ×1 IMPLANT
GLOVE BIOGEL PI INDICATOR 7.5 (GLOVE) ×2
GOWN STRL REUS W/TWL LRG LVL3 (GOWN DISPOSABLE) ×3 IMPLANT
GOWN STRL REUS W/TWL XL LVL3 (GOWN DISPOSABLE) ×6 IMPLANT
HEMOSTAT SNOW SURGICEL 2X4 (HEMOSTASIS) ×2 IMPLANT
IRRIG SUCT STRYKERFLOW 2 WTIP (MISCELLANEOUS) ×3
IRRIGATION SUCT STRKRFLW 2 WTP (MISCELLANEOUS) ×1 IMPLANT
KIT BASIN OR (CUSTOM PROCEDURE TRAY) ×3 IMPLANT
NS IRRIG 1000ML POUR BTL (IV SOLUTION) ×3 IMPLANT
POSITIONER SURGICAL ARM (MISCELLANEOUS) IMPLANT
POUCH SPECIMEN RETRIEVAL 10MM (ENDOMECHANICALS) ×3 IMPLANT
RINGERS IRRIG 1000ML POUR BTL (IV SOLUTION) ×3 IMPLANT
SCISSORS LAP 5X35 DISP (ENDOMECHANICALS) ×3 IMPLANT
SET CHOLANGIOGRAPH MIX (MISCELLANEOUS) ×3 IMPLANT
SPONGE GAUZE 2X2 STER 10/PKG (GAUZE/BANDAGES/DRESSINGS) ×2
STRIP CLOSURE SKIN 1/2X4 (GAUZE/BANDAGES/DRESSINGS) ×2 IMPLANT
SUT MNCRL AB 4-0 PS2 18 (SUTURE) ×3 IMPLANT
SUT VICRYL 0 UR6 27IN ABS (SUTURE) ×2 IMPLANT
SYR 20CC LL (SYRINGE) IMPLANT
TAPE CLOTH 4X10 WHT NS (GAUZE/BANDAGES/DRESSINGS) IMPLANT
TOWEL OR 17X26 10 PK STRL BLUE (TOWEL DISPOSABLE) ×3 IMPLANT
TOWEL OR NON WOVEN STRL DISP B (DISPOSABLE) ×3 IMPLANT
TRAY LAPAROSCOPIC (CUSTOM PROCEDURE TRAY) ×3 IMPLANT
TROCAR BLADELESS OPT 5 100 (ENDOMECHANICALS) ×6 IMPLANT
TROCAR BLADELESS OPT 5 150 (ENDOMECHANICALS) ×2 IMPLANT
TROCAR XCEL BLUNT TIP 100MML (ENDOMECHANICALS) ×3 IMPLANT
TROCAR XCEL NON-BLD 11X100MML (ENDOMECHANICALS) ×3 IMPLANT
TUBING INSUF HEATED (TUBING) ×2 IMPLANT

## 2017-08-01 NOTE — Progress Notes (Signed)
Labetalol 5 mg IVP given for elevated blood pressure- diastolic blood pressure- 102

## 2017-08-01 NOTE — Progress Notes (Signed)
Annette Bryant, patient's daughter, explained to [facility] that she would be unable to stay with patient today since she was going to work at 1 pm today.  I called Dr Corliss Skains and he states that he explained to the family in the office that she would need family to stay with her for 24 hours postop.  He states he is unwilling to place admission orders.  I called and spoke with the daughter, Annette Bryant.  Annette Bryant stated that she would have her fiance stay with her until she returned from work tonight and that she would not be along postoperatively.  Annette Bryant, daughter, [phone]

## 2017-08-01 NOTE — Discharge Instructions (Signed)
General Anesthesia, Adult, Care After These instructions provide you with information about caring for yourself after your procedure. Your health care provider may also give you more specific instructions. Your treatment has been planned according to current medical practices, but problems sometimes occur. Call your health care provider if you have any problems or questions after your procedure. What can I expect after the procedure? After the procedure, it is common to have:  Vomiting.  A sore throat.  Mental slowness.  It is common to feel:  Nauseous.  Cold or shivery.  Sleepy.  Tired.  Sore or achy, even in parts of your body where you did not have surgery.  Follow these instructions at home: For at least 24 hours after the procedure:  Do not: ? Participate in activities where you could fall or become injured. ? Drive. ? Use heavy machinery. ? Drink alcohol. ? Take sleeping pills or medicines that cause drowsiness. ? Make important decisions or sign legal documents. ? Take care of children on your own.  Rest. Eating and drinking  If you vomit, drink water, juice, or soup when you can drink without vomiting.  Drink enough fluid to keep your urine clear or pale yellow.  Make sure you have little or no nausea before eating solid foods.  Follow the diet recommended by your health care provider. General instructions  Have a responsible adult stay with you until you are awake and alert.  Return to your normal activities as told by your health care provider. Ask your health care provider what activities are safe for you.  Take over-the-counter and prescription medicines only as told by your health care provider.  If you smoke, do not smoke without supervision.  Keep all follow-up visits as told by your health care provider. This is important. Contact a health care provider if:  You continue to have nausea or vomiting at home, and medicines are not helpful.  You  cannot drink fluids or start eating again.  You cannot urinate after 8-12 hours.  You develop a skin rash.  You have fever.  You have increasing redness at the site of your procedure. Get help right away if:  You have difficulty breathing.  You have chest pain.  You have unexpected bleeding.  You feel that you are having a life-threatening or urgent problem. This information is not intended to replace advice given to you by your health care provider. Make sure you discuss any questions you have with your health care provider. Document Released: 03/04/2001 Document Revised: 04/30/2016 Document Reviewed: 11/10/2015 Elsevier Interactive Patient Education  2018 [place] ______[employer] LAPAROSCOPIC SURGERY: POST OP INSTRUCTIONS Always review your discharge instruction sheet given to you by the facility where your surgery was performed. IF YOU HAVE DISABILITY OR FAMILY LEAVE FORMS, YOU MUST BRING THEM TO THE OFFICE FOR PROCESSING.   DO NOT GIVE THEM TO YOUR DOCTOR.  1. A prescription for pain medication may be given to you upon discharge.  Take your pain medication as prescribed, if needed.  If narcotic pain medicine is not needed, then you may take acetaminophen (Tylenol) or ibuprofen (Advil) as needed. 2. Take your usually prescribed medications unless otherwise directed. 3. If you need a refill on your pain medication, please contact your pharmacy.  They will contact our office to request authorization. Prescriptions will not be filled after 5pm or on week-ends. 4. You should follow a light diet the first few days after arrival home, such as soup  and crackers, etc.  Be sure to include lots of fluids daily. 5. Most patients will experience some swelling and bruising in the area of the incisions.  Ice packs will help.  Swelling and bruising can take several days to resolve.  6. It is common to experience some constipation if taking pain medication after  surgery.  Increasing fluid intake and taking a stool softener (such as Colace) will usually help or prevent this problem from occurring.  A mild laxative (Milk of Magnesia or Miralax) should be taken according to package instructions if there are no bowel movements after 48 hours. 7. Unless discharge instructions indicate otherwise, you may remove your bandages 24-48 hours after surgery, and you may shower at that time.  You may have steri-strips (small skin tapes) in place directly over the incision.  These strips should be left on the skin for 7-10 days.  If your surgeon used skin glue on the incision, you may shower in 24 hours.  The glue will flake off over the next 2-3 weeks.  Any sutures or staples will be removed at the office during your follow-up visit. 8. ACTIVITIES:  You may resume regular (light) daily activities beginning the next day--such as daily self-care, walking, climbing stairs--gradually increasing activities as tolerated.  You may have sexual intercourse when it is comfortable.  Refrain from any heavy lifting or straining until approved by your doctor. a. You may drive when you are no longer taking prescription pain medication, you can comfortably wear a seatbelt, and you can safely maneuver your car and apply brakes. b. RETURN TO WORK:  __________________________________________________________ 9. You should see your doctor in the office for a follow-up appointment approximately 2-3 weeks after your surgery.  Make sure that you call for this appointment within a day or two after you arrive home to insure a convenient appointment time. 10. OTHER INSTRUCTIONS: __________________________________________________________________________________________________________________________ __________________________________________________________________________________________________________________________ WHEN TO CALL YOUR DOCTOR: 1. Fever over 101.0 2. Inability to urinate 3. Continued  bleeding from incision. 4. Increased pain, redness, or drainage from the incision. 5. Increasing abdominal pain  The clinic staff is available to answer your questions during regular business hours.  Please dont hesitate to call and ask to speak to one of the nurses for clinical concerns.  If you have a medical emergency, go to the nearest emergency room or call 911.  A surgeon from [facility] is always on call at the hospital. [address], [address], [place], [place]  [address] ? [address] [address], [place], [place]   [address] [phone] ? [phone] ? FAX [phone] Web site: [email or web address]

## 2017-08-01 NOTE — Progress Notes (Signed)
Spoke with Dr. Corliss Skains, patient states her pain is a 10/10 when wakes up but falls right back to sleep and snoring. Ask Dr. Corliss Skains is I could give a nonnarcotic pain medication. Per MD will give patient 30 of toradol to manage her pain. Will continue to monitor.

## 2017-08-01 NOTE — Anesthesia Postprocedure Evaluation (Signed)
Anesthesia Post Note  Patient: Annette Bryant  Procedure(s) Performed: Procedure(s) (LRB): LAPAROSCOPIC CHOLECYSTECTOMY (N/A)     Patient location during evaluation: PACU Anesthesia Type: General Level of consciousness: awake and alert and oriented Pain management: pain level controlled Vital Signs Assessment: post-procedure vital signs reviewed and stable Respiratory status: spontaneous breathing, nonlabored ventilation, respiratory function stable and patient connected to nasal cannula oxygen Cardiovascular status: blood pressure returned to baseline and stable Postop Assessment: no signs of nausea or vomiting Anesthetic complications: no    Last Vitals:  Vitals:   08/01/17 1100 08/01/17 1105  BP: (!) 132/98 (!) 139/102  Pulse: 72 (!) 59  Resp: 15 15  Temp:    SpO2: 100% 98%    Last Pain:  Vitals:   08/01/17 1100  TempSrc:   PainSc: 6                  Coreon Simkins A.

## 2017-08-01 NOTE — Anesthesia Procedure Notes (Addendum)
Procedure Name: Intubation Date/Time: 08/01/2017 7:43 AM Performed by: Dione Booze Pre-anesthesia Checklist: Suction available, Patient being monitored, Emergency Drugs available and Patient identified Patient Re-evaluated:Patient Re-evaluated prior to induction Oxygen Delivery Method: Circle system utilized Preoxygenation: Pre-oxygenation with 100% oxygen Induction Type: IV induction, Rapid sequence and Cricoid Pressure applied Ventilation: Two handed mask ventilation required and Mask ventilation with difficulty Laryngoscope Size: Mac, 4 and Glidescope Grade View: Grade III Tube type: Oral Tube size: 7.5 mm Number of attempts: 2 Airway Equipment and Method: Stylet and Video-laryngoscopy Placement Confirmation: ETT inserted through vocal cords under direct vision,  positive ETCO2 and breath sounds checked- equal and bilateral Secured at: 22 cm Tube secured with: Tape Dental Injury: Teeth and Oropharynx as per pre-operative assessment and Injury to tongue  Comments: DL x 1 with MAC 4, unable to see cords. Glidescope 4 successful. Cords clear. Small pinched area lt tongue.

## 2017-08-01 NOTE — H&P (View-Only) (Signed)
History of Present Illness Annette Bryant. Annette Schlee MD; 07/09/2017 5:50 PM) The patient is a 56 year old female who presents for evaluation of gall stones. PCP - Dr. Sudie Bailey Reason for consultation: Ventral hernia  This is a 56 year old female with multiple medical problems who presents with about 5 months of abdominal bloating and distention. This has been worse recently. She has had occasional periumbilical abdominal pain. She denies any diarrhea. She has occasional constipation. She has had 2 recent visits to the emergency department. She has also has some chronic low back pain which seems to be worse recently. She underwent a CT scan of the abdomen and pelvis which revealed a large lower abdominal ventral hernia as well as multiple large gallstones within the gallbladder lumen but no evidence of acute cholecystitis. Alkaline phosphatase was mildly elevated at 136 but other liver function tests were normal. CBC was also normal. She presents now primarily to discuss her ventral hernia. She has not noticed any bulging in the lower abdomen. She just has noticed generalized bloating of her entire abdomen.  CLINICAL DATA: Initial evaluation for acute abdominal pain, distension.  EXAM: CT ABDOMEN AND PELVIS WITH CONTRAST  TECHNIQUE: Multidetector CT imaging of the abdomen and pelvis was performed using the standard protocol following bolus administration of intravenous contrast.  CONTRAST: ISOVUE-300 IOPAMIDOL (ISOVUE-300) INJECTION 61%  COMPARISON: Prior chest CT from 06/20/2015.  FINDINGS: Lower chest: Tiny 4 mm nodule positioned along the left hemidiaphragm is stable from prior CT. Visualized lung bases are otherwise clear.  Hepatobiliary: Liver demonstrates a normal contrast enhanced appearance. Multiple stones present within the gallbladder lumen, largest of which measures 2 cm. No CT evidence for acute cholecystitis. No biliary dilatation.  Pancreas: Pancreas within  normal limits.  Spleen: Spleen within normal limits. The  Adrenals/Urinary Tract: Adrenal glands are normal. Kidneys equal in size with symmetric enhancement. 14 mm cyst noted within the upper pole of the right kidney. No nephrolithiasis, hydronephrosis, or focal enhancing renal mass. No hydroureter. Partially distended bladder within normal limits.  Stomach/Bowel: Stomach within normal limits. No evidence for bowel obstruction. Appendix normal. Ventral/ paraumbilical hernia containing fat and loops of small bowel present. No associated obstruction or inflammation. Colonic diverticulosis without evidence for acute diverticulitis. No acute inflammatory changes seen about the bowels.  Vascular/Lymphatic: Normal intravascular enhancement seen throughout the intra-abdominal aorta and its branch vessels. No adenopathy.  Reproductive: Uterus and ovaries within normal limits. 1 cm cyst noted within the left ovary, of doubtful significance.  Other: No free air or fluid.  Musculoskeletal: No acute osseous abnormality. No worrisome lytic or blastic osseous lesions. Multilevel facet arthrosis noted within the lower lumbar spine. Degenerative osteoarthritic changes noted about the hips bilaterally.  IMPRESSION: 1. No CT evidence for acute intra-abdominal or pelvic process. 2. Ventral/paraumbilical hernia containing fat and loops of small bowel without associated obstruction or inflammation. 3. Colonic diverticulosis without evidence for acute diverticulitis. 4. Cholelithiasis. 5. 4 mm pleural-based nodule at the left lung base, stable relative to prior chest CT from 06/20/2015. Given stability of this finding, this is most likely benign in nature.   Electronically Signed By: Rise Mu M.D. On: 04/24/2017 13:28    Past Surgical History (Annette Bryant, CMA; 07/09/2017 1:37 PM) Cesarean Section - 1  Diagnostic Studies History (Annette Bryant, CMA; 07/09/2017 1:37  PM) Colonoscopy never Mammogram 1-3 years ago  Allergies (Annette Bryant, CMA; 07/09/2017 1:38 PM) No Known Drug Allergies 07/09/2017  Medication History (Annette Bryant, CMA; 07/09/2017 1:44 PM) Albuterol (90MCG/ACT Aerosol  Soln, Inhalation) Active. Budesonide-Formoterol Fumarate (160-4.5MCG/ACT Aerosol, Inhalation) Active. Furosemide (40MG  Tablet, Oral) Active. Potassium (75MG  Tablet, Oral) Active. Lyrica (25MG  Capsule, Oral) Active. Xanax (1MG  Tablet, Oral) Active. DULoxetine HCl (60MG  Capsule DR Part, Oral) Active. Zolpidem Tartrate (10MG  Tablet, Oral) Active. Atorvastatin Calcium (10MG  Tablet, Oral) Active. Hydrocodone-Acetaminophen (5-325MG  Tablet, Oral) Active. Naproxen DR (500MG  Tablet DR, Oral) Active. ProAir HFA (108 (90 Base)MCG/ACT Aerosol Soln, Inhalation) Active. Symbicort (160-4.5MCG/ACT Aerosol, Inhalation) Active.  Social History (Annette Bryant, CMA; 07/09/2017 1:37 PM) Caffeine use Carbonated beverages, Coffee, Tea. No alcohol use No drug use Tobacco use Never smoker.  Family History (Annette Bryant, CMA; 07/09/2017 1:37 PM) Alcohol Abuse Father. Arthritis Mother. Cerebrovascular Accident Mother. Depression Mother. Diabetes Mellitus Mother. Hypertension Father.  Pregnancy / Birth History (Annette Bryant, CMA; 07/09/2017 1:37 PM) Age at menarche 14 years. Gravida 4 Maternal age 54-25 Para 4 Regular periods  Other Problems (Annette Bryant, CMA; 07/09/2017 1:37 PM) Anxiety Disorder Arthritis Asthma Back Pain Chronic Obstructive Lung Disease Depression Hypercholesterolemia Sleep Apnea Umbilical Hernia Repair     Review of Systems (Annette Bryant CMA; 07/09/2017 1:37 PM) General Present- Fatigue and Weight Gain. Not Present- Appetite Loss, Chills, Fever, Night Sweats and Weight Loss. Skin Not Present- Change in Wart/Mole, Dryness, Hives, Jaundice, New Lesions, Non-Healing Wounds, Rash and Ulcer. HEENT Present-  Wears glasses/contact lenses. Not Present- Earache, Hearing Loss, Hoarseness, Nose Bleed, Oral Ulcers, Ringing in the Ears, Seasonal Allergies, Sinus Pain, Sore Throat, Visual Disturbances and Yellow Eyes. Respiratory Not Present- Bloody sputum, Chronic Cough, Difficulty Breathing, Snoring and Wheezing. Breast Not Present- Breast Mass, Breast Pain, Nipple Discharge and Skin Changes. Cardiovascular Present- Palpitations, Shortness of Breath and Swelling of Extremities. Not Present- Chest Pain, Difficulty Breathing Lying Down, Leg Cramps and Rapid Heart Rate. Gastrointestinal Present- Constipation and Gets full quickly at meals. Not Present- Abdominal Pain, Bloating, Bloody Stool, Change in Bowel Habits, Chronic diarrhea, Difficulty Swallowing, Excessive gas, Hemorrhoids, Indigestion, Nausea, Rectal Pain and Vomiting. Female Genitourinary Not Present- Frequency, Nocturia, Painful Urination, Pelvic Pain and Urgency. Musculoskeletal Present- Back Pain, Joint Stiffness and Swelling of Extremities. Not Present- Joint Pain, Muscle Pain and Muscle Weakness. Neurological Not Present- Decreased Memory, Fainting, Headaches, Numbness, Seizures, Tingling, Tremor, Trouble walking and Weakness. Psychiatric Present- Anxiety and Depression. Not Present- Bipolar, Change in Sleep Pattern, Fearful and Frequent crying. Endocrine Not Present- Cold Intolerance, Excessive Hunger, Hair Changes, Heat Intolerance, Hot flashes and New Diabetes. Hematology Not Present- Blood Thinners, Easy Bruising, Excessive bleeding, Gland problems, HIV and Persistent Infections.  Vitals (Annette Bryant CMA; 07/09/2017 1:45 PM) 07/09/2017 1:44 PM Weight: 274 lb Height: 67in Body Surface Area: 2.31 m Body Mass Index: 42.91 kg/m  Temp.: 4F  Pulse: 78 (Regular)  BP: 124/82 (Sitting, Left Arm, Standard)      Physical Exam Molli Hazard(Shavana Calder K. Chares Slaymaker MD; 07/09/2017 5:51 PM)  The physical exam findings are as follows: Note:WDWN in  NAD Eyes: Pupils equal, round; sclera anicteric HENT: Oral mucosa moist; good dentition Neck: No masses palpated, no thyromegaly Lungs: CTA bilaterally; normal respiratory effort CV: Regular rate and rhythm; no murmurs; extremities well-perfused with no edema Abd: +bowel sounds, obese, distended, mildly tender periumbilical, no palpable organomegaly; possible lower infraumbilical ventral palpated with deep pressure but not very obvious due to obesity Skin: Warm, dry; no sign of jaundice Psychiatric - alert and oriented x 4; calm mood and affect    Assessment & Plan Molli Hazard(Zacari Stiff K. Caitlyn Buchanan MD; 07/09/2017 5:52 PM)  CALCULUS OF GALLBLADDER WITH CHRONIC CHOLECYSTITIS (K80.10)  Current Plans Schedule for Surgery -  Laparoscopic cholecystectomy with intraoperative cholangiogram. The surgical procedure has been discussed with the patient. Potential risks, benefits, alternative treatments, and expected outcomes have been explained. All of the patient's questions at this time have been answered. The likelihood of reaching the patient's treatment goal is good. The patient understand the proposed surgical procedure and wishes to proceed.  Would recommend proceeding with cholecystectomy first. If we repair the hernia first it will be very difficult to remove the gallbladder later with a large sheet of mesh covering her abdominal wall. Also, her risk of gallbladder cancer is moderately elevated because of the large size of the gallstones. Would recommend removing gallbladder first and thenwe will address the ventral hernia surgery at a later date.  VENTRAL HERNIA WITHOUT OBSTRUCTION OR GANGRENE (K43.9)  Elizar Alpern K. Jadine Brumley, MD, FACS [facility]  General/ Trauma Surgery  07/09/2017 5:53 PM   

## 2017-08-01 NOTE — Transfer of Care (Signed)
Immediate Anesthesia Transfer of Care Note  Patient: Annette Bryant  Procedure(s) Performed: Procedure(s): LAPAROSCOPIC CHOLECYSTECTOMY (N/A)  Patient Location: PACU  Anesthesia Type:General  Level of Consciousness: awake and patient cooperative  Airway & Oxygen Therapy: Patient Spontanous Breathing and Patient connected to face mask oxygen  Post-op Assessment: Report given to RN and Post -op Vital signs reviewed and stable  Post vital signs: Reviewed and stable  Last Vitals:  Vitals:   08/01/17 0510  BP: (!) 153/98  Pulse: 86  Resp: 18  Temp: 36.8 C  SpO2: 99%    Last Pain:  Vitals:   08/01/17 0510  TempSrc: Oral      Patients Stated Pain Goal: 4 (08/01/17 2248)  Complications: No apparent anesthesia complications

## 2017-08-01 NOTE — Op Note (Signed)
Laparoscopic Cholecystectomy Procedure Note  Indications: This patient presents with symptomatic gallbladder disease and will undergo laparoscopic cholecystectomy.  CT scan also shows an infraumbilical ventral hernia.  Pre-operative Diagnosis: Calculus of gallbladder with other cholecystitis, without mention of obstruction  Post-operative Diagnosis: Same  Surgeon: Malini Flemings K.   Assistants: Almond Lint, MD  Anesthesia: General endotracheal anesthesia  ASA Class: 2  Procedure Details  The patient was seen again in the Holding Room. The risks, benefits, complications, treatment options, and expected outcomes were discussed with the patient. The possibilities of reaction to medication, pulmonary aspiration, perforation of viscus, bleeding, recurrent infection, finding a normal gallbladder, the need for additional procedures, failure to diagnose a condition, the possible need to convert to an open procedure, and creating a complication requiring transfusion or operation were discussed with the patient. The likelihood of improving the patient's symptoms with return to their baseline status is good.  The patient and/or family concurred with the proposed plan, giving informed consent. The site of surgery properly noted. The patient was taken to Operating Room, identified as Annette Bryant and the procedure verified as Laparoscopic Cholecystectomy with Intraoperative Cholangiogram. A Time Out was held and the above information confirmed.  Prior to the induction of general anesthesia, antibiotic prophylaxis was administered. General endotracheal anesthesia was then administered and tolerated well. After the induction, the abdomen was prepped with Chloraprep and draped in sterile fashion. The patient was positioned in the supine position.  Local anesthetic agent was injected into the skin below the right costal margin.  I used a 5 mm Optiview trocar to cannulate the peritoneal cavity in the LUQ  below the costal margin.  The laparoscope was inserted and we examined the abdominal wall. The lower midline abdominal wall does not show a hernia, but instead eventrates out, like a diastasis.  I made an incision above the umbilicus. We dissected down to the abdominal fascia with blunt dissection.  The fascia was incised vertically and we entered the peritoneal cavity bluntly.  A pursestring suture of 0-Vicryl was placed around the fascial opening.  The Hasson cannula was inserted and secured with the stay suture.  Pneumoperitoneum was then created with CO2 and tolerated well without any adverse changes in the patient's vital signs. An 11-mm port was placed in the subxiphoid position.  Two 5-mm ports were placed in the right upper quadrant. All skin incisions were infiltrated with a local anesthetic agent before making the incision and placing the trocars.   We positioned the patient in reverse Trendelenburg, tilted slightly to the patient's left.  The gallbladder was identified, the fundus grasped and retracted cephalad. Duodenum and stomach are adherent to the gallbladder fundus.  Adhesions were lysed bluntly and with the electrocautery where indicated, taking care not to injure any adjacent organs or viscus. The infundibulum was grasped and retracted laterally, exposing the peritoneum overlying the triangle of Calot. This was then divided and exposed in a blunt fashion. The cystic duct was clearly identified and bluntly dissected circumferentially. A critical view of the cystic duct and cystic artery was obtained.  The cystic duct was then ligated with clips and divided. The cystic artery was, dissected free, ligated with clips and divided as well.   The gallbladder was dissected from the liver bed in retrograde fashion with the electrocautery. The gallbladder was removed and placed in an Endocatch sac. The liver bed was irrigated and inspected. Hemostasis was achieved with the electrocautery. Copious  irrigation was utilized and was repeatedly aspirated until  clear.  The gallbladder and Endocatch sac were then removed through the umbilical port site.  The stones are quite large and we had to enlarge the fascial opening.  The sac broke and some stones were spilled.  We spent some time picking up all of the spilled stones.  No stones remained after careful inspection.  The pursestring suture and an additional 0 Vicryl was used to close the umbilical fascia.    We again inspected the right upper quadrant for hemostasis.  Pneumoperitoneum was released as we removed the trocars.  4-0 Monocryl was used to close the skin.   Benzoin, steri-strips, and clean dressings were applied. The patient was then extubated and brought to the recovery room in stable condition. Instrument, sponge, and needle counts were correct at closure and at the conclusion of the case.   Findings: Cholecystitis with Cholelithiasis  Estimated Blood Loss: less than 50 mL         Drains: none         Specimens: Gallbladder           Complications: None; patient tolerated the procedure well.         Disposition: PACU - hemodynamically stable.         Condition: stable  Annette Bryant. Corliss Skains, MD, [facility]  General/ Trauma Surgery  08/01/2017 9:27 AM

## 2017-08-01 NOTE — Progress Notes (Signed)
Patients fiance is here to take the patient home. I asked if he would be with her for the next 24 hours, he stated " I didn't know I had to be with her all night." He says his fiance, the patients daughter gets off work at [employer] so she will relieve him once she gets off.

## 2017-08-01 NOTE — Progress Notes (Signed)
Labetalol 5 mg IVP given or elevated blood pressure.

## 2017-08-01 NOTE — Progress Notes (Signed)
Dr Malen Gauze is aware of Pt's BP during Pacu stay with systolic at or above 150, and diastolic at or above 100. States he is ok with this and to continue with discharge home. He has signed her out at this time.

## 2017-08-01 NOTE — Progress Notes (Signed)
Labetalol 5 mg IVP given for elevated blood pressure. 

## 2017-08-01 NOTE — Interval H&P Note (Signed)
History and Physical Interval Note:  08/01/2017 7:24 AM  Annette Bryant  has presented today for surgery, with the diagnosis of Chronic calculus cholecystitis  The various methods of treatment have been discussed with the patient and family. After consideration of risks, benefits and other options for treatment, the patient has consented to  Procedure(s): LAPAROSCOPIC CHOLECYSTECTOMY WITH INTRAOPERATIVE CHOLANGIOGRAM (N/A) as a surgical intervention .  The patient's history has been reviewed, patient examined, no change in status, stable for surgery.  I have reviewed the patient's chart and labs.  Questions were answered to the patient's satisfaction.     [name] K.

## 2017-08-02 ENCOUNTER — Encounter (HOSPITAL_COMMUNITY): Payer: Self-pay | Admitting: Surgery

## 2017-08-20 ENCOUNTER — Ambulatory Visit (HOSPITAL_COMMUNITY): Payer: Self-pay | Admitting: Psychiatry

## 2017-10-28 ENCOUNTER — Ambulatory Visit (HOSPITAL_COMMUNITY): Payer: Self-pay | Admitting: Psychiatry

## 2017-10-29 ENCOUNTER — Ambulatory Visit (INDEPENDENT_AMBULATORY_CARE_PROVIDER_SITE_OTHER): Payer: Medicare HMO | Admitting: Psychiatry

## 2017-10-29 ENCOUNTER — Encounter (HOSPITAL_COMMUNITY): Payer: Self-pay | Admitting: Psychiatry

## 2017-10-29 VITALS — BP 142/84 | HR 78 | Ht 67.5 in | Wt 283.0 lb

## 2017-10-29 DIAGNOSIS — F332 Major depressive disorder, recurrent severe without psychotic features: Secondary | ICD-10-CM

## 2017-10-29 DIAGNOSIS — F419 Anxiety disorder, unspecified: Secondary | ICD-10-CM | POA: Diagnosis not present

## 2017-10-29 DIAGNOSIS — R450 Nervousness: Secondary | ICD-10-CM | POA: Diagnosis not present

## 2017-10-29 DIAGNOSIS — Z811 Family history of alcohol abuse and dependence: Secondary | ICD-10-CM

## 2017-10-29 DIAGNOSIS — M549 Dorsalgia, unspecified: Secondary | ICD-10-CM

## 2017-10-29 DIAGNOSIS — Z818 Family history of other mental and behavioral disorders: Secondary | ICD-10-CM

## 2017-10-29 MED ORDER — ZOLPIDEM TARTRATE 10 MG PO TABS: ORAL_TABLET | ORAL | 2 refills | Status: DC

## 2017-10-29 MED ORDER — ALPRAZOLAM 1 MG PO TABS: 1.0000 mg | ORAL_TABLET | Freq: Two times a day (BID) | ORAL | 2 refills | Status: DC

## 2017-10-29 MED ORDER — DULOXETINE HCL 60 MG PO CPEP: 60.0000 mg | ORAL_CAPSULE | Freq: Two times a day (BID) | ORAL | 2 refills | Status: DC
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
## 2018-01-06 ENCOUNTER — Other Ambulatory Visit (HOSPITAL_COMMUNITY): Payer: Self-pay | Admitting: Psychiatry

## 2018-01-06 MED ORDER — DULOXETINE HCL 60 MG PO CPEP: 60.0000 mg | ORAL_CAPSULE | Freq: Two times a day (BID) | ORAL | 2 refills | Status: DC

## 2018-04-03 NOTE — Addendum Note (Signed)
Addended by: Alison StallingFOSTER, DELECIA M on: 04/03/2018 11:30 AM   Modules accepted: Orders

## 2018-04-09 ENCOUNTER — Other Ambulatory Visit (HOSPITAL_COMMUNITY): Payer: Self-pay | Admitting: Psychiatry

## 2018-04-10 ENCOUNTER — Encounter: Payer: Self-pay | Admitting: Internal Medicine

## 2018-07-10 ENCOUNTER — Telehealth: Payer: Self-pay

## 2018-07-10 ENCOUNTER — Other Ambulatory Visit: Payer: Self-pay

## 2018-07-10 ENCOUNTER — Ambulatory Visit (INDEPENDENT_AMBULATORY_CARE_PROVIDER_SITE_OTHER): Payer: Medicare HMO | Admitting: Nurse Practitioner

## 2018-07-10 ENCOUNTER — Encounter: Payer: Self-pay | Admitting: Nurse Practitioner

## 2018-07-10 DIAGNOSIS — R69 Illness, unspecified: Secondary | ICD-10-CM | POA: Diagnosis not present

## 2018-07-10 DIAGNOSIS — Z1211 Encounter for screening for malignant neoplasm of colon: Secondary | ICD-10-CM

## 2018-07-10 MED ORDER — PEG 3350-KCL-NA BICARB-NACL 420 G PO SOLR: 4000.0000 mL | ORAL | 0 refills | Status: DC

## 2018-07-10 NOTE — Progress Notes (Signed)
Primary Care Physician:  Gareth MorganKnowlton, Steve, MD Primary Gastroenterologist:  Dr. Jena Gaussourk  Chief Complaint  Patient presents with  . Colonoscopy    never had tcs    HPI:   Annette KearnsSandra A Bryant is a 57 y.o. female who presents referral from primary care to schedule first ever colonoscopy.  Nurse/phone triage was deferred to office visit due to polypharmacy and likely need for augmented sedation.  Reviewed information provided with the referral including office visit dated 04/09/2018.  At that time as noted the patient was overdue for first ever colonoscopy.  No history of colonoscopy in our system  Today she states she's doing well overall. Has never had a colonoscopy before. Denies abdominal pain, N/V, hematochezia, melena, fever, chills, unintentional weight loss, acute changes in bowel habits. Denies chest pain, dyspnea, dizziness, lightheadedness, syncope, near syncope. Denies any other upper or lower GI symptoms.   Xanax sometimes once a day. Hydrocodone as needed (lower back pain). Also Ambien at night.  Past Medical History:  Diagnosis Date  . Arthritis   . Asthma   . Carpal tunnel syndrome   . COPD (chronic obstructive pulmonary disease) (HCC)   . Depression   . Dyspnea    a. Adm 12/2013 for dyspnea/palps: normal stress-pics-only Lexiscan nuc, echo unremarkable with normal EF, d-dimer normal, ruled out for MI.  Marland Kitchen. Elevated lipids   . Hyperglycemia   . Hypertension   . Morbid obesity (HCC)   . Panic attacks   . Sleep apnea    by clinical history    Past Surgical History:  Procedure Laterality Date  . CESAREAN SECTION    . CHOLECYSTECTOMY N/A 08/01/2017   Procedure: LAPAROSCOPIC CHOLECYSTECTOMY;  Surgeon: Manus Ruddsuei, Matthew, MD;  Location: [facility];  Service: General;  Laterality: N/A;  . TUBAL LIGATION      Current Outpatient Medications  Medication Sig Dispense Refill  . albuterol (PROVENTIL) (2.5 MG/3ML) 0.083% nebulizer solution Take 2.5 mg by nebulization every 6 (six) hours  as needed for wheezing or shortness of breath.     . ALPRAZolam (XANAX) 1 MG tablet Take 1 tablet (1 mg total) by mouth 2 (two) times daily. (Patient taking differently: Take 1 mg by mouth 2 (two) times daily. Sometimes takes once a day) 60 tablet 2  . atorvastatin (LIPITOR) 40 MG tablet Take 40 mg by mouth at bedtime.   3  . budesonide-formoterol (SYMBICORT) 160-4.5 MCG/ACT inhaler Inhale 2 puffs into the lungs 2 (two) times daily.    . DULoxetine (CYMBALTA) 60 MG capsule TAKE 1 CAPSULE (60 MG TOTAL) BY MOUTH 2 (TWO) TIMES DAILY. 180 capsule 3  . furosemide (LASIX) 40 MG tablet Take 40 mg by mouth daily as needed for fluid or edema.     Marland Kitchen. HYDROcodone-acetaminophen (NORCO) 7.5-325 MG tablet TAKE HALF A TABLET TO 1 TABLET BY MOUTH UP TO 3 TIMES A DAY FOR SEVERE PAIN.  0  . KLOR-CON M20 20 MEQ tablet Take 20 mEq by mouth daily.  11  . Multiple Vitamin (MULTIVITAMIN WITH MINERALS) TABS tablet Take 1 tablet by mouth daily.    . naproxen (NAPROSYN) 500 MG tablet Take 1 tablet (500 mg total) by mouth 2 (two) times daily. (Patient taking differently: Take 500 mg by mouth 3 (three) times daily as needed for mild pain. ) 30 tablet 0  . PROAIR HFA 108 (90 BASE) MCG/ACT inhaler Inhale 1-2 puffs into the lungs every 6 (six) hours as needed for wheezing or shortness of breath.     .Marland Kitchen  zolpidem (AMBIEN) 10 MG tablet TAKE 1 TABLET BY MOUTH AT BEDTIME FOR SLEEP. (Patient taking differently: at bedtime as needed. TAKE 1 TABLET BY MOUTH AT BEDTIME FOR SLEEP.) 30 tablet 2  . potassium gluconate 595 (99 K) MG TABS tablet Take 595 mg by mouth daily as needed (muscle spasms).     No current facility-administered medications for this visit.     Allergies as of 07/10/2018  . (No Known Allergies)    Family History  Problem Relation Age of Onset  . Coronary artery disease Sister 40       stents  . Depression Sister   . Coronary artery disease Sister   . Heart attack Sister 47       CHF, MI  . Schizophrenia Brother    . Alcohol abuse Brother   . Depression Mother   . Alcohol abuse Father   . Colon cancer Neg Hx     Social History   Socioeconomic History  . Marital status: Legally Separated    Spouse name: Not on file  . Number of children: Not on file  . Years of education: Not on file  . Highest education level: Not on file  Occupational History  . Not on file  Social Needs  . Financial resource strain: Not on file  . Food insecurity:    Worry: Not on file    Inability: Not on file  . Transportation needs:    Medical: Not on file    Non-medical: Not on file  Tobacco Use  . Smoking status: Never Smoker  . Smokeless tobacco: Never Used  Substance and Sexual Activity  . Alcohol use: No  . Drug use: No  . Sexual activity: Not Currently    Birth control/protection: Surgical  Lifestyle  . Physical activity:    Days per week: Not on file    Minutes per session: Not on file  . Stress: Not on file  Relationships  . Social connections:    Talks on phone: Not on file    Gets together: Not on file    Attends religious service: Not on file    Active member of club or organization: Not on file    Attends meetings of clubs or organizations: Not on file    Relationship status: Not on file  . Intimate partner violence:    Fear of current or ex partner: Not on file    Emotionally abused: Not on file    Physically abused: Not on file    Forced sexual activity: Not on file  Other Topics Concern  . Not on file  Social History Narrative  . Not on file    Review of Systems: General: Negative for anorexia, weight loss, fever, chills, fatigue, weakness. ENT: Negative for hoarseness, difficulty swallowing , nasal congestion. CV: Negative for chest pain, angina, palpitations, dyspnea on exertion, peripheral edema.  Respiratory: Negative for dyspnea at rest, dyspnea on exertion, cough, sputum, wheezing.  GI: See history of present illness. MS: Admits chronic back pain.  Derm: Negative for  rash or itching.  Endo: Negative for unusual weight change.  Heme: Negative for bruising or bleeding. Allergy: Negative for rash or hives.    Physical Exam: BP (!) 170/95   Pulse 94   Temp (!) 97.2 F (36.2 C) (Oral)   Ht 5\' 7"  (1.702 m)   Wt 269 lb 9.6 oz (122.3 kg)   LMP 07/11/2017 (Exact Date)   BMI 42.23 kg/m  General:   Alert and  oriented. Pleasant and cooperative. Well-nourished and well-developed.  Head:  Normocephalic and atraumatic. Eyes:  Without icterus, sclera clear and conjunctiva pink.  Ears:  Normal auditory acuity. Cardiovascular:  S1, S2 present without murmurs appreciated. Extremities without clubbing or edema. Respiratory:  Clear to auscultation bilaterally. No wheezes, rales, or rhonchi. No distress.  Gastrointestinal:  +BS, soft, non-tender and non-distended. No HSM noted. No guarding or rebound. No masses appreciated.  Rectal:  Deferred  Musculoskalatal:  Symmetrical without gross deformities. Neurologic:  Alert and oriented x4;  grossly normal neurologically. Psych:  Alert and cooperative. Normal mood and affect. Heme/Lymph/Immune: No excessive bruising noted.    07/10/2018 8:53 AM   Disclaimer: This note was dictated with voice recognition software. Similar sounding words can inadvertently be transcribed and may not be corrected upon review.

## 2018-07-10 NOTE — Patient Instructions (Signed)
1. As we discussed, write down your CPAP settings and bring them with you to your colonoscopy. 2. We will schedule your colonoscopy for you. 3. Further recommendations will be made after your colonoscopy. 4. Return for follow-up based on recommendations made after colonoscopy. 5. Call us if you have any questions or concerns.  At [facility] we value your feedback. You may receive a survey about your visit today. Please share your experience as we strive to create trusting relationships with our patients to provide genuine, compassionate, quality care.  It was great to meet you today!  I hope you have a great summer!!

## 2018-07-10 NOTE — Telephone Encounter (Signed)
Called and informed pt of pre-op appt 08/18/18 at 12:45pm. Letter mailed.

## 2018-07-10 NOTE — Assessment & Plan Note (Signed)
The patient is overdue for colonoscopy.  Nurse triage was deferred to office visit due to polypharmacy and need for augmented sedation.  Reviewed patient history.  Generally symptomatically GI standpoint.  No contraindications to colonoscopy and we will proceed at this time.  Proceed with TCS on propofol/MAC with Dr. Gala Romney in near future: the risks, benefits, and alternatives have been discussed with the patient in detail. The patient states understanding and desires to proceed.  The patient is currently on Xanax, Cymbalta, hydrocodone.  No other anticoagulants, anxiolytics, chronic pain medications, or antidepressants.  We will plan for the procedure on propofol/MAC to promote adequate sedation.

## 2018-08-04 ENCOUNTER — Telehealth: Payer: Self-pay | Admitting: Internal Medicine

## 2018-08-04 NOTE — Telephone Encounter (Signed)
Called pt, TCS w/Propofol w/RMR rescheduled to 09/11/18 at 2:15pm. LMOVM for Endo scheduler. Will mail new instructions after pre-op appt is rescheduled.

## 2018-08-04 NOTE — Telephone Encounter (Signed)
Called and informed pt of pre-op appt 09/05/18 at 10:00am. Letter mailed with procedure instructions.

## 2018-08-04 NOTE — Telephone Encounter (Signed)
Pt called to cancel her procedure on 9/16 because her daughter is having surgery. She said that she has already called short stay to cancel pre op.  Call [phone] she is wanting to reschedule

## 2018-08-13 NOTE — Patient Instructions (Signed)
   Your procedure is scheduled on: 09/11/2018  Report to [facility] at   12:45  PM.  Call this number if you have problems the morning of surgery: [phone]   Remember:              Follow Directions on the letter you received from Your Physician's office regarding the Bowel Prep  :  Take these medicines the morning of surgery with A SIP OF WATER: Cymbalta, Xanax     Use Pro air inhaler, Symbicort inhaler and Albuterol Neb   Do not wear jewelry, make-up or nail polish.    Do not bring valuables to the hospital.  Contacts, dentures or bridgework may not be worn into surgery.  .   Patients discharged the day of surgery will not be allowed to drive home.     Colonoscopy, Adult, Care After This sheet gives you information about how to care for yourself after your procedure. Your health care provider may also give you more specific instructions. If you have problems or questions, contact your health care provider. What can I expect after the procedure? After the procedure, it is common to have:  A small amount of blood in your stool for 24 hours after the procedure.  Some gas.  Mild abdominal cramping or bloating.  Follow these instructions at home: General instructions   For the first 24 hours after the procedure: ? Do not drive or use machinery. ? Do not sign important documents. ? Do not drink alcohol. ? Do your regular daily activities at a slower pace than normal. ? Eat soft, easy-to-digest foods. ? Rest often.  Take over-the-counter or prescription medicines only as told by your health care provider.  It is up to you to get the results of your procedure. Ask your health care provider, or the department performing the procedure, when your results will be ready. Relieving cramping and bloating  Try walking around when you have cramps or feel bloated.  Apply heat to your abdomen as told by your health care provider. Use a heat source that your health care provider  recommends, such as a moist heat pack or a heating pad. ? Place a towel between your skin and the heat source. ? Leave the heat on for 20-30 minutes. ? Remove the heat if your skin turns bright red. This is especially important if you are unable to feel pain, heat, or cold. You may have a greater risk of getting burned. Eating and drinking  Drink enough fluid to keep your urine clear or pale yellow.  Resume your normal diet as instructed by your health care provider. Avoid heavy or fried foods that are hard to digest.  Avoid drinking alcohol for as long as instructed by your health care provider. Contact a health care provider if:  You have blood in your stool 2-3 days after the procedure. Get help right away if:  You have more than a small spotting of blood in your stool.  You pass large blood clots in your stool.  Your abdomen is swollen.  You have nausea or vomiting.  You have a fever.  You have increasing abdominal pain that is not relieved with medicine. This information is not intended to replace advice given to you by your health care provider. Make sure you discuss any questions you have with your health care provider. Document Released: 07/10/2004 Document Revised: 08/20/2016 Document Reviewed: 02/07/2016 Elsevier Interactive Patient Education  [employer].

## 2018-08-18 ENCOUNTER — Inpatient Hospital Stay (HOSPITAL_COMMUNITY)
Admission: RE | Admit: 2018-08-18 | Discharge: 2018-08-18 | Disposition: A | Discharge status: Home / Self Care | Payer: Self-pay | Source: Ambulatory Visit

## 2018-08-18 ENCOUNTER — Encounter (HOSPITAL_COMMUNITY): Payer: Self-pay

## 2018-09-02 NOTE — Patient Instructions (Signed)
Annette Bryant  09/02/2018     @PREFPERIOPPHARMACY @   Your procedure is scheduled on  09/11/2018 .  Report to [facility] at  1300  P.M.  Call this number if you have problems the morning of surgery:  [phone]   Remember:  Do not eat or drink after midnight.  You may drink clear liquids until (follow the instructions given to you).  Clear liquids allowed are:                    Water, Juice (non-citric and without pulp), Carbonated beverages, Clear Tea, Black Coffee only, Plain Jell-O only, Gatorade and Plain Popsicles only    Take these medicines the morning of surgery with A SIP OF WATER  Xanax ( if needed), cymbalta, hydrocodone ( if needed). Use your nebulizer and your inhaler before you come.    Do not wear jewelry, make-up or nail polish.  Do not wear lotions, powders, or perfumes, or deodorant.  Do not shave 48 hours prior to surgery.  Men may shave face and neck.  Do not bring valuables to the hospital.  [facility] is not responsible for any belongings or valuables.  Contacts, dentures or bridgework may not be worn into surgery.  Leave your suitcase in the car.  After surgery it may be brought to your room.  For patients admitted to the hospital, discharge time will be determined by your treatment team.  Patients discharged the day of surgery will not be allowed to drive home.   Name and phone number of your driver:   family Special instructions:  Follow the diet and prep instructions given to you by Dr Luvenia Starch office.  Please read over the following fact sheets that you were given. Anesthesia Post-op Instructions and Care and Recovery After Surgery       Colonoscopy, Adult A colonoscopy is an exam to look at the large intestine. It is done to check for problems, such as:  Lumps (tumors).  Growths (polyps).  Swelling (inflammation).  Bleeding.  What happens before the procedure? Eating and drinking Follow instructions from your doctor  about eating and drinking. These instructions may include:  A few days before the procedure - follow a low-fiber diet. ? Avoid nuts. ? Avoid seeds. ? Avoid dried fruit. ? Avoid raw fruits. ? Avoid vegetables.  1-3 days before the procedure - follow a clear liquid diet. Avoid liquids that have red or purple dye. Drink only clear liquids, such as: ? Clear broth or bouillon. ? Black coffee or tea. ? Clear juice. ? Clear soft drinks or sports drinks. ? Gelatin dessert. ? Popsicles.  On the day of the procedure - do not eat or drink anything during the 2 hours before the procedure.  Bowel prep If you were prescribed an oral bowel prep:  Take it as told by your doctor. Starting the day before your procedure, you will need to drink a lot of liquid. The liquid will cause you to poop (have bowel movements) until your poop is almost clear or light green.  If your skin or butt gets irritated from diarrhea, you may: ? Wipe the area with wipes that have medicine in them, such as adult wet wipes with aloe and vitamin E. ? Put something on your skin that soothes the area, such as petroleum jelly.  If you throw up (vomit) while drinking the bowel prep, take a break for up to  60 minutes. Then begin the bowel prep again. If you keep throwing up and you cannot take the bowel prep without throwing up, call your doctor.  General instructions  Ask your doctor about changing or stopping your normal medicines. This is important if you take diabetes medicines or blood thinners.  Plan to have someone take you home from the hospital or clinic. What happens during the procedure?  An IV tube may be put into one of your veins.  You will be given medicine to help you relax (sedative).  To reduce your risk of infection: ? Your doctors will wash their hands. ? Your anal area will be washed with soap.  You will be asked to lie on your side with your knees bent.  Your doctor will get a long, thin,  flexible tube ready. The tube will have a camera and a light on the end.  The tube will be put into your anus.  The tube will be gently put into your large intestine.  Air will be delivered into your large intestine to keep it open. You may feel some pressure or cramping.  The camera will be used to take photos.  A small tissue sample may be removed from your body to be looked at under a microscope (biopsy). If any possible problems are found, the tissue will be sent to a lab for testing.  If small growths are found, your doctor may remove them and have them checked for cancer.  The tube that was put into your anus will be slowly removed. The procedure may vary among doctors and hospitals. What happens after the procedure?  Your doctor will check on you often until the medicines you were given have worn off.  Do not drive for 24 hours after the procedure.  You may have a small amount of blood in your poop.  You may pass gas.  You may have mild cramps or bloating in your belly (abdomen).  It is up to you to get the results of your procedure. Ask your doctor, or the department performing the procedure, when your results will be ready. This information is not intended to replace advice given to you by your health care provider. Make sure you discuss any questions you have with your health care provider. Document Released: 12/29/2010 Document Revised: 09/26/2016 Document Reviewed: 02/07/2016 Elsevier Interactive Patient Education  2017 [employer]  Colonoscopy, Adult, Care After This sheet gives you information about how to care for yourself after your procedure. Your health care provider may also give you more specific instructions. If you have problems or questions, contact your health care provider. What can I expect after the procedure? After the procedure, it is common to have:  A small amount of blood in your stool for 24 hours after the procedure.  Some gas.  Mild  abdominal cramping or bloating.  Follow these instructions at home: General instructions   For the first 24 hours after the procedure: ? Do not drive or use machinery. ? Do not sign important documents. ? Do not drink alcohol. ? Do your regular daily activities at a slower pace than normal. ? Eat soft, easy-to-digest foods. ? Rest often.  Take over-the-counter or prescription medicines only as told by your health care provider.  It is up to you to get the results of your procedure. Ask your health care provider, or the department performing the procedure, when your results will be ready. Relieving cramping and bloating  Try walking around when  you have cramps or feel bloated.  Apply heat to your abdomen as told by your health care provider. Use a heat source that your health care provider recommends, such as a moist heat pack or a heating pad. ? Place a towel between your skin and the heat source. ? Leave the heat on for 20-30 minutes. ? Remove the heat if your skin turns bright red. This is especially important if you are unable to feel pain, heat, or cold. You may have a greater risk of getting burned. Eating and drinking  Drink enough fluid to keep your urine clear or pale yellow.  Resume your normal diet as instructed by your health care provider. Avoid heavy or fried foods that are hard to digest.  Avoid drinking alcohol for as long as instructed by your health care provider. Contact a health care provider if:  You have blood in your stool 2-3 days after the procedure. Get help right away if:  You have more than a small spotting of blood in your stool.  You pass large blood clots in your stool.  Your abdomen is swollen.  You have nausea or vomiting.  You have a fever.  You have increasing abdominal pain that is not relieved with medicine. This information is not intended to replace advice given to you by your health care provider. Make sure you discuss any  questions you have with your health care provider. Document Released: 07/10/2004 Document Revised: 08/20/2016 Document Reviewed: 02/07/2016 Elsevier Interactive Patient Education  2018 [employer]  Monitored Anesthesia Care Anesthesia is a term that refers to techniques, procedures, and medicines that help a person stay safe and comfortable during a medical procedure. Monitored anesthesia care, or sedation, is one type of anesthesia. Your anesthesia specialist may recommend sedation if you will be having a procedure that does not require you to be unconscious, such as:  Cataract surgery.  A dental procedure.  A biopsy.  A colonoscopy.  During the procedure, you may receive a medicine to help you relax (sedative). There are three levels of sedation:  Mild sedation. At this level, you may feel awake and relaxed. You will be able to follow directions.  Moderate sedation. At this level, you will be sleepy. You may not remember the procedure.  Deep sedation. At this level, you will be asleep. You will not remember the procedure.  The more medicine you are given, the deeper your level of sedation will be. Depending on how you respond to the procedure, the anesthesia specialist may change your level of sedation or the type of anesthesia to fit your needs. An anesthesia specialist will monitor you closely during the procedure. Let your health care provider know about:  Any allergies you have.  All medicines you are taking, including vitamins, herbs, eye drops, creams, and over-the-counter medicines.  Any use of steroids (by mouth or as a cream).  Any problems you or family members have had with sedatives and anesthetic medicines.  Any blood disorders you have.  Any surgeries you have had.  Any medical conditions you have, such as sleep apnea.  Whether you are pregnant or may be pregnant.  Any use of cigarettes, alcohol, or street drugs. What are the risks? Generally, this is a  safe procedure. However, problems may occur, including:  Getting too much medicine (oversedation).  Nausea.  Allergic reaction to medicines.  Trouble breathing. If this happens, a breathing tube may be used to help with breathing. It will be removed when you  are awake and breathing on your own.  Heart trouble.  Lung trouble.  Before the procedure Staying hydrated Follow instructions from your health care provider about hydration, which may include:  Up to 2 hours before the procedure - you may continue to drink clear liquids, such as water, clear fruit juice, black coffee, and plain tea.  Eating and drinking restrictions Follow instructions from your health care provider about eating and drinking, which may include:  8 hours before the procedure - stop eating heavy meals or foods such as meat, fried foods, or fatty foods.  6 hours before the procedure - stop eating light meals or foods, such as toast or cereal.  6 hours before the procedure - stop drinking milk or drinks that contain milk.  2 hours before the procedure - stop drinking clear liquids.  Medicines Ask your health care provider about:  Changing or stopping your regular medicines. This is especially important if you are taking diabetes medicines or blood thinners.  Taking medicines such as aspirin and ibuprofen. These medicines can thin your blood. Do not take these medicines before your procedure if your health care provider instructs you not to.  Tests and exams  You will have a physical exam.  You may have blood tests done to show: ? How well your kidneys and liver are working. ? How well your blood can clot.  General instructions  Plan to have someone take you home from the hospital or clinic.  If you will be going home right after the procedure, plan to have someone with you for 24 hours.  What happens during the procedure?  Your blood pressure, heart rate, breathing, level of pain and overall  condition will be monitored.  An IV tube will be inserted into one of your veins.  Your anesthesia specialist will give you medicines as needed to keep you comfortable during the procedure. This may mean changing the level of sedation.  The procedure will be performed. After the procedure  Your blood pressure, heart rate, breathing rate, and blood oxygen level will be monitored until the medicines you were given have worn off.  Do not drive for 24 hours if you received a sedative.  You may: ? Feel sleepy, clumsy, or nauseous. ? Feel forgetful about what happened after the procedure. ? Have a sore throat if you had a breathing tube during the procedure. ? Vomit. This information is not intended to replace advice given to you by your health care provider. Make sure you discuss any questions you have with your health care provider. Document Released: 08/22/2005 Document Revised: 05/04/2016 Document Reviewed: 03/18/2016 Elsevier Interactive Patient Education  2018 [employer] Monitored Anesthesia Care, Care After These instructions provide you with information about caring for yourself after your procedure. Your health care provider may also give you more specific instructions. Your treatment has been planned according to current medical practices, but problems sometimes occur. Call your health care provider if you have any problems or questions after your procedure. What can I expect after the procedure? After your procedure, it is common to:  Feel sleepy for several hours.  Feel clumsy and have poor balance for several hours.  Feel forgetful about what happened after the procedure.  Have poor judgment for several hours.  Feel nauseous or vomit.  Have a sore throat if you had a breathing tube during the procedure.  Follow these instructions at home: For at least 24 hours after the procedure:   Do not: ?  Participate in activities in which you could fall or become  injured. ? Drive. ? Use heavy machinery. ? Drink alcohol. ? Take sleeping pills or medicines that cause drowsiness. ? Make important decisions or sign legal documents. ? Take care of children on your own.  Rest. Eating and drinking  Follow the diet that is recommended by your health care provider.  If you vomit, drink water, juice, or soup when you can drink without vomiting.  Make sure you have little or no nausea before eating solid foods. General instructions  Have a responsible adult stay with you until you are awake and alert.  Take over-the-counter and prescription medicines only as told by your health care provider.  If you smoke, do not smoke without supervision.  Keep all follow-up visits as told by your health care provider. This is important. Contact a health care provider if:  You keep feeling nauseous or you keep vomiting.  You feel light-headed.  You develop a rash.  You have a fever. Get help right away if:  You have trouble breathing. This information is not intended to replace advice given to you by your health care provider. Make sure you discuss any questions you have with your health care provider. Document Released: 03/18/2016 Document Revised: 07/18/2016 Document Reviewed: 03/18/2016 Elsevier Interactive Patient Education  [employer].

## 2018-09-05 ENCOUNTER — Encounter (HOSPITAL_COMMUNITY): Payer: Self-pay

## 2018-09-05 ENCOUNTER — Encounter (HOSPITAL_COMMUNITY)
Admission: RE | Admit: 2018-09-05 | Discharge: 2018-09-05 | Disposition: A | Discharge status: Home / Self Care | Payer: Medicare HMO | Source: Ambulatory Visit | Attending: Internal Medicine | Admitting: Internal Medicine

## 2018-09-08 ENCOUNTER — Telehealth: Payer: Self-pay

## 2018-09-08 NOTE — Telephone Encounter (Signed)
Received VM from pt, requested to cancel TCS for 09/11/18. She is sick with UTI. LMOVM to inform endo scheduler. Pt will need OV to reschedule as she has previously rescheduled and will be done with Propofol. Tried to call pt, no answer, LMOVM and informed her she will need OV to reschedule procedure.

## 2018-09-08 NOTE — Telephone Encounter (Signed)
Noted  

## 2018-09-11 ENCOUNTER — Ambulatory Visit (HOSPITAL_COMMUNITY): Admit: 2018-09-11 | Payer: Medicare HMO | Admitting: Internal Medicine

## 2018-09-11 ENCOUNTER — Encounter (HOSPITAL_COMMUNITY): Payer: Self-pay

## 2018-09-11 SURGERY — COLONOSCOPY WITH PROPOFOL
Anesthesia: Monitor Anesthesia Care

## 2018-09-17 ENCOUNTER — Other Ambulatory Visit: Payer: Self-pay

## 2018-09-17 ENCOUNTER — Emergency Department (HOSPITAL_COMMUNITY): Payer: Medicare HMO

## 2018-09-17 ENCOUNTER — Encounter (HOSPITAL_COMMUNITY): Payer: Self-pay | Admitting: Emergency Medicine

## 2018-09-17 ENCOUNTER — Emergency Department (HOSPITAL_COMMUNITY)
Admission: EM | Admit: 2018-09-17 | Discharge: 2018-09-17 | Disposition: A | Discharge status: Home / Self Care | Payer: Medicare HMO | Attending: Emergency Medicine | Admitting: Emergency Medicine

## 2018-09-17 DIAGNOSIS — I10 Essential (primary) hypertension: Secondary | ICD-10-CM | POA: Insufficient documentation

## 2018-09-17 DIAGNOSIS — Z79899 Other long term (current) drug therapy: Secondary | ICD-10-CM | POA: Insufficient documentation

## 2018-09-17 DIAGNOSIS — F419 Anxiety disorder, unspecified: Secondary | ICD-10-CM | POA: Insufficient documentation

## 2018-09-17 DIAGNOSIS — R079 Chest pain, unspecified: Secondary | ICD-10-CM | POA: Diagnosis not present

## 2018-09-17 DIAGNOSIS — J449 Chronic obstructive pulmonary disease, unspecified: Secondary | ICD-10-CM | POA: Insufficient documentation

## 2018-09-17 HISTORY — DX: Anxiety disorder, unspecified: F41.9

## 2018-09-17 LAB — CBC WITH DIFFERENTIAL/PLATELET
ABS IMMATURE GRANULOCYTES: 0.02 10*3/uL (ref 0.00–0.07)
BASOS PCT: 0 %
Basophils Absolute: 0 10*3/uL (ref 0.0–0.1)
Eosinophils Absolute: 0.1 10*3/uL (ref 0.0–0.5)
Eosinophils Relative: 1 %
HCT: 40.1 % (ref 36.0–46.0)
Hemoglobin: 12.4 g/dL (ref 12.0–15.0)
Immature Granulocytes: 0 %
Lymphocytes Relative: 36 %
Lymphs Abs: 2.5 10*3/uL (ref 0.7–4.0)
MCH: 26 pg (ref 26.0–34.0)
MCHC: 30.9 g/dL (ref 30.0–36.0)
MCV: 84.1 fL (ref 80.0–100.0)
MONO ABS: 0.4 10*3/uL (ref 0.1–1.0)
MONOS PCT: 6 %
NEUTROS ABS: 3.9 10*3/uL (ref 1.7–7.7)
Neutrophils Relative %: 57 %
PLATELETS: 296 10*3/uL (ref 150–400)
RBC: 4.77 MIL/uL (ref 3.87–5.11)
RDW: 15.1 % (ref 11.5–15.5)
WBC: 6.9 10*3/uL (ref 4.0–10.5)
nRBC: 0 % (ref 0.0–0.2)

## 2018-09-17 LAB — COMPREHENSIVE METABOLIC PANEL
ALT: 18 U/L (ref 0–44)
ANION GAP: 6 (ref 5–15)
AST: 19 U/L (ref 15–41)
Albumin: 3.7 g/dL (ref 3.5–5.0)
Alkaline Phosphatase: 124 U/L (ref 38–126)
BUN: 10 mg/dL (ref 6–20)
CHLORIDE: 109 mmol/L (ref 98–111)
CO2: 25 mmol/L (ref 22–32)
CREATININE: 0.84 mg/dL (ref 0.44–1.00)
Calcium: 9 mg/dL (ref 8.9–10.3)
Glucose, Bld: 115 mg/dL — ABNORMAL HIGH (ref 70–99)
POTASSIUM: 3.9 mmol/L (ref 3.5–5.1)
SODIUM: 140 mmol/L (ref 135–145)
Total Bilirubin: 0.4 mg/dL (ref 0.3–1.2)
Total Protein: 7.5 g/dL (ref 6.5–8.1)

## 2018-09-17 LAB — TROPONIN I

## 2018-09-17 LAB — D-DIMER, QUANTITATIVE (NOT AT ARMC): D DIMER QUANT: 0.49 ug{FEU}/mL (ref 0.00–0.50)

## 2018-09-17 MED ORDER — LORAZEPAM 2 MG/ML IJ SOLN
0.5000 mg | Freq: Once | INTRAMUSCULAR | Status: AC
  Administered 2018-09-17: 0.5 mg via INTRAVENOUS
  Filled 2018-09-17: qty 1

## 2018-09-17 NOTE — ED Triage Notes (Signed)
Sharp pressure in center of chest that started today.  Does not radiate.

## 2018-09-17 NOTE — ED Provider Notes (Signed)
[facility] EMERGENCY DEPARTMENT Provider Note   CSN: [id number] Arrival date & time: 09/17/18  1310     History   Chief Complaint Chief Complaint  Patient presents with  . Chest Pain    HPI Annette Bryant is a 57 y.o. female.  HPI Patient states she developed left lower leg pain yesterday.  She took 2 hydrocodone for the pain and this has significantly improved.  Today around noon she developed sharp central chest pain associated with diaphoresis and shortness of breath.  This occurred while she was getting out of her car.  She became very anxious and called 911.  States that her symptoms have now resolved.  Denies history of coronary artery disease.  States she had a younger brother die of pulmonary embolism at the age of 70. Past Medical History:  Diagnosis Date  . Anxiety   . Arthritis   . Asthma   . Carpal tunnel syndrome   . COPD (chronic obstructive pulmonary disease) (HCC)   . Depression   . Dyspnea    a. Adm 12/2013 for dyspnea/palps: normal stress-pics-only Lexiscan nuc, echo unremarkable with normal EF, d-dimer normal, ruled out for MI.  Marland Kitchen Elevated lipids   . Hyperglycemia   . Hypertension   . Morbid obesity (HCC)   . Panic attacks   . Sleep apnea    by clinical history    Patient Active Problem List   Diagnosis Date Noted  . Taking multiple medications for chronic disease 07/10/2018  . Right hand pain 01/19/2016  . Left hand pain 01/19/2016  . Bilateral hand pain 01/19/2016  . Morbid obesity (HCC)   . Elevated lipids   . Hyperglycemia   . Hypertension   . Depression   . Sleep apnea   . Dyspnea 12/11/2013  . Heart palpitations 12/11/2013  . Dizziness 12/11/2013    Past Surgical History:  Procedure Laterality Date  . CESAREAN SECTION    . CHOLECYSTECTOMY N/A 08/01/2017   Procedure: LAPAROSCOPIC CHOLECYSTECTOMY;  Surgeon: Manus Rudd, MD;  Location: [facility];  Service: General;  Laterality: N/A;  . TUBAL LIGATION       OB History   None       Home Medications    Prior to Admission medications   Medication Sig Start Date End Date Taking? Authorizing Provider  albuterol (PROVENTIL) (2.5 MG/3ML) 0.083% nebulizer solution Take 2.5 mg by nebulization every 6 (six) hours as needed for wheezing or shortness of breath.    Yes [provider]  ALPRAZolam Prudy Feeler) 1 MG tablet Take 1 tablet (1 mg total) by mouth 2 (two) times daily. Patient taking differently: Take 1 mg by mouth 2 (two) times daily. Sometimes takes once a day 10/29/17  Yes Myrlene Broker, MD  atorvastatin (LIPITOR) 40 MG tablet Take 40 mg by mouth at bedtime.  03/27/17  Yes [provider]  budesonide-formoterol (SYMBICORT) 160-4.5 MCG/ACT inhaler Inhale 2 puffs into the lungs 2 (two) times daily.   Yes [provider]  DULoxetine (CYMBALTA) 60 MG capsule TAKE 1 CAPSULE (60 MG TOTAL) BY MOUTH 2 (TWO) TIMES DAILY. 04/14/18  Yes Myrlene Broker, MD  furosemide (LASIX) 40 MG tablet Take 40 mg by mouth daily as needed for fluid or edema.    Yes [provider]  HYDROcodone-acetaminophen (NORCO) 7.5-325 MG tablet TAKE HALF A TABLET TO 1 TABLET BY MOUTH UP TO 3 TIMES A DAY FOR SEVERE PAIN. 10/12/17  Yes [provider]  KLOR-CON M20 20 MEQ tablet  Take 20 mEq by mouth daily. 05/08/17  Yes [provider]  Multiple Vitamin (MULTIVITAMIN WITH MINERALS) TABS tablet Take 1 tablet by mouth daily.   Yes [provider]  naproxen (NAPROSYN) 500 MG tablet Take 1 tablet (500 mg total) by mouth 2 (two) times daily. Patient taking differently: Take 500 mg by mouth 3 (three) times daily as needed for mild pain.  08/09/15  Yes Horton, Mayer Masker, MD  PROAIR HFA 108 (90 BASE) MCG/ACT inhaler Inhale 1-2 puffs into the lungs every 6 (six) hours as needed for wheezing or shortness of breath.  06/27/15  Yes [provider]  zolpidem (AMBIEN) 10 MG tablet TAKE 1 TABLET BY MOUTH AT BEDTIME FOR SLEEP. Patient taking differently: at  bedtime as needed. TAKE 1 TABLET BY MOUTH AT BEDTIME FOR SLEEP. 10/29/17  Yes Myrlene Broker, MD  polyethylene glycol-electrolytes (TRILYTE) 420 g solution Take 4,000 mLs by mouth as directed. Patient not taking: Reported on 09/17/2018 07/10/18   Rourk, Gerrit Friends, MD    Family History Family History  Problem Relation Age of Onset  . Coronary artery disease Sister 40       stents  . Depression Sister   . Coronary artery disease Sister   . Heart attack Sister 20       CHF, MI  . Schizophrenia Brother   . Alcohol abuse Brother   . Depression Mother   . Alcohol abuse Father   . Colon cancer Neg Hx     Social History Social History   Tobacco Use  . Smoking status: Never Smoker  . Smokeless tobacco: Never Used  Substance Use Topics  . Alcohol use: No  . Drug use: No     Allergies   Patient has no known allergies.   Review of Systems Review of Systems  Constitutional: Positive for diaphoresis. Negative for chills and fever.  Eyes: Negative for visual disturbance.  Respiratory: Positive for shortness of breath. Negative for cough.   Cardiovascular: Positive for chest pain. Negative for palpitations and leg swelling.  Gastrointestinal: Negative for abdominal pain, constipation, diarrhea, nausea and vomiting.  Genitourinary: Negative for dysuria, flank pain, frequency and hematuria.  Musculoskeletal: Negative for back pain, myalgias and neck pain.  Skin: Negative for rash and wound.  Neurological: Negative for dizziness, weakness, light-headedness, numbness and headaches.  Psychiatric/Behavioral: The patient is nervous/anxious.   All other systems reviewed and are negative.    Physical Exam Updated Vital Signs BP (!) 162/86 (BP Location: Right Arm)   Pulse 83   Temp 98.7 F (37.1 C) (Oral)   Resp 18   Ht 5\' 7"  (1.702 m)   Wt 122 kg   LMP 07/11/2017 (Exact Date)   SpO2 100%   BMI 42.13 kg/m   Physical Exam  Constitutional: She is oriented to person, place, and  time. She appears well-developed and well-nourished. No distress.  HENT:  Head: Normocephalic and atraumatic.  Mouth/Throat: Oropharynx is clear and moist. No oropharyngeal exudate.  Eyes: Pupils are equal, round, and reactive to light. EOM are normal.  Neck: Normal range of motion. Neck supple. No JVD present.  Cardiovascular: Regular rhythm. Exam reveals no gallop and no friction rub.  No murmur heard. Tachycardia.  Pulmonary/Chest: Effort normal and breath sounds normal. No stridor. No respiratory distress. She has no wheezes. She has no rales. She exhibits no tenderness.  Abdominal: Soft. Bowel sounds are normal. There is no tenderness. There is no rebound and no guarding.  Musculoskeletal: Normal range  of motion. She exhibits no edema or tenderness.  No lower extremity swelling, asymmetry or tenderness to palpation.  Patient does have some mild pain with range of motion of the left ankle and knee.  No effusions or warmth present.  No obvious injury.  Distal pulses are 2+.  No midline thoracic or lumbar tenderness.  No CVA tenderness.  Lymphadenopathy:    She has no cervical adenopathy.  Neurological: She is alert and oriented to person, place, and time.  Skin: Skin is warm and dry. Capillary refill takes less than 2 seconds. No rash noted. She is not diaphoretic. No erythema.  Psychiatric: She has a normal mood and affect. Her behavior is normal.  Nursing note and vitals reviewed.    ED Treatments / Results  Labs (all labs ordered are listed, but only abnormal results are displayed) Labs Reviewed  COMPREHENSIVE METABOLIC PANEL - Abnormal; Notable for the following components:      Result Value   Glucose, Bld 115 (*)    All other components within normal limits  CBC WITH DIFFERENTIAL/PLATELET  TROPONIN I  D-DIMER, QUANTITATIVE (NOT AT [facility])  TROPONIN I    EKG EKG Interpretation  Date/Time:  Wednesday September 17 2018 13:17:35 EDT Ventricular Rate:  95 PR Interval:    QRS  Duration: 92 QT Interval:  362 QTC Calculation: 456 R Axis:   74 Text Interpretation:  Sinus rhythm Low voltage, precordial leads Borderline T abnormalities, diffuse leads Baseline wander in lead(s) II III aVF V2 Confirmed by Loren Racer ([id number]) on 09/17/2018 1:57:43 PM   Radiology Dg Chest 2 View  Result Date: 09/17/2018 CLINICAL DATA:  Sharp pressure in center of chest. History of asthma and panic attacks. EXAM: CHEST - 2 VIEW COMPARISON:  Chest CT 06/20/2015, 12/10/2013 CXR FINDINGS: The heart size and mediastinal contours are within normal limits. Both lungs are clear. The visualized skeletal structures are unremarkable. IMPRESSION: No active cardiopulmonary disease. Electronically Signed   By: Tollie Eth M.D.   On: 09/17/2018 15:16   Dg Knee Complete 4 Views Left  Result Date: 09/17/2018 CLINICAL DATA:  Knee pain beginning yesterday, worse this morning. No injury. EXAM: LEFT KNEE - COMPLETE 4+ VIEW COMPARISON:  None. FINDINGS: No fracture.  No bone lesion. Mild narrowing of the medial joint space compartment. Small marginal osteophytes from all 3 compartments. Possible small joint effusion. Soft tissues are unremarkable. IMPRESSION: 1. No fracture or bone lesion. 2. Mild degenerative/arthropathic changes. Probable small joint effusion. Electronically Signed   By: Amie Portland M.D.   On: 09/17/2018 16:20    Procedures Procedures (including critical care time)  Medications Ordered in ED Medications  LORazepam (ATIVAN) injection 0.5 mg (0.5 mg Intravenous Given 09/17/18 1428)     Initial Impression / Assessment and Plan / ED Course  I have reviewed the triage vital signs and the nursing notes.  Pertinent labs & imaging results that were available during my care of the patient were reviewed by me and considered in my medical decision making (see chart for details).     Patient remains chest pain-free.  Troponin x2 is normal.  D-dimer is normal EKG without ischemic findings.   Patient does have some risk factors for coronary artery disease.  Will refer to cardiology.  Anxiety definitely has contributing to her symptoms.  Return precautions have been given.  Final Clinical Impressions(s) / ED Diagnoses   Final diagnoses:  Nonspecific chest pain  Anxiety    ED Discharge Orders    None  Loren Racer, MD 09/17/18 Jerene Bears

## 2018-09-29 ENCOUNTER — Encounter: Payer: Self-pay | Admitting: Orthopedic Surgery

## 2018-09-29 ENCOUNTER — Ambulatory Visit (INDEPENDENT_AMBULATORY_CARE_PROVIDER_SITE_OTHER): Payer: Medicare HMO | Admitting: Orthopedic Surgery

## 2018-09-29 ENCOUNTER — Ambulatory Visit (INDEPENDENT_AMBULATORY_CARE_PROVIDER_SITE_OTHER): Payer: Medicare HMO

## 2018-09-29 VITALS — BP 146/97 | HR 89 | Ht 67.0 in | Wt 275.0 lb

## 2018-09-29 DIAGNOSIS — M1712 Unilateral primary osteoarthritis, left knee: Secondary | ICD-10-CM

## 2018-09-29 DIAGNOSIS — M25572 Pain in left ankle and joints of left foot: Secondary | ICD-10-CM

## 2018-09-29 DIAGNOSIS — G8929 Other chronic pain: Secondary | ICD-10-CM

## 2018-09-29 DIAGNOSIS — M7662 Achilles tendinitis, left leg: Secondary | ICD-10-CM

## 2018-09-29 DIAGNOSIS — M1710 Unilateral primary osteoarthritis, unspecified knee: Secondary | ICD-10-CM

## 2018-09-29 NOTE — Progress Notes (Signed)
Progress Note   Patient ID: Annette Bryant, female   DOB: Jun 28, 1961, 57 y.o.   MRN: [id number]   Chief Complaint  Patient presents with  . Knee Pain    left / wants injection   . Ankle Pain    left wants injection     57 year old female presents for bilateral knee injections at her request  She has a history of osteoarthritis  She comes in today complaining of pain in her left knee acute onset no trauma 3 to 4 weeks duration dull aching pain moderate in severity  She also has pain in her left ankle at the Achilles with no history of trauma burning sensation at the Achilles insertion no loss of motion no swelling    Review of Systems  Constitutional: Positive for weight loss. Negative for chills and fever.  Neurological: Negative for tingling, tremors, sensory change, focal weakness and weakness.   Past Medical History:  Diagnosis Date  . Anxiety   . Arthritis   . Asthma   . Carpal tunnel syndrome   . COPD (chronic obstructive pulmonary disease) (HCC)   . Depression   . Dyspnea    a. Adm 12/2013 for dyspnea/palps: normal stress-pics-only Lexiscan nuc, echo unremarkable with normal EF, d-dimer normal, ruled out for MI.  Marland Kitchen Elevated lipids   . Hyperglycemia   . Hypertension   . Morbid obesity (HCC)   . Panic attacks   . Sleep apnea    by clinical history   Past Surgical History:  Procedure Laterality Date  . CESAREAN SECTION    . CHOLECYSTECTOMY N/A 08/01/2017   Procedure: LAPAROSCOPIC CHOLECYSTECTOMY;  Surgeon: Manus Rudd, MD;  Location: [facility];  Service: General;  Laterality: N/A;  . TUBAL LIGATION        No Known Allergies   BP (!) 146/97   Pulse 89   Ht 5\' 7"  (1.702 m)   Wt 275 lb (124.7 kg)   LMP 07/11/2017 (Exact Date)   BMI 43.07 kg/m   Physical Exam  Constitutional: She is oriented to person, place, and time. She appears well-developed and well-nourished.  Neurological: She is alert and oriented to person, place, and time. Gait abnormal.   Patient using cane because of the ankle pain  Psychiatric: She has a normal mood and affect. Judgment normal.  Vitals reviewed.  Left knee alignment is normal tenderness is in the medial compartment the joint effusion is small.  Her range of motion is full there is no crepitance or contracture.  Ligaments are stable and the collateral ligaments as well as the sagittal plane muscle strength and tone are normal the skin is warm dry and intact the limb exhibits normal sensation and vascularity, no tremors noted.  On the right knee alignment is normal there is no effusion full range of motion is recorded and the knee is stable vascular exam is intact muscle tone is normal strength is normal   The left ankle is tender at the Achilles tendon no swelling in the bursa ankle range of motion is normal ankle ligaments are stable.  Medical decisions:   Data  Imaging: Office films were taken today  Ankle x-ray was negative except for the calcification of the Achilles tendon insertion as well as the plantar spur  X-ray interpretation At the hospital she had an x-ray which showed effusion with degenerative joint disease  X-ray report CLINICAL DATA:  Knee pain beginning yesterday, worse this morning. No injury.  EXAM: LEFT KNEE - COMPLETE 4+ VIEW  COMPARISON:  None.  FINDINGS: No fracture.  No bone lesion.  Mild narrowing of the medial joint space compartment. Small marginal osteophytes from all 3 compartments.  Possible small joint effusion.  Soft tissues are unremarkable.  IMPRESSION: 1. No fracture or bone lesion. 2. Mild degenerative/arthropathic changes. Probable small joint effusion.   Electronically Signed   By: Amie Portland M.D.   On: 09/17/2018 16:20   Encounter Diagnoses  Name Primary?  . Chronic pain of left ankle Yes  . Tendonitis, Achilles, left   . Primary localized osteoarthritis of knee     PLAN:   Osteoarthritis left knee recommend  injection Procedure note left knee injection verbal consent was obtained to inject left knee joint  Timeout was completed to confirm the site of injection  The medications used were 40 mg of Depo-Medrol and 1% lidocaine 3 cc  Anesthesia was provided by ethyl chloride and the skin was prepped with alcohol.  After cleaning the skin with alcohol a 20-gauge needle was used to inject the left knee joint. There were no complications. A sterile bandage was applied.    Physical therapy for Achilles tendinitis  Follow-up as needed  Fuller Canada, MD 09/29/2018 2:37 PM

## 2018-09-29 NOTE — Patient Instructions (Signed)
Achilles Tendinitis  Achilles tendinitis is inflammation of the tough, cord-like band that attaches the lower leg muscles to the heel bone (Achilles tendon). This is usually caused by overusing the tendon and the ankle joint.  Achilles tendinitis usually gets better over time with treatment and caring for yourself at home. It can take weeks or months to heal completely.  What are the causes?  This condition may be caused by:  · A sudden increase in exercise or activity, such as running.  · Doing the same exercises or activities (such as jumping) over and over.  · Not warming up calf muscles before exercising.  · Exercising in shoes that are worn out or not made for exercise.  · Having arthritis or a bone growth (spur) on the back of the heel bone. This can rub against the tendon and hurt it.  · Age-related wear and tear. Tendons become less flexible with age and more likely to be injured.    What are the signs or symptoms?  Common symptoms of this condition include:  · Pain in the Achilles tendon or in the back of the leg, just above the heel. The pain usually gets worse with exercise.  · Stiffness or soreness in the back of the leg, especially in the morning.  · Swelling of the skin over the Achilles tendon.  · Thickening of the tendon.  · Bone spurs at the bottom of the Achilles tendon, near the heel.  · Trouble standing on tiptoe.    How is this diagnosed?  This condition is diagnosed based on your symptoms and a physical exam. You may have tests, including:  · X-rays.  · MRI.    How is this treated?  The goal of treatment is to relieve symptoms and help your injury heal. Treatment may include:  · Decreasing or stopping activities that caused the tendinitis. This may mean switching to low-impact exercises like biking or swimming.  · Icing the injured area.  · Doing physical therapy, including strengthening and stretching exercises.  · NSAIDs to help relieve pain and swelling.  · Using supportive shoes, wraps,  heel lifts, or a walking boot (air cast).  · Surgery. This may be done if your symptoms do not improve after 6 months.  · Using high-energy shock wave impulses to stimulate the healing process (extracorporeal shock wave therapy). This is rare.  · Injection of medicines to help relieve inflammation (corticosteroids). This is rare.    Follow these instructions at home:  If you have an air cast:   · Wear the cast as told by your health care provider. Remove it only as told by your health care provider.  · Loosen the cast if your toes tingle, become numb, or turn cold and blue.  Activity   · Gradually return to your normal activities once your health care provider approves. Do not do activities that cause pain.  ? Consider doing low-impact exercises, like cycling or swimming.  · If you have an air cast, ask your health care provider when it is safe for you to drive.  · If physical therapy was prescribed, do exercises as told by your health care provider or physical therapist.  Managing pain, stiffness, and swelling   · Raise (elevate) your foot above the level of your heart while you are sitting or lying down.  · Move your toes often to avoid stiffness and to lessen swelling.  · If directed, put ice on the injured area:  ?   Put ice in a plastic bag.  ? Place a towel between your skin and the bag.  ? Leave the ice on for 20 minutes, 2-3 times a day  General instructions   · If directed, wrap your foot with an elastic bandage or other wrap. This can help keep your tendon from moving too much while it heals. Your health care provider will show you how to wrap your foot correctly.  · Wear supportive shoes or heel lifts only as told by your health care provider.  · Take over-the-counter and prescription medicines only as told by your health care provider.  · Keep all follow-up visits as told by your health care provider. This is important.  Contact a health care provider if:  · You have symptoms that gets worse.  · You have  pain that does not get better with medicine.  · You develop new, unexplained symptoms.  · You develop warmth and swelling in your foot.  · You have a fever.  Get help right away if:  · You have a sudden popping sound or sensation in your Achilles tendon followed by severe pain.  · You cannot move your toes or foot.  · You cannot put any weight on your foot.  Summary  · Achilles tendinitis is inflammation of the tough, cord-like band that attaches the lower leg muscles to the heel bone (Achilles tendon).  · This condition is usually caused by overusing the tendon and the ankle joint. It can also be caused by arthritis or normal aging.  · The most common symptoms of this condition include pain, swelling, or stiffness in the Achilles tendon or in the back of the leg.  · This condition is usually treated with rest, NSAIDs, and physical therapy.  This information is not intended to replace advice given to you by your health care provider. Make sure you discuss any questions you have with your health care provider.  Document Released: 09/05/2005 Document Revised: 10/15/2016 Document Reviewed: 10/15/2016  [employer] Interactive Patient Education © 2017 [employer]

## 2018-10-09 ENCOUNTER — Other Ambulatory Visit: Payer: Self-pay

## 2018-10-09 ENCOUNTER — Ambulatory Visit (HOSPITAL_COMMUNITY): Payer: Medicare HMO | Attending: Orthopedic Surgery

## 2018-10-09 ENCOUNTER — Encounter (HOSPITAL_COMMUNITY): Payer: Self-pay

## 2018-10-09 DIAGNOSIS — M25675 Stiffness of left foot, not elsewhere classified: Secondary | ICD-10-CM | POA: Insufficient documentation

## 2018-10-09 DIAGNOSIS — R2689 Other abnormalities of gait and mobility: Secondary | ICD-10-CM | POA: Insufficient documentation

## 2018-10-09 DIAGNOSIS — M79672 Pain in left foot: Secondary | ICD-10-CM | POA: Insufficient documentation

## 2018-10-09 NOTE — Therapy (Signed)
[facility] [address] [place], [place], [address] Phone: [phone]   Fax:  [phone]  Physical Therapy Evaluation  Patient Details  Name: Annette Bryant MRN: [id number] Date of Birth: [birth year] Referring Provider (PT): Vickki Hearing, MD   Encounter Date: 10/09/2018  PT End of Session - 10/09/18 1446    Visit Number  1    Number of Visits  13    Date for PT Re-Evaluation  11/20/18   mini-reassess due 10/30/18   Authorization Type  [employer] Medicare HMO (no auth required, visits on medical necessity)    Authorization Time Period  10/09/18 - 11/21/18    Authorization - Visit Number  1    Authorization - Number of Visits  13    PT Start Time  1348    PT Stop Time  1430    PT Time Calculation (min)  42 min    Activity Tolerance  Patient tolerated treatment well    Behavior During Therapy  [facility] for tasks assessed/performed       Past Medical History:  Diagnosis Date  . Anxiety   . Arthritis   . Asthma   . Carpal tunnel syndrome   . COPD (chronic obstructive pulmonary disease) (HCC)   . Depression   . Dyspnea    a. Adm 12/2013 for dyspnea/palps: normal stress-pics-only Lexiscan nuc, echo unremarkable with normal EF, d-dimer normal, ruled out for MI.  Marland Kitchen Elevated lipids   . Hyperglycemia   . Hypertension   . Morbid obesity (HCC)   . Panic attacks   . Sleep apnea    by clinical history    Past Surgical History:  Procedure Laterality Date  . CESAREAN SECTION    . CHOLECYSTECTOMY N/A 08/01/2017   Procedure: LAPAROSCOPIC CHOLECYSTECTOMY;  Surgeon: Manus Rudd, MD;  Location: [facility];  Service: General;  Laterality: N/A;  . TUBAL LIGATION      There were no vitals filed for this visit.   Subjective Assessment - 10/09/18 1353    Subjective  Patient reports she has been experiencing pain in Lt ankle/foot for ~2 months. She states it began after she started walking more for exercise and she thought it would go away  over time. She reports her foot hurts immediately when she stands up. Her foot pain increases after ~ 10 minutes of standing or walking. She also is limited by her asthma and carries her inhaler with her whenever she is walking. Patient states she would like to be able to walk in the community with greater ease to not feel limited or embarrassed.    Patient is accompained by:  Family member   daughter Alcario Drought   Limitations  Standing;Walking;House hold activities    How long can you stand comfortably?  10 minutes    How long can you walk comfortably?  10 minutes    Patient Stated Goals  to walk without pain and not feel embarrased about how she is walking around the community    Currently in Pain?  Yes    Pain Score  5     Pain Location  Foot    Pain Orientation  Left    Pain Descriptors / Indicators  Sore    Pain Type  Chronic pain    Pain Onset  More than a month ago    Pain Frequency  Constant    Aggravating Factors   walking, standing    Pain Relieving Factors  rest  [facility] PT Assessment - 10/09/18 0001      Assessment   Medical Diagnosis  Left Achilles Pain    Referring Provider (PT)  Vickki Hearing, MD    Onset Date/Surgical Date  08/08/18   approximate   Hand Dominance  Right    Prior Therapy  none      Precautions   Precautions  None      Restrictions   Weight Bearing Restrictions  No      Balance Screen   Has the patient fallen in the past 6 months  No    Has the patient had a decrease in activity level because of a fear of falling?   No    Is the patient reluctant to leave their home because of a fear of falling?   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[employer]    Leisure  enjoys going to see her youngest son play football at [employer]   Overall Cognitive Status  Within Functional Limits for tasks assessed      Observation/Other Assessments   Focus on Therapeutic Outcomes (FOTO)   57% limited      Functional Tests   Functional tests  Single leg stance      Single Leg Stance   Comments  Rt LE = 21 sec; Lt LE = 22 sec      ROM / Strength   AROM / PROM / Strength  AROM;Strength      AROM   AROM Assessment Site  Ankle    Right/Left Ankle  Right;Left    Right Ankle Dorsiflexion  6    Right Ankle Plantar Flexion  55    Right Ankle Inversion  24    Right Ankle Eversion  22    Left Ankle Dorsiflexion  0    Left Ankle Plantar Flexion  55    Left Ankle Inversion  20    Left Ankle Eversion  25      Strength   Strength Assessment Site  Knee;Ankle;Hip    Right Hip Flexion  4+/5    Right Hip Extension  4+/5    Right Hip ABduction  4/5    Left Hip Flexion  4+/5    Left Hip Extension  4-/5    Left Hip ABduction  4/5    Right/Left Knee  Right;Left    Right Knee Flexion  4+/5    Right Knee Extension  5/5    Left Knee Flexion  4-/5    Left Knee Extension  4+/5    Right/Left Ankle  Right;Left    Right Ankle Dorsiflexion  5/5    Right Ankle Plantar Flexion  4-/5   8/20 heelraise before height decresaed substantially   Right Ankle Inversion  5/5    Right Ankle Eversion  5/5    Left Ankle Dorsiflexion  4+/5    Left Ankle Plantar Flexion  3/5   against gravity (unweighted) unable to perform in standing   Left Ankle Inversion  5/5    Left Ankle Eversion  5/5      Palpation   Palpation comment  tenderness along Lt achiles near insertion at calcaneous      Special Tests    Special Tests  Ankle/Foot Special Tests    Ankle/Foot Special  Tests   Thompson's Test      Thompson's Test   Findings  Negative    Side  Right;Left        Objective measurements completed on examination: See above findings.    PT Education - 10/09/18 1444    Education Details  Educated on appropriate POC for therapy and on insurance plan to notify Monia Pouch about medicaid coverage. Educated on option for payment plan.    Person(s) Educated  Patient    Methods  Explanation    Comprehension  Verbalized understanding       PT Short Term Goals - 10/09/18 1454      PT SHORT TERM GOAL #1   Title  Patient will be independent with HEP, updated PRN, to improve LE strength and Lt ankle mobility to reduce pain and improve mechanics with gait and mobility.    Period  Weeks    Status  New    Target Date  10/23/18      PT SHORT TERM GOAL #2   Title  Patient will improve LT LE strength for limited hip muscles by 1/2 grade and demonstrate ability to perform 5 single limb heel raises on Lt LE without increase in pain.    Time  3    Period  Weeks    Status  New    Target Date  10/30/18      PT SHORT TERM GOAL #3   Title  Patient will improve Lt ankle ROM to 6 degrees DF to equal Rt ankle ROM and demonstrate improved ankle mobility and reduce strain on achilles tendon.     Time  3    Period  Weeks    Status  New        PT Long Term Goals - 10/09/18 1511      PT LONG TERM GOAL #1   Title  Patient will report being able to tolerate 30 minutes of standing without increase in Lt foot pain to be able to perform daily activities lsuch as: laundry, meal preparation, dusting.     Time  6    Period  Weeks    Status  New    Target Date  11/20/18      PT LONG TERM GOAL #2   Title  Patient will demonstrate ability to ascend/descend 12 steps with not antalgic gait, no increase in pain, and step over step pattern to improve functional mobility and ability to attend sons' footbal games and sit in stadium.     Time  6    Period  Weeks    Status  New      PT  LONG TERM GOAL #3   Title  Patient will report being able to perform grocery shopping, ambulating for 30 minutes or more with no increase in Lt foot pain to demonstrate improve functional strength and endurance to improve QOL.    Time  6    Period  Weeks    Status  New  Plan - 10/09/18 1448    Clinical Impression Statement  Ms. Strom presents to physical therapy for evaluation of Lt foot/ankle pain. She has tenderness to palpation along her achilles insertion of the Lt heel. Thomas test is negative indicating her tendon is intact however she is unable to perform 1 repetition of single limb heel raise on Lt LE indicating severe weakness of gastroc/soleus complex. Objective testing reveals weakness of Lt hip muscle and limited ankle ROM on Lt LE. She has reported feeling limited with walking in her community. Ms. Fels will benefit from skilled PT interventions to address impairments and improve mobility to participate in community events and attend her son's sports events without limitation.    History and Personal Factors relevant to plan of care:  Lt knee injection    Clinical Presentation  Stable    Clinical Presentation due to:  MMT, ROM, pain, impaired balance/walking, FOTO    Clinical Decision Making  Low    Rehab Potential  Fair    PT Frequency  2x / week    PT Duration  6 weeks    PT Treatment/Interventions  ADLs/Self Care Home Management;Aquatic Therapy;Cryotherapy;Electrical Stimulation;Iontophoresis 4mg /ml Dexamethasone;Moist Heat;Stair training;DME Instruction;Functional mobility training;Therapeutic activities;Therapeutic exercise;Balance training;Neuromuscular re-education;Patient/family education;Manual techniques;Passive range of motion;Taping;Joint Manipulations    PT Next Visit Plan  Review eval and goals. Initiate eccentric strengthening for Lt gastroc/soleus complex. Begin hip strengthening for Lt LE and balance training. Perform joint mobilization to Lt ankle for  dorsiflexion limitations and STM PRN for pain relief.    PT Home Exercise Plan  initiate next visit    Consulted and Agree with Plan of Care  Patient       Patient will benefit from skilled therapeutic intervention in order to improve the following deficits and impairments:  Abnormal gait, Pain, Decreased mobility, Decreased activity tolerance, Decreased endurance, Decreased range of motion, Decreased strength, Obesity, Impaired flexibility, Difficulty walking, Decreased balance  Visit Diagnosis: Pain in left foot  Stiffness of left foot, not elsewhere classified  Other abnormalities of gait and mobility     Problem List Patient Active Problem List   Diagnosis Date Noted  . Taking multiple medications for chronic disease 07/10/2018  . Right hand pain 01/19/2016  . Left hand pain 01/19/2016  . Bilateral hand pain 01/19/2016  . Morbid obesity (HCC)   . Elevated lipids   . Hyperglycemia   . Hypertension   . Depression   . Sleep apnea   . Dyspnea 12/11/2013  . Heart palpitations 12/11/2013  . Dizziness 12/11/2013    Glyn Ade 10/09/2018, 3:15 PM  [facility] [address] [place], [place], [address] Phone: [phone]   Fax:  [phone]  Name: Annette Bryant MRN: [id number] Date of Birth: [birth year]

## 2018-10-14 ENCOUNTER — Encounter (HOSPITAL_COMMUNITY): Payer: Self-pay

## 2018-10-14 ENCOUNTER — Ambulatory Visit (HOSPITAL_COMMUNITY): Payer: Medicare HMO | Attending: Orthopedic Surgery

## 2018-10-14 DIAGNOSIS — M79672 Pain in left foot: Secondary | ICD-10-CM | POA: Diagnosis present

## 2018-10-14 DIAGNOSIS — R2689 Other abnormalities of gait and mobility: Secondary | ICD-10-CM | POA: Diagnosis present

## 2018-10-14 DIAGNOSIS — M25675 Stiffness of left foot, not elsewhere classified: Secondary | ICD-10-CM | POA: Insufficient documentation

## 2018-10-14 NOTE — Patient Instructions (Signed)
Gastroc Stretch    Stand with right foot back, leg straight, forward leg bent. Keeping heel on floor, turned slightly out, lean into wall until stretch is felt in calf. Hold 30 seconds. Repeat 3 times per set. Do 1-2 sets per session. Do 2 sessions per day.  http://orth.exer.us/27   Copyright  VHI. All rights reserved.   Plantar Fascia Stretch    Standing with only ball of left foot on stair, push heel down until stretch is felt through arch of foot. Hold 30 seconds. Relax. Repeat 3 times per set. Do 1-2 sets per session. Do 2 sessions per day.  http://orth.exer.us/23   Copyright  VHI. All rights reserved.

## 2018-10-14 NOTE — Therapy (Signed)
[facility] [address] [place], [place], [address] Phone: [phone]   Fax:  [phone]  Physical Therapy Treatment  Patient Details  Name: Annette Bryant MRN: [id number] Date of Birth: [birth year] Referring Provider (PT): Vickki Hearing, MD   Encounter Date: 10/14/2018  PT End of Session - 10/14/18 1523    Visit Number  2    Number of Visits  13    Date for PT Re-Evaluation  11/20/18   Minireassess 10/30/18   Authorization Type  [employer] Medicare HMO (no auth required, visits on medical necessity)    Authorization Time Period  10/09/18 - 11/21/18    Authorization - Visit Number  2    Authorization - Number of Visits  13    PT Start Time  1520    PT Stop Time  1559    PT Time Calculation (min)  39 min    Activity Tolerance  Patient tolerated treatment well    Behavior During Therapy  [facility] for tasks assessed/performed       Past Medical History:  Diagnosis Date  . Anxiety   . Arthritis   . Asthma   . Carpal tunnel syndrome   . COPD (chronic obstructive pulmonary disease) (HCC)   . Depression   . Dyspnea    a. Adm 12/2013 for dyspnea/palps: normal stress-pics-only Lexiscan nuc, echo unremarkable with normal EF, d-dimer normal, ruled out for MI.  Marland Kitchen Elevated lipids   . Hyperglycemia   . Hypertension   . Morbid obesity (HCC)   . Panic attacks   . Sleep apnea    by clinical history    Past Surgical History:  Procedure Laterality Date  . CESAREAN SECTION    . CHOLECYSTECTOMY N/A 08/01/2017   Procedure: LAPAROSCOPIC CHOLECYSTECTOMY;  Surgeon: Manus Rudd, MD;  Location: [facility];  Service: General;  Laterality: N/A;  . TUBAL LIGATION      There were no vitals filed for this visit.  Subjective Assessment - 10/14/18 1520    Subjective  Pt stated her Lt foot has some high level tenderness, current pain scale 4-5/10 mainly with weight bearing and gait.      Patient Stated Goals  to walk without pain and not feel  embarrased about how she is walking around the community    Currently in Pain?  Yes    Pain Score  5     Pain Location  Foot    Pain Orientation  Left    Pain Descriptors / Indicators  Tender;Sore    Pain Type  Chronic pain    Pain Onset  More than a month ago    Pain Frequency  Constant    Aggravating Factors   walking, standing    Pain Relieving Factors  rest                       [facility] Adult PT Treatment/Exercise - 10/14/18 0001      Exercises   Exercises  Knee/Hip      Knee/Hip Exercises: Stretches   Gastroc Stretch  3 reps;30 seconds    Gastroc Stretch Limitations  against wall    Other Knee/Hip Stretches  platar stretch 3x 30" on step      Knee/Hip Exercises: Standing   Heel Raises  Both;10 reps    Heel Raises Limitations  heel and toe raises    Hip Abduction  Both;10 reps;Knee straight    Abduction Limitations  cueing for posture  Hip Extension  Both;10 reps;Knee straight    Extension Limitations  cueing for posture      Manual Therapy   Manual Therapy  Soft tissue mobilization    Manual therapy comments  Manual complete separate than rest of tx    Soft tissue mobilization  Plantar surface and gastroc/soleux complex             PT Education - 10/14/18 1601    Education Details  Reviewed goals.  Established HEP to address ankle mobility.  Educated importance of proper gait mechanics.      Person(s) Educated  Patient    Methods  Explanation;Demonstration;Handout    Comprehension  Returned demonstration;Verbalized understanding       PT Short Term Goals - 10/09/18 1454      PT SHORT TERM GOAL #1   Title  Patient will be independent with HEP, updated PRN, to improve LE strength and Lt ankle mobility to reduce pain and improve mechanics with gait and mobility.    Period  Weeks    Status  New    Target Date  10/23/18      PT SHORT TERM GOAL #2   Title  Patient will improve LT LE strength for limited hip muscles by 1/2 grade and  demonstrate ability to perform 5 single limb heel raises on Lt LE without increase in pain.    Time  3    Period  Weeks    Status  New    Target Date  10/30/18      PT SHORT TERM GOAL #3   Title  Patient will improve Lt ankle ROM to 6 degrees DF to equal Rt ankle ROM and demonstrate improved ankle mobility and reduce strain on achilles tendon.     Time  3    Period  Weeks    Status  New        PT Long Term Goals - 10/09/18 1511      PT LONG TERM GOAL #1   Title  Patient will report being able to tolerate 30 minutes of standing without increase in Lt foot pain to be able to perform daily activities lsuch as: laundry, meal preparation, dusting.     Time  6    Period  Weeks    Status  New    Target Date  11/20/18      PT LONG TERM GOAL #2   Title  Patient will demonstrate ability to ascend/descend 12 steps with not antalgic gait, no increase in pain, and step over step pattern to improve functional mobility and ability to attend sons' footbal games and sit in stadium.     Time  6    Period  Weeks    Status  New      PT LONG TERM GOAL #3   Title  Patient will report being able to perform grocery shopping, ambulating for 30 minutes or more with no increase in Lt foot pain to demonstrate improve functional strength and endurance to improve QOL.    Time  6    Period  Weeks    Status  New            Plan - 10/14/18 1853    Clinical Impression Statement  Reviewed goals.  Session focus on mobility and manual for pain control and Bil ankle strengthening exercises.  Establised HEP for ankle mobility, do add strengthening exercies to HEP PRN as pain allows.  Pt able to demonstrate and verbalize appropriate position  and hold times wiht stretches.  Attempted eccentric strengthening exercises, pt limited by pain wiht SLS activities, was able to complete BIl heel/toe raises with minimal c/o wiht exercise.  EOS with manual technqiues to address soft tissue restrictions in gastroc/soleus  complex with reports of relief follwing.  EOS reviewed proper gait mechanics to normalize for pain control.      Rehab Potential  Fair    PT Frequency  2x / week    PT Duration  6 weeks    PT Treatment/Interventions  ADLs/Self Care Home Management;Aquatic Therapy;Cryotherapy;Electrical Stimulation;Iontophoresis 4mg /ml Dexamethasone;Moist Heat;Stair training;DME Instruction;Functional mobility training;Therapeutic activities;Therapeutic exercise;Balance training;Neuromuscular re-education;Patient/family education;Manual techniques;Passive range of motion;Taping;Joint Manipulations    PT Next Visit Plan  Review compliance iwht HEP.  Add rockerboard next session and educate proper gait mechanics.  Initiate eccentric strengthening for Lt gastroc/soleus complex. Begin hip strengthening for Lt LE and balance training. Perform joint mobilization to Lt ankle for dorsiflexion limitations and STM PRN for pain relief.    PT Home Exercise Plan  10/14/18: gastroc and plantar strethces       Patient will benefit from skilled therapeutic intervention in order to improve the following deficits and impairments:  Abnormal gait, Pain, Decreased mobility, Decreased activity tolerance, Decreased endurance, Decreased range of motion, Decreased strength, Obesity, Impaired flexibility, Difficulty walking, Decreased balance  Visit Diagnosis: Pain in left foot  Stiffness of left foot, not elsewhere classified  Other abnormalities of gait and mobility     Problem List Patient Active Problem List   Diagnosis Date Noted  . Taking multiple medications for chronic disease 07/10/2018  . Right hand pain 01/19/2016  . Left hand pain 01/19/2016  . Bilateral hand pain 01/19/2016  . Morbid obesity (HCC)   . Elevated lipids   . Hyperglycemia   . Hypertension   . Depression   . Sleep apnea   . Dyspnea 12/11/2013  . Heart palpitations 12/11/2013  . Dizziness 12/11/2013   Becky Sax, LPTA;  CBIS [phone] Juel Burrow 10/14/2018, 6:58 PM  [facility] [address] [place], [place], [address] Phone: [phone]   Fax:  [phone]  Name: Annette Bryant MRN: [id number] Date of Birth: 08-22-1961

## 2018-10-16 ENCOUNTER — Ambulatory Visit (HOSPITAL_COMMUNITY): Payer: Medicare HMO

## 2018-10-16 ENCOUNTER — Encounter (HOSPITAL_COMMUNITY): Payer: Self-pay

## 2018-10-16 DIAGNOSIS — R2689 Other abnormalities of gait and mobility: Secondary | ICD-10-CM

## 2018-10-16 DIAGNOSIS — M79672 Pain in left foot: Secondary | ICD-10-CM

## 2018-10-16 DIAGNOSIS — M25675 Stiffness of left foot, not elsewhere classified: Secondary | ICD-10-CM

## 2018-10-16 NOTE — Therapy (Signed)
[facility] [address] [place], [place], [address] Phone: [phone]   Fax:  [phone]  Physical Therapy Treatment  Patient Details  Name: Annette Bryant MRN: [id number] Date of Birth: Nov 22, 1961 Referring Provider (PT): Vickki Hearing, MD   Encounter Date: 10/16/2018  PT End of Session - 10/16/18 1448    Visit Number  3    Number of Visits  13    Date for PT Re-Evaluation  11/20/18   Minireassess 10/30/18   Authorization Type  [employer] HMO (no auth required, visits on medical necessity)    Authorization Time Period  10/09/18 - 11/21/18    Authorization - Visit Number  3    Authorization - Number of Visits  13    PT Start Time  1432    PT Stop Time  1513    PT Time Calculation (min)  41 min    Activity Tolerance  Patient tolerated treatment well    Behavior During Therapy  [facility] for tasks assessed/performed       Past Medical History:  Diagnosis Date  . Anxiety   . Arthritis   . Asthma   . Carpal tunnel syndrome   . COPD (chronic obstructive pulmonary disease) (HCC)   . Depression   . Dyspnea    a. Adm 12/2013 for dyspnea/palps: normal stress-pics-only Lexiscan nuc, echo unremarkable with normal EF, d-dimer normal, ruled out for MI.  Marland Kitchen Elevated lipids   . Hyperglycemia   . Hypertension   . Morbid obesity (HCC)   . Panic attacks   . Sleep apnea    by clinical history    Past Surgical History:  Procedure Laterality Date  . CESAREAN SECTION    . CHOLECYSTECTOMY N/A 08/01/2017   Procedure: LAPAROSCOPIC CHOLECYSTECTOMY;  Surgeon: Manus Rudd, MD;  Location: [facility];  Service: General;  Laterality: N/A;  . TUBAL LIGATION      There were no vitals filed for this visit.  Subjective Assessment - 10/16/18 1442    Subjective  Pt stated she feels good, no reports of pain today.  Has began HEP and working on the "pretty walk"    Patient Stated Goals  to walk without pain and not feel embarrased about how she is  walking around the community    Currently in Pain?  No/denies                       [facility] Adult PT Treatment/Exercise - 10/16/18 0001      Exercises   Exercises  Knee/Hip      Knee/Hip Exercises: Stretches   Gastroc Stretch  3 reps;30 seconds    Gastroc Stretch Limitations  against wall    Other Knee/Hip Stretches  platar stretch 3x 30" on step      Knee/Hip Exercises: Standing   Heel Raises  Both;10 reps;2 sets    Heel Raises Limitations  heel and toe raises;2nd set incline slope    Hip Abduction  Both;10 reps    Abduction Limitations  cueing for posture    Hip Extension  Both;Knee straight;15 reps    Extension Limitations  cueing for posture    Rocker Board  2 minutes    Rocker Board Limitations  R/L and Df/Pf    Gait Training  heel to toe mechanics x 223ft    Other Standing Knee Exercises  tandem stance 2x 30"      Knee/Hip Exercises: Seated   Sit to [employer]  10 reps;without UE support      Manual Therapy   Manual Therapy  Soft tissue mobilization    Manual therapy comments  Manual complete separate than rest of tx    Soft tissue mobilization  Plantar surface and gastroc/soleux complex               PT Short Term Goals - 10/09/18 1454      PT SHORT TERM GOAL #1   Title  Patient will be independent with HEP, updated PRN, to improve LE strength and Lt ankle mobility to reduce pain and improve mechanics with gait and mobility.    Period  Weeks    Status  New    Target Date  10/23/18      PT SHORT TERM GOAL #2   Title  Patient will improve LT LE strength for limited hip muscles by 1/2 grade and demonstrate ability to perform 5 single limb heel raises on Lt LE without increase in pain.    Time  3    Period  Weeks    Status  New    Target Date  10/30/18      PT SHORT TERM GOAL #3   Title  Patient will improve Lt ankle ROM to 6 degrees DF to equal Rt ankle ROM and demonstrate improved ankle mobility and reduce strain on achilles tendon.     Time   3    Period  Weeks    Status  New        PT Long Term Goals - 10/09/18 1511      PT LONG TERM GOAL #1   Title  Patient will report being able to tolerate 30 minutes of standing without increase in Lt foot pain to be able to perform daily activities lsuch as: laundry, meal preparation, dusting.     Time  6    Period  Weeks    Status  New    Target Date  11/20/18      PT LONG TERM GOAL #2   Title  Patient will demonstrate ability to ascend/descend 12 steps with not antalgic gait, no increase in pain, and step over step pattern to improve functional mobility and ability to attend sons' footbal games and sit in stadium.     Time  6    Period  Weeks    Status  New      PT LONG TERM GOAL #3   Title  Patient will report being able to perform grocery shopping, ambulating for 30 minutes or more with no increase in Lt foot pain to demonstrate improve functional strength and endurance to improve QOL.    Time  6    Period  Weeks    Status  New            Plan - 10/16/18 1501    Clinical Impression Statement  Session focus on ankle mobility, strengthening and improving gait mechanics.  Added rockerboard to improve weight distribution with gait, STS for gluteal strengthening and balance training.  Continued with Bil gastroc strengthening exercises, may progress to single leg next session if pain still under control.   EOS with manual soft tissue mobilization to address restrictions in gastroc/soleus complete to address ankle mobility.  No reoprts of pain at EOS.      Rehab Potential  Fair    PT Frequency  2x / week    PT Duration  6 weeks    PT Treatment/Interventions  ADLs/Self Care Home Management;Aquatic Therapy;Cryotherapy;Lobbyist  Stimulation;Iontophoresis 4mg /ml Dexamethasone;Moist Heat;Stair training;DME Instruction;Functional mobility training;Therapeutic activities;Therapeutic exercise;Balance training;Neuromuscular re-education;Patient/family education;Manual techniques;Passive  range of motion;Taping;Joint Manipulations    PT Next Visit Plan  Next session begin knee drive wiht manual joint mobs for dorsiflexoin as well as eccentric strengthening for Lt gastroc/soleus complex with Bil heel raise and SLS down.  Begin SLS and progress hip strengthening with resistance and increased reps as able.  Perform joing mobs to Lt ankle for dorsiflexion limitations and STM PRN for pain relief.      PT Home Exercise Plan  10/14/18: gastroc and plantar strethces       Patient will benefit from skilled therapeutic intervention in order to improve the following deficits and impairments:  Abnormal gait, Pain, Decreased mobility, Decreased activity tolerance, Decreased endurance, Decreased range of motion, Decreased strength, Obesity, Impaired flexibility, Difficulty walking, Decreased balance  Visit Diagnosis: Pain in left foot  Stiffness of left foot, not elsewhere classified  Other abnormalities of gait and mobility     Problem List Patient Active Problem List   Diagnosis Date Noted  . Taking multiple medications for chronic disease 07/10/2018  . Right hand pain 01/19/2016  . Left hand pain 01/19/2016  . Bilateral hand pain 01/19/2016  . Morbid obesity (HCC)   . Elevated lipids   . Hyperglycemia   . Hypertension   . Depression   . Sleep apnea   . Dyspnea 12/11/2013  . Heart palpitations 12/11/2013  . Dizziness 12/11/2013   Becky Sax, LPTA; CBIS [phone]  Juel Burrow 10/16/2018, 3:15 PM  [facility] [address] [place], [place], [address] Phone: [phone]   Fax:  [phone]  Name: Annette Bryant MRN: [id number] Date of Birth: 12/17/1960

## 2018-10-21 ENCOUNTER — Ambulatory Visit (HOSPITAL_COMMUNITY): Payer: Medicare HMO

## 2018-10-21 ENCOUNTER — Telehealth (HOSPITAL_COMMUNITY): Payer: Self-pay | Admitting: Family Medicine

## 2018-10-21 NOTE — Telephone Encounter (Signed)
10/21/18  pt called and cx appt but no reason was given

## 2018-10-23 ENCOUNTER — Telehealth (HOSPITAL_COMMUNITY): Payer: Self-pay | Admitting: Family Medicine

## 2018-10-23 ENCOUNTER — Ambulatory Visit (HOSPITAL_COMMUNITY): Payer: Medicare HMO

## 2018-10-23 NOTE — Telephone Encounter (Signed)
10/23/18  pt cx said she has been doing exercises at home and has no more pain, walking faster and overall feels better she wants the remaining appts cx

## 2018-10-28 ENCOUNTER — Encounter (HOSPITAL_COMMUNITY): Payer: Self-pay

## 2018-10-30 ENCOUNTER — Encounter (HOSPITAL_COMMUNITY): Payer: Self-pay

## 2018-11-04 ENCOUNTER — Encounter (HOSPITAL_COMMUNITY): Payer: Self-pay

## 2018-11-11 ENCOUNTER — Encounter (HOSPITAL_COMMUNITY): Payer: Self-pay

## 2018-11-13 ENCOUNTER — Encounter (HOSPITAL_COMMUNITY): Payer: Self-pay

## 2018-12-22 ENCOUNTER — Encounter

## 2018-12-22 ENCOUNTER — Encounter: Payer: Self-pay | Admitting: Orthopedic Surgery

## 2018-12-22 ENCOUNTER — Ambulatory Visit (INDEPENDENT_AMBULATORY_CARE_PROVIDER_SITE_OTHER): Payer: Medicare HMO | Admitting: Orthopedic Surgery

## 2018-12-22 VITALS — BP 147/94 | Ht 67.0 in | Wt 270.0 lb

## 2018-12-22 DIAGNOSIS — M1711 Unilateral primary osteoarthritis, right knee: Secondary | ICD-10-CM

## 2018-12-22 DIAGNOSIS — M1710 Unilateral primary osteoarthritis, unspecified knee: Secondary | ICD-10-CM

## 2018-12-22 NOTE — Progress Notes (Signed)
Chief Complaint  Patient presents with  . Knee Pain    right     Procedure note right knee injection verbal consent was obtained to inject right knee joint  Timeout was completed to confirm the site of injection  The medications used were 40 mg of Depo-Medrol and 1% lidocaine 3 cc  Anesthesia was provided by ethyl chloride and the skin was prepped with alcohol.  After cleaning the skin with alcohol a 20-gauge needle was used to inject the right knee joint. There were no complications. A sterile bandage was applied.   Encounter Diagnosis  Name Primary?  . Primary localized osteoarthritis of knee Yes   Fu prn

## 2018-12-24 ENCOUNTER — Encounter (HOSPITAL_COMMUNITY): Payer: Self-pay

## 2018-12-24 NOTE — Therapy (Signed)
[place], [place], [address] Phone: [phone]   Fax:  [phone]  Patient Details  Name: Annette Bryant MRN: [id number] Date of Birth: Mar 30, 1961 Referring Provider:  No ref. provider found  Encounter Date: 12/24/2018   PHYSICAL THERAPY DISCHARGE SUMMARY  Visits from Start of Care: 3  Current functional level related to goals / functional outcomes: [name] participated in an evaluation and 2 treatment sessions for her Lt foot pain. She was provided initial HEP for stretching and cancelled all of her remaining appointments on 10/23/18 stating she has been doing exercises at home and has no more pain, is walking faster and overall feels better. She is being discharged from this episode of care.   Remaining deficits: See below goals not met:      PT Short Term Goals - 10/09/18 1454            PT SHORT TERM GOAL #1   Title  Patient will be independent with HEP, updated PRN, to improve LE strength and Lt ankle mobility to reduce pain and improve mechanics with gait and mobility.    Period  Weeks    Status  New    Target Date  10/23/18        PT SHORT TERM GOAL #2   Title  Patient will improve LT LE strength for limited hip muscles by 1/2 grade and demonstrate ability to perform 5 single limb heel raises on Lt LE without increase in pain.    Time  3    Period  Weeks    Status  New    Target Date  10/30/18        PT SHORT TERM GOAL #3   Title  Patient will improve Lt ankle ROM to 6 degrees DF to equal Rt ankle ROM and demonstrate improved ankle mobility and reduce strain on achilles tendon.     Time  3    Period  Weeks    Status  New       PT Long Term Goals - 10/09/18 1511            PT LONG TERM GOAL #1   Title  Patient will report being able to tolerate 30 minutes of standing without increase in Lt foot pain to be able to perform daily activities lsuch as: laundry, meal  preparation, dusting.     Time  6    Period  Weeks    Status  New    Target Date  11/20/18        PT LONG TERM GOAL #2   Title  Patient will demonstrate ability to ascend/descend 12 steps with not antalgic gait, no increase in pain, and step over step pattern to improve functional mobility and ability to attend sons' footbal games and sit in stadium.     Time  6    Period  Weeks    Status  New        PT LONG TERM GOAL #3   Title  Patient will report being able to perform grocery shopping, ambulating for 30 minutes or more with no increase in Lt foot pain to demonstrate improve functional strength and endurance to improve QOL.    Time  6    Period  Weeks    Status  New       Education / Equipment: Patient had been educated on benefits of physical therapy and on    Plan: Patient agrees  to discharge.  Patient goals were not met. Patient is being discharged due to the patient's request.  ?????     Kipp Brood, PT, DPT, [facility] Physical Therapist with [facility]  12/24/2018 2:06 PM    [place], [place], [address] Phone: [phone]   Fax:  [phone]

## 2019-02-09 ENCOUNTER — Ambulatory Visit: Payer: Medicare HMO | Admitting: Orthopedic Surgery

## 2019-05-07 ENCOUNTER — Other Ambulatory Visit (HOSPITAL_COMMUNITY): Payer: Self-pay | Admitting: Psychiatry

## 2019-07-02 ENCOUNTER — Encounter (HOSPITAL_COMMUNITY): Payer: Self-pay | Admitting: Emergency Medicine

## 2019-07-02 ENCOUNTER — Emergency Department (HOSPITAL_COMMUNITY)
Admission: EM | Admit: 2019-07-02 | Discharge: 2019-07-02 | Disposition: A | Discharge status: Home / Self Care | Payer: Medicare HMO | Attending: Emergency Medicine | Admitting: Emergency Medicine

## 2019-07-02 ENCOUNTER — Other Ambulatory Visit: Payer: Self-pay

## 2019-07-02 DIAGNOSIS — I10 Essential (primary) hypertension: Secondary | ICD-10-CM | POA: Diagnosis not present

## 2019-07-02 DIAGNOSIS — J449 Chronic obstructive pulmonary disease, unspecified: Secondary | ICD-10-CM | POA: Insufficient documentation

## 2019-07-02 DIAGNOSIS — Z79899 Other long term (current) drug therapy: Secondary | ICD-10-CM | POA: Diagnosis not present

## 2019-07-02 DIAGNOSIS — K056 Periodontal disease, unspecified: Secondary | ICD-10-CM | POA: Insufficient documentation

## 2019-07-02 DIAGNOSIS — K0889 Other specified disorders of teeth and supporting structures: Secondary | ICD-10-CM | POA: Diagnosis present

## 2019-07-02 MED ORDER — OXYCODONE-ACETAMINOPHEN 5-325 MG PO TABS: 1.0000 | ORAL_TABLET | Freq: Once | ORAL | Status: DC

## 2019-07-02 MED ORDER — OXYCODONE-ACETAMINOPHEN 5-325 MG PO TABS: 1.0000 | ORAL_TABLET | Freq: Four times a day (QID) | ORAL | 0 refills | Status: DC | PRN

## 2019-07-02 MED ORDER — PENICILLIN V POTASSIUM 500 MG PO TABS: 500.0000 mg | ORAL_TABLET | Freq: Four times a day (QID) | ORAL | 0 refills | Status: AC

## 2019-07-02 MED ORDER — PENICILLIN V POTASSIUM 250 MG PO TABS
500.0000 mg | ORAL_TABLET | Freq: Once | ORAL | Status: AC
  Administered 2019-07-02: 500 mg via ORAL
  Filled 2019-07-02: qty 2

## 2019-07-02 NOTE — ED Triage Notes (Signed)
Pt c/o gum pain x 24 hours since eating almond ice cream.

## 2019-07-02 NOTE — ED Provider Notes (Signed)
[facility] EMERGENCY DEPARTMENT Provider Note   CSN: [id number] Arrival date & time: 07/02/19  8182     History   Chief Complaint Chief Complaint  Patient presents with  . Dental Pain    HPI Annette Bryant is a 58 y.o. female.     Patient complains of a toothache.  She was eating ice cream and ever since then she has been hurting on the left upper tooth  The history is provided by the patient. No language interpreter was used.  Dental Pain Location:  Upper Upper teeth location: Left upper. Quality:  Aching Severity:  Moderate Onset quality:  Sudden Timing:  Constant Progression:  Worsening Chronicity:  New Associated symptoms: no congestion and no headaches     Past Medical History:  Diagnosis Date  . Anxiety   . Arthritis   . Asthma   . Carpal tunnel syndrome   . COPD (chronic obstructive pulmonary disease) ([place])   . Depression   . Dyspnea    a. Adm 12/2013 for dyspnea/palps: normal stress-pics-only Lexiscan nuc, echo unremarkable with normal EF, d-dimer normal, ruled out for MI.  Marland Kitchen Elevated lipids   . Hyperglycemia   . Hypertension   . Morbid obesity ([place])   . Panic attacks   . Sleep apnea    by clinical history    Patient Active Problem List   Diagnosis Date Noted  . Taking multiple medications for chronic disease 07/10/2018  . Right hand pain 01/19/2016  . Left hand pain 01/19/2016  . Bilateral hand pain 01/19/2016  . Morbid obesity ([place])   . Elevated lipids   . Hyperglycemia   . Hypertension   . Depression   . Sleep apnea   . Dyspnea 12/11/2013  . Heart palpitations 12/11/2013  . Dizziness 12/11/2013    Past Surgical History:  Procedure Laterality Date  . CESAREAN SECTION    . CHOLECYSTECTOMY N/A 08/01/2017   Procedure: LAPAROSCOPIC CHOLECYSTECTOMY;  Surgeon: Donnie Mesa, MD;  Location: [facility];  Service: General;  Laterality: N/A;  . TUBAL LIGATION       OB History   No obstetric history on file.      Home Medications     Prior to Admission medications   Medication Sig Start Date End Date Taking? Authorizing Provider  albuterol (PROVENTIL) (2.5 MG/3ML) 0.083% nebulizer solution Take 2.5 mg by nebulization every 6 (six) hours as needed for wheezing or shortness of breath.     [provider]  ALPRAZolam Duanne Moron) 1 MG tablet Take 1 tablet (1 mg total) by mouth 2 (two) times daily. Patient taking differently: Take 1 mg by mouth 2 (two) times daily. Sometimes takes once a day 10/29/17   Cloria Spring, MD  atorvastatin (LIPITOR) 40 MG tablet Take 40 mg by mouth at bedtime.  03/27/17   [provider]  budesonide-formoterol (SYMBICORT) 160-4.5 MCG/ACT inhaler Inhale 2 puffs into the lungs 2 (two) times daily.    [provider]  DULoxetine (CYMBALTA) 60 MG capsule TAKE 1 CAPSULE (60 MG TOTAL) BY MOUTH 2 (TWO) TIMES DAILY. 04/14/18   Cloria Spring, MD  furosemide (LASIX) 40 MG tablet Take 40 mg by mouth daily as needed for fluid or edema.     [provider]  HYDROcodone-acetaminophen (NORCO) 7.5-325 MG tablet TAKE HALF A TABLET TO 1 TABLET BY MOUTH UP TO 3 TIMES A DAY FOR SEVERE PAIN. 10/12/17   [provider]  KLOR-CON M20 20 MEQ tablet Take 20 mEq by mouth  daily. 05/08/17   [provider]  Multiple Vitamin (MULTIVITAMIN WITH MINERALS) TABS tablet Take 1 tablet by mouth daily.    [provider]  naproxen (NAPROSYN) 500 MG tablet Take 1 tablet (500 mg total) by mouth 2 (two) times daily. Patient taking differently: Take 500 mg by mouth 3 (three) times daily as needed for mild pain.  08/09/15   Horton, Mayer Maskerourtney F, MD  oxyCODONE-acetaminophen (PERCOCET/ROXICET) 5-325 MG tablet Take 1 tablet by mouth every 6 (six) hours as needed. 07/02/19   Bethann BerkshireZammit, Chariti Havel, MD  penicillin v potassium (VEETID) 500 MG tablet Take 1 tablet (500 mg total) by mouth 4 (four) times daily for 7 days. 07/02/19 07/09/19  Bethann BerkshireZammit, Francessca Friis, MD  PROAIR HFA 108 (90 BASE) MCG/ACT inhaler Inhale 1-2  puffs into the lungs every 6 (six) hours as needed for wheezing or shortness of breath.  06/27/15   [provider]  zolpidem (AMBIEN) 10 MG tablet TAKE 1 TABLET BY MOUTH AT BEDTIME FOR SLEEP. Patient not taking: Reported on 12/22/2018 10/29/17   Myrlene Brokeross, Deborah R, MD    Family History Family History  Problem Relation Age of Onset  . Coronary artery disease Sister 40       stents  . Depression Sister   . Coronary artery disease Sister   . Heart attack Sister 2454       CHF, MI  . Schizophrenia Brother   . Alcohol abuse Brother   . Depression Mother   . Alcohol abuse Father   . Colon cancer Neg Hx     Social History Social History   Tobacco Use  . Smoking status: Never Smoker  . Smokeless tobacco: Never Used  Substance Use Topics  . Alcohol use: No  . Drug use: No     Allergies   Patient has no known allergies.   Review of Systems Review of Systems  Constitutional: Negative for appetite change and fatigue.  HENT: Negative for congestion, ear discharge and sinus pressure.        Toothache  Eyes: Negative for discharge.  Respiratory: Negative for cough.   Cardiovascular: Negative for chest pain.  Gastrointestinal: Negative for abdominal pain and diarrhea.  Genitourinary: Negative for frequency and hematuria.  Musculoskeletal: Negative for back pain.  Skin: Negative for rash.  Neurological: Negative for seizures and headaches.  Psychiatric/Behavioral: Negative for hallucinations.     Physical Exam Updated Vital Signs BP (!) 182/92 (BP Location: Right Arm)   Pulse 85   Temp 97.9 F (36.6 C) (Oral)   Resp 18   LMP 07/11/2017 (Exact Date)   SpO2 99%   Physical Exam Vitals signs and nursing note reviewed.  Constitutional:      Appearance: She is well-developed.  HENT:     Head: Normocephalic.     Comments: Tender left upper tooth    Nose: Nose normal.  Eyes:     General: No scleral icterus.    Conjunctiva/sclera: Conjunctivae normal.  Neck:      Musculoskeletal: Neck supple.     Thyroid: No thyromegaly.  Cardiovascular:     Rate and Rhythm: Normal rate and regular rhythm.     Heart sounds: No murmur. No friction rub. No gallop.   Pulmonary:     Breath sounds: No stridor. No wheezing or rales.  Chest:     Chest wall: No tenderness.  Abdominal:     General: There is no distension.     Tenderness: There is no abdominal tenderness. There is no rebound.  Musculoskeletal: Normal range of motion.  Lymphadenopathy:     Cervical: No cervical adenopathy.  Skin:    Findings: No erythema or rash.  Neurological:     Mental Status: She is oriented to person, place, and time.     Motor: No abnormal muscle tone.     Coordination: Coordination normal.  Psychiatric:        Behavior: Behavior normal.      ED Treatments / Results  Labs (all labs ordered are listed, but only abnormal results are displayed) Labs Reviewed - No data to display  EKG None  Radiology No results found.  Procedures Procedures (including critical care time)  Medications Ordered in ED Medications  oxyCODONE-acetaminophen (PERCOCET/ROXICET) 5-325 MG per tablet 1 tablet (has no administration in time range)  penicillin v potassium (VEETID) tablet 500 mg (has no administration in time range)     Initial Impression / Assessment and Plan / ED Course  I have reviewed the triage vital signs and the nursing notes.  Pertinent labs & imaging results that were available during my care of the patient were reviewed by me and considered in my medical decision making (see chart for details).        Patient with infected gums possible tooth abscess.  She is placed on penicillin and Percocet and will follow-up with her dentist.  Her blood pressure is mildly elevated and she was told to get her doctor the recheck in a couple weeks.  Final Clinical Impressions(s) / ED Diagnoses   Final diagnoses:  Tooth pain    ED Discharge Orders         Ordered     penicillin v potassium (VEETID) 500 MG tablet  4 times daily     07/02/19 0752    oxyCODONE-acetaminophen (PERCOCET/ROXICET) 5-325 MG tablet  Every 6 hours PRN     07/02/19 [id number]           Bethann BerkshireZammit, Margot Oriordan, MD 07/02/19 0800

## 2019-07-02 NOTE — Discharge Instructions (Addendum)
Follow up with a dentist next week. °

## 2020-03-17 ENCOUNTER — Other Ambulatory Visit: Payer: Self-pay

## 2020-03-17 ENCOUNTER — Emergency Department (HOSPITAL_COMMUNITY): Payer: Medicare Other

## 2020-03-17 ENCOUNTER — Telehealth: Payer: Self-pay | Admitting: Orthopedic Surgery

## 2020-03-17 ENCOUNTER — Encounter (HOSPITAL_COMMUNITY): Payer: Self-pay | Admitting: *Deleted

## 2020-03-17 ENCOUNTER — Emergency Department (HOSPITAL_COMMUNITY)
Admission: EM | Admit: 2020-03-17 | Discharge: 2020-03-17 | Disposition: A | Discharge status: Home / Self Care | Payer: Medicare Other | Attending: Emergency Medicine | Admitting: Emergency Medicine

## 2020-03-17 DIAGNOSIS — Z79899 Other long term (current) drug therapy: Secondary | ICD-10-CM | POA: Diagnosis not present

## 2020-03-17 DIAGNOSIS — M25552 Pain in left hip: Secondary | ICD-10-CM

## 2020-03-17 DIAGNOSIS — I10 Essential (primary) hypertension: Secondary | ICD-10-CM | POA: Insufficient documentation

## 2020-03-17 MED ORDER — PREDNISONE 50 MG PO TABS
60.0000 mg | ORAL_TABLET | Freq: Once | ORAL | Status: AC
  Administered 2020-03-17: 60 mg via ORAL
  Filled 2020-03-17: qty 1

## 2020-03-17 MED ORDER — LIDOCAINE 5 % EX PTCH
1.0000 | MEDICATED_PATCH | Freq: Once | CUTANEOUS | Status: DC
  Administered 2020-03-17: 12:00:00 1 via TRANSDERMAL
  Filled 2020-03-17: qty 1

## 2020-03-17 MED ORDER — PREDNISONE 20 MG PO TABS: 20.0000 mg | ORAL_TABLET | Freq: Every day | ORAL | 0 refills | Status: AC

## 2020-03-17 MED ORDER — KETOROLAC TROMETHAMINE 60 MG/2ML IM SOLN
15.0000 mg | Freq: Once | INTRAMUSCULAR | Status: AC
  Administered 2020-03-17: 12:00:00 15 mg via INTRAMUSCULAR
  Filled 2020-03-17: qty 2

## 2020-03-17 NOTE — ED Provider Notes (Signed)
[facility] EMERGENCY DEPARTMENT Provider Note   CSN: [id number] Arrival date & time: 03/17/20  1048     History Chief Complaint  Patient presents with  . Hip Pain    left    Annette Bryant is a 59 y.o. female with past medical history significant for COPD, hypertension, hyperlipidemia, prediabetes, sleep apnea presents to emergency department today with chief complaint of progressively worsening left hip pain x2 days.  Patient states she first noticed the pain when she woke up and was getting out of bed.  She describes the pain as sharp.  Pain is located in her left hip and radiates down left thigh.  She rates the pain 10 out of 10 in severity.  She states the pain has been constant.  She has hydrocodone for her chronic back and knee pain, so she took 2 pills yesterday with minimal symptom improvement.  She denies any recent fall or trauma. She also denies any fever, chills, chest pain, shortness of breath, abdominal pain, nausea, gross hematuria, urinary frequency, dysuria, lower extremity edema, numbness, tingling.  History provided by patient with additional history obtained from chart review.     Past Medical History:  Diagnosis Date  . Anxiety   . Arthritis   . Asthma   . Carpal tunnel syndrome   . COPD (chronic obstructive pulmonary disease) ([place])   . Depression   . Dyspnea    a. Adm 12/2013 for dyspnea/palps: normal stress-pics-only Lexiscan nuc, echo unremarkable with normal EF, d-dimer normal, ruled out for MI.  Marland Kitchen Elevated lipids   . Hyperglycemia   . Hypertension   . Morbid obesity ([place])   . Panic attacks   . Sleep apnea    by clinical history    Patient Active Problem List   Diagnosis Date Noted  . Taking multiple medications for chronic disease 07/10/2018  . Right hand pain 01/19/2016  . Left hand pain 01/19/2016  . Bilateral hand pain 01/19/2016  . Morbid obesity ([place])   . Elevated lipids   . Hyperglycemia   . Hypertension   . Depression   . Sleep apnea    . Dyspnea 12/11/2013  . Heart palpitations 12/11/2013  . Dizziness 12/11/2013    Past Surgical History:  Procedure Laterality Date  . CESAREAN SECTION    . CHOLECYSTECTOMY N/A 08/01/2017   Procedure: LAPAROSCOPIC CHOLECYSTECTOMY;  Surgeon: Donnie Mesa, MD;  Location: [facility]S;  Service: General;  Laterality: N/A;  . TUBAL LIGATION       OB History   No obstetric history on file.     Family History  Problem Relation Age of Onset  . Coronary artery disease Sister 54       stents  . Depression Sister   . Coronary artery disease Sister   . Heart attack Sister 84       CHF, MI  . Schizophrenia Brother   . Alcohol abuse Brother   . Depression Mother   . Alcohol abuse Father   . Colon cancer Neg Hx     Social History   Tobacco Use  . Smoking status: Never Smoker  . Smokeless tobacco: Never Used  Substance Use Topics  . Alcohol use: No  . Drug use: No    Home Medications Prior to Admission medications   Medication Sig Start Date End Date Taking? Authorizing Provider  albuterol (PROVENTIL) (2.5 MG/3ML) 0.083% nebulizer solution Take 2.5 mg by nebulization every 6 (six) hours as needed for wheezing or shortness of breath.  Yes [provider]  ALPRAZolam Prudy Feeler) 1 MG tablet Take 1 tablet (1 mg total) by mouth 2 (two) times daily. Patient taking differently: Take 1 mg by mouth 2 (two) times daily. Sometimes takes once a day 10/29/17  Yes Myrlene Broker, MD  atorvastatin (LIPITOR) 40 MG tablet Take 40 mg by mouth at bedtime.  03/27/17  Yes [provider]  budesonide-formoterol (SYMBICORT) 160-4.5 MCG/ACT inhaler Inhale 2 puffs into the lungs 2 (two) times daily.   Yes [provider]  furosemide (LASIX) 40 MG tablet Take 40 mg by mouth daily as needed for fluid or edema.    Yes [provider]  HYDROcodone-acetaminophen (NORCO) 7.5-325 MG tablet TAKE HALF A TABLET TO 1 TABLET BY MOUTH UP TO 3 TIMES A DAY FOR SEVERE PAIN. 10/12/17  Yes  [provider]  Multiple Vitamin (MULTIVITAMIN WITH MINERALS) TABS tablet Take 1 tablet by mouth daily.   Yes [provider]  PROAIR HFA 108 (90 BASE) MCG/ACT inhaler Inhale 1-2 puffs into the lungs every 6 (six) hours as needed for wheezing or shortness of breath.  06/27/15  Yes [provider]  predniSONE (DELTASONE) 20 MG tablet Take 1 tablet (20 mg total) by mouth daily for 4 days. 03/18/20 03/22/20  Wilbert Hayashi E, PA-C  zolpidem (AMBIEN) 10 MG tablet TAKE 1 TABLET BY MOUTH AT BEDTIME FOR SLEEP. Patient not taking: Reported on 12/22/2018 10/29/17   Myrlene Broker, MD    Allergies    Patient has no known allergies.  Review of Systems   Review of Systems All other systems are reviewed and are negative for acute change except as noted in the HPI.  Physical Exam Updated Vital Signs BP (!) 144/95 (BP Location: Left Arm)   Pulse 93   Temp 98.4 F (36.9 C) (Oral)   Resp 16   Ht 5\' 7"  (1.702 m)   Wt 122.5 kg   LMP 07/11/2017 (Exact Date)   SpO2 97%   BMI 42.29 kg/m   Physical Exam Vitals and nursing note reviewed.  Constitutional:      General: She is not in acute distress.    Appearance: She is not ill-appearing.  HENT:     Head: Normocephalic and atraumatic.     Right Ear: Tympanic membrane and external ear normal.     Left Ear: Tympanic membrane and external ear normal.     Nose: Nose normal.     Mouth/Throat:     Mouth: Mucous membranes are moist.     Pharynx: Oropharynx is clear.  Eyes:     General: No scleral icterus.       Right eye: No discharge.        Left eye: No discharge.     Extraocular Movements: Extraocular movements intact.     Conjunctiva/sclera: Conjunctivae normal.     Pupils: Pupils are equal, round, and reactive to light.  Neck:     Vascular: No JVD.  Cardiovascular:     Rate and Rhythm: Normal rate and regular rhythm.     Pulses: Normal pulses.          Radial pulses are 2+ on the right side and 2+ on the left  side.       Dorsalis pedis pulses are 2+ on the right side and 2+ on the left side.     Heart sounds: Normal heart sounds.  Pulmonary:     Comments: Lungs clear to auscultation in all fields. Symmetric chest rise. No  wheezing, rales, or rhonchi. Abdominal:     Palpations: There is no mass.     Hernia: No hernia is present.     Comments: Abdomen is soft, non-distended, and non-tender in all quadrants. No rigidity, no guarding. No peritoneal signs.  Musculoskeletal:     Cervical back: Normal range of motion.       Legs:     Comments: Tenderness to palpation as depicted in image above.  No overlying skin changes.  Full passive range of motion of left hip.  Full range of motion of left knee and ankle.  Tenderness to palpation of thoracic or lumbar minus processes.  No crepitus or step off.  Homans sign absent bilaterally, no lower extremity edema, no palpable cords, compartments are soft  Ambulates with antalgic gait  Skin:    General: Skin is warm and dry.     Capillary Refill: Capillary refill takes less than 2 seconds.  Neurological:     Mental Status: She is oriented to person, place, and time.     GCS: GCS eye subscore is 4. GCS verbal subscore is 5. GCS motor subscore is 6.     Comments: Fluent speech, no facial droop.  Sensation grossly intact to light touch in the lower extremities bilaterally. No saddle anesthesias. Strength 5/5 with flexion and extension at the bilateral hips, knees, and ankles.   Psychiatric:        Behavior: Behavior normal.     ED Results / Procedures / Treatments   Labs (all labs ordered are listed, but only abnormal results are displayed) Labs Reviewed - No data to display  EKG None  Radiology DG Hip Unilat W or Wo Pelvis 2-3 Views Left  Result Date: 03/17/2020 CLINICAL DATA:  LEFT hip pain for 2 days, unable to walk due to pain EXAM: DG HIP (WITH OR WITHOUT PELVIS) 2-3V LEFT COMPARISON:  None FINDINGS: Low normal osseous mineralization. Hip  and SI joint spaces preserved. No fracture, dislocation, or bone destruction. IMPRESSION: No osseous abnormalities. Electronically Signed   By: Ulyses Southward M.D.   On: 03/17/2020 12:27    Procedures Procedures (including critical care time)  Medications Ordered in ED Medications  lidocaine (LIDODERM) 5 % 1 patch (1 patch Transdermal Patch Applied 03/17/20 1218)  ketorolac (TORADOL) injection 15 mg (15 mg Intramuscular Given 03/17/20 1228)  predniSONE (DELTASONE) tablet 60 mg (60 mg Oral Given 03/17/20 1405)    ED Course  I have reviewed the triage vital signs and the nursing notes.  Pertinent labs & imaging results that were available during my care of the patient were reviewed by me and considered in my medical decision making (see chart for details).    MDM Rules/Calculators/A&P                      Patient seen and examined. Patient presents awake, alert, hemodynamically stable, afebrile, non toxic.  On exam patient has tenderness to palpation over left hip.  No overlying skin changes.  She does have full passive range of motion of left hip well has normal range of motion of left knee and ankle.  She describes the pain as sharp and shooting with minimal radiation down her leg.  Given her age and no known history of sciatica, xray of left hip ordered.  Chart review shows she had lab work x1 year ago with normal kidney function.  Patient given IM Toradol, lidocaine patch and prednisone.  On reassessment pain has notably improved.  She ambulates with a cane but appears to be in less much less pain.  X-ray of left hip viewed by me without any signs of fracture or dislocation.  There is concern for blood clot in her leg, however exam today has no findings to suggest DVT.  No lower extremity edema with Homans sign absent bilaterally, no palpable cords, compartments are soft.  She does not have any recent trauma or long period of immobilization.  She has no back pain and lower extremity neuro exam is  normal.  No red flags.  Will discharge home with symptomatic care.  Patient has an appointment scheduled with her PCP tomorrow, I recommend she keep this for follow-up.  She is also an established patient with orthopedics Dr. Romeo Apple.  Recommend she follow-up if she continues to have pain despite short burst of prednisone.  The patient appears reasonably screened and/or stabilized for discharge and I doubt any other medical condition or other [facility] requiring further screening, evaluation, or treatment in the ED at this time prior to discharge. The patient is safe for discharge with strict return precautions discussed.    Portions of this note were generated with Scientist, clinical (histocompatibility and immunogenetics). Dictation errors may occur despite best attempts at proofreading.   Final Clinical Impression(s) / ED Diagnoses Final diagnoses:  Left hip pain    Rx / DC Orders ED Discharge Orders         Ordered    predniSONE (DELTASONE) 20 MG tablet  Daily     03/17/20 1355           Sherene Sires, PA-C 03/17/20 1424    Vanetta Mulders, MD 03/18/20 [phone]

## 2020-03-17 NOTE — Discharge Instructions (Addendum)
You have been seen today for hip pain. Please read and follow all provided instructions. Return to the emergency room for worsening condition or new concerning symptoms.    1. Medications:  Prescription sent to your pharmacy for prednisone.  This is a steroid.  Please take as prescribed.  You received the first dose here in the emergency department.  You should start taking it tomorrow. Continue usual home medications Take medications as prescribed. Please review all of the medicines and only take them if you do not have an allergy to them.   2. Treatment: rest.  Recommend trying heat or ice for your pain to see if that will help.  3. Follow Up: Please keep your appointment with your primary care provider scheduled for tomorrow for follow-up.  If you continue to have pain recommend you follow-up with orthopedics Dr. Romeo Apple.   It is also a possibility that you have an allergic reaction to any of the medicines that you have been prescribed - Everybody reacts differently to medications and while MOST people have no trouble with most medicines, you may have a reaction such as nausea, vomiting, rash, swelling, shortness of breath. If this is the case, please stop taking the medicine immediately and contact your physician.  ?

## 2020-03-17 NOTE — ED Triage Notes (Signed)
Pt with left hip pain for past 2 days and unable to walk per pt due to pain.  Pt concerned for blood clot, has a family hx of blood clots.

## 2020-03-17 NOTE — ED Notes (Signed)
Pt. O2 levels were down in the 80s while sleeping. Placed nasal cannula going at 2 L. Pt. States they have a history of sleep apnea.

## 2020-03-17 NOTE — Telephone Encounter (Signed)
Annette Bryant called and stated that she was having a lot of pain in her left hip.  She said she had tried to get in touch with her PCP, Dr Sudie Bailey, for 2 days but had not been able to reach him.  She wanted to see Dr. Romeo Apple today.  I told her that Dr. Romeo Apple was not in the office.  She was upset and said she was afraid that she has a blood clot.  She asked what she needs to do.  I spoke with my office supervisor, Cherre Huger.  She suggested that if the patient was having that much pain and there was a concern regarding a blood clot, she should go to the ED.  I told Annette Bryant what Toniann Fail suggested.  She said she would probably do that.

## 2020-03-17 NOTE — ED Notes (Signed)
Pt. To CT

## 2020-07-11 ENCOUNTER — Telehealth: Payer: Self-pay | Admitting: Nurse Practitioner

## 2020-07-11 ENCOUNTER — Encounter: Payer: Self-pay | Admitting: Internal Medicine

## 2020-07-11 NOTE — Telephone Encounter (Signed)
PATIENT CALLED TO RESCHEDULE PROCEDURE FROM 2019-DOES SHE NEED A NURSE OR OFFICE VISIT?

## 2020-07-11 NOTE — Telephone Encounter (Signed)
Her original ov was 2019. She needs an OV.

## 2020-08-10 ENCOUNTER — Other Ambulatory Visit (HOSPITAL_COMMUNITY): Payer: Self-pay | Admitting: Family Medicine

## 2020-08-10 DIAGNOSIS — Z1231 Encounter for screening mammogram for malignant neoplasm of breast: Secondary | ICD-10-CM

## 2020-09-01 NOTE — Progress Notes (Signed)
Referring Provider: Gareth Morgan, MD Primary Care Physician:  Gareth Morgan, MD Primary GI Physician: Dr. Jena Gauss  Chief Complaint  Patient presents with  . Consult    TCS never done as previously scheduled 2019    HPI:   Annette Bryant is a 59 y.o. female presenting today to discuss rescheduling her first ever colonoscopy.  She was initially seen for this back in August 2019.  She had no concerning GI symptoms and was scheduled for colonoscopy but ultimately canceled.  Today: No prior colonoscopy. No abdominal pain. BMs every other day. No BRBPR or black stools. No FHX of colon cancer. No nausea or vomiting. No GERD symptoms or dysphagia.   States her primary concern is weight loss.  She is trying to lose weight but cannot.  Cut soda and sweets. She is trying to exercise on her bike every other day for 15 minutes.  She has limited exercise tolerance with associated shortness of breath with exertion.. States this is the biggest she has been in her life. Gained weight during COVID. Bakes chicken and vegetables. No fried/fatty foods. No fast foods.  She is interested in weight loss clinic.  Has bad sleep apnea using CPAP nightly.  Chronic shortness of breath with exertion.  No shortness of breath at rest.  No chest pain or heart palpitations.  Chronic low back pain taking Norco. Also with arthritis.   Past Medical History:  Diagnosis Date  . Anxiety   . Arthritis   . Asthma   . Carpal tunnel syndrome   . COPD (chronic obstructive pulmonary disease) ([facility])   . Depression   . Dyspnea    a. Adm 12/2013 for dyspnea/palps: normal stress-pics-only Lexiscan nuc, echo unremarkable with normal EF, d-dimer normal, ruled out for MI.  Marland Kitchen Elevated lipids   . HLD (hyperlipidemia)   . Hyperglycemia   . Hypertension   . Morbid obesity ([facility])   . Panic attacks   . Sleep apnea    uses CPAP    Past Surgical History:  Procedure Laterality Date  . CESAREAN SECTION    . CHOLECYSTECTOMY N/A  08/01/2017   Procedure: LAPAROSCOPIC CHOLECYSTECTOMY;  Surgeon: Manus Rudd, MD;  Location: [facility];  Service: General;  Laterality: N/A;  . TUBAL LIGATION      Current Outpatient Medications  Medication Sig Dispense Refill  . albuterol (PROVENTIL) (2.5 MG/3ML) 0.083% nebulizer solution Take 2.5 mg by nebulization every 6 (six) hours as needed for wheezing or shortness of breath.     . ALPRAZolam (XANAX) 1 MG tablet Take 1 tablet (1 mg total) by mouth 2 (two) times daily. (Patient taking differently: Take 1 mg by mouth 2 (two) times daily. Sometimes takes once a day) 60 tablet 2  . atorvastatin (LIPITOR) 40 MG tablet Take 40 mg by mouth at bedtime.   3  . budesonide-formoterol (SYMBICORT) 160-4.5 MCG/ACT inhaler Inhale 2 puffs into the lungs 2 (two) times daily.    . furosemide (LASIX) 40 MG tablet Take 40 mg by mouth daily as needed for fluid or edema.     Marland Kitchen HYDROcodone-acetaminophen (NORCO) 7.5-325 MG tablet TAKE HALF A TABLET TO 1 TABLET BY MOUTH UP TO 3 TIMES A DAY FOR SEVERE PAIN.  0  . KLOR-CON M20 20 MEQ tablet Take 20 mEq by mouth daily.    . Multiple Vitamin (MULTIVITAMIN WITH MINERALS) TABS tablet Take 1 tablet by mouth daily.    . naproxen (NAPROSYN) 500 MG tablet Take 500 mg by mouth  2 (two) times daily with a meal. As needed    . PROAIR HFA 108 (90 BASE) MCG/ACT inhaler Inhale 1-2 puffs into the lungs every 6 (six) hours as needed for wheezing or shortness of breath.      No current facility-administered medications for this visit.    Allergies as of 09/02/2020  . (No Known Allergies)    Family History  Problem Relation Age of Onset  . Coronary artery disease Sister 40       stents  . Depression Sister   . Coronary artery disease Sister   . Heart attack Sister 67       CHF, MI  . Schizophrenia Brother   . Alcohol abuse Brother   . Depression Mother   . Alcohol abuse Father   . Colon cancer Neg Hx     Social History   Socioeconomic History  . Marital status:  Divorced    Spouse name: Not on file  . Number of children: Not on file  . Years of education: Not on file  . Highest education level: Not on file  Occupational History  . Not on file  Tobacco Use  . Smoking status: Never Smoker  . Smokeless tobacco: Never Used  Vaping Use  . Vaping Use: Never used  Substance and Sexual Activity  . Alcohol use: No  . Drug use: No  . Sexual activity: Not Currently    Birth control/protection: Surgical  Other Topics Concern  . Not on file  Social History Narrative  . Not on file   Social Determinants of Health   Financial Resource Strain:   . Difficulty of Paying Living Expenses: Not on file  Food Insecurity:   . Worried About Programme researcher, broadcasting/film/video in the Last Year: Not on file  . Ran Out of Food in the Last Year: Not on file  Transportation Needs:   . Lack of Transportation (Medical): Not on file  . Lack of Transportation (Non-Medical): Not on file  Physical Activity:   . Days of Exercise per Week: Not on file  . Minutes of Exercise per Session: Not on file  Stress:   . Feeling of Stress : Not on file  Social Connections:   . Frequency of Communication with Friends and Family: Not on file  . Frequency of Social Gatherings with Friends and Family: Not on file  . Attends Religious Services: Not on file  . Active Member of Clubs or Organizations: Not on file  . Attends Banker Meetings: Not on file  . Marital Status: Not on file    Review of Systems: Gen: Denies fever, chills, cold or flulike symptoms, lightheadedness, dizziness, presyncope, syncope. CV: See HPI Resp: See HPI.  GI: See HPI Derm: Denies rash Psych: Admits to anxiety.  Heme: See HPI.  Physical Exam: BP (!) 150/95   Pulse 87   Temp 97.6 F (36.4 C) (Oral)   Ht 5\' 7"  (1.702 m)   Wt 276 lb 9.6 oz (125.5 kg)   LMP 07/11/2017 (Exact Date)   BMI 43.32 kg/m  General:   Alert and oriented. No distress noted. Pleasant and cooperative.  Head:   Normocephalic and atraumatic. Eyes:  Conjuctiva clear without scleral icterus. Heart:  S1, S2 present without murmurs appreciated. Lungs:  Clear to auscultation bilaterally. No wheezes, rales, or rhonchi. No distress.  Abdomen:  +BS.  Obese abdomen, soft, non-tender and non-distended. No rebound or guarding. No HSM or masses noted. Msk: Symmetrical without gross deformities.  Normal posture. Extremities:  Without edema. Neurologic:  Alert and  oriented x4 Psych:  Normal mood and affect.

## 2020-09-02 ENCOUNTER — Other Ambulatory Visit: Payer: Self-pay

## 2020-09-02 ENCOUNTER — Telehealth: Payer: Self-pay | Admitting: *Deleted

## 2020-09-02 ENCOUNTER — Ambulatory Visit (INDEPENDENT_AMBULATORY_CARE_PROVIDER_SITE_OTHER): Payer: Medicare Other | Admitting: Gastroenterology

## 2020-09-02 ENCOUNTER — Encounter: Payer: Self-pay | Admitting: *Deleted

## 2020-09-02 ENCOUNTER — Encounter: Payer: Self-pay | Admitting: Gastroenterology

## 2020-09-02 DIAGNOSIS — Z1211 Encounter for screening for malignant neoplasm of colon: Secondary | ICD-10-CM

## 2020-09-02 NOTE — Assessment & Plan Note (Signed)
59 year old female with morbid obesity, BMI 43.3.  Per patient, she is having difficulty losing weight.  She has limited exercise tolerance due to shortness of breath with exertion, history of COPD.  She is interested in referral to weight loss clinic.  Plan: Refer to [facility] with [facility]. Advised that she could use my fitness app to help with weight loss.  Increase exercise to 15 minutes daily and continue to increase gradually increase as tolerated.

## 2020-09-02 NOTE — Telephone Encounter (Signed)
Called pt. TCS with Dr. Jena Gauss, asa 3, propofol has been scheduled for 12/13 at 12:00pm. Patient aware will mail instructions with pre-op/covid test appt.

## 2020-09-02 NOTE — Assessment & Plan Note (Addendum)
59 year old female presenting today to discuss scheduling her first ever colonoscopy.  She has no significant upper or lower GI symptoms.  No alarm symptoms.  No family history of colon cancer.  Her primary concern today is obesity with desired weight loss which is discussed below.  Plan: Proceed with colonoscopy with propofol with Dr. Jena Gauss in the near future. The risks, benefits, and alternatives have been discussed with the patient in detail. The patient states understanding and desires to proceed.  ASA III (morbid obesity, sleep apnea-severe per patient using CPAP, COPD) Follow-up as Dr. Jena Gauss recommends or sooner if needed.

## 2020-09-02 NOTE — Patient Instructions (Addendum)
We will get you scheduled for colonoscopy with Dr. Jena Gauss in the near future.  We will place a referral to [facility] with [facility].   You may try using my fitness pal app to help you with weight loss.  I recommend you try increasing your exercise to 15 minutes daily and continue to gradually increase as tolerated.  We will follow up with you in the office as Dr. Jena Gauss recommends.  Do not hesitate to call the office questions or concerns.  Ermalinda Memos, PA-C [facility]

## 2020-09-19 ENCOUNTER — Other Ambulatory Visit: Payer: Self-pay

## 2020-09-19 ENCOUNTER — Ambulatory Visit (HOSPITAL_COMMUNITY)
Admission: RE | Admit: 2020-09-19 | Discharge: 2020-09-19 | Disposition: A | Discharge status: Home / Self Care | Payer: Medicare Other | Source: Ambulatory Visit | Attending: Family Medicine | Admitting: Family Medicine

## 2020-09-19 DIAGNOSIS — Z1231 Encounter for screening mammogram for malignant neoplasm of breast: Secondary | ICD-10-CM | POA: Diagnosis present

## 2020-11-15 NOTE — Patient Instructions (Signed)
Your procedure is scheduled on: 11/21/2020  Report to [facility] at  10:45   AM.  Call this number if you have problems the morning of surgery: [phone]   Remember:              Follow Directions on the letter you received from Your Physician's office regarding the Bowel Prep              No Smoking the day of Procedure :   Take these medicines the morning of surgery with A SIP OF WATER:  xanax and use inhalers if needed    Do not wear jewelry, make-up or nail polish.    Do not bring valuables to the hospital.  Contacts, dentures or bridgework may not be worn into surgery.  .   Patients discharged the day of surgery will not be allowed to drive home.     Colonoscopy, Adult, Care After This sheet gives you information about how to care for yourself after your procedure. Your health care provider may also give you more specific instructions. If you have problems or questions, contact your health care provider. What can I expect after the procedure? After the procedure, it is common to have:  A small amount of blood in your stool for 24 hours after the procedure.  Some gas.  Mild abdominal cramping or bloating.  Follow these instructions at home: General instructions   For the first 24 hours after the procedure: ? Do not drive or use machinery. ? Do not sign important documents. ? Do not drink alcohol. ? Do your regular daily activities at a slower pace than normal. ? Eat soft, easy-to-digest foods. ? Rest often.  Take over-the-counter or prescription medicines only as told by your health care provider.  It is up to you to get the results of your procedure. Ask your health care provider, or the department performing the procedure, when your results will be ready. Relieving cramping and bloating  Try walking around when you have cramps or feel bloated.  Apply heat to your abdomen as told by your health care provider. Use a heat source that your health care  provider recommends, such as a moist heat pack or a heating pad. ? Place a towel between your skin and the heat source. ? Leave the heat on for 20-30 minutes. ? Remove the heat if your skin turns bright red. This is especially important if you are unable to feel pain, heat, or cold. You may have a greater risk of getting burned. Eating and drinking  Drink enough fluid to keep your urine clear or pale yellow.  Resume your normal diet as instructed by your health care provider. Avoid heavy or fried foods that are hard to digest.  Avoid drinking alcohol for as long as instructed by your health care provider. Contact a health care provider if:  You have blood in your stool 2-3 days after the procedure. Get help right away if:  You have more than a small spotting of blood in your stool.  You pass large blood clots in your stool.  Your abdomen is swollen.  You have nausea or vomiting.  You have a fever.  You have increasing abdominal pain that is not relieved with medicine. This information is not intended to replace advice given to you by your health care provider. Make sure you discuss any questions you have with your health care provider. Document Released: 07/10/2004 Document Revised: 08/20/2016 Document Reviewed: 02/07/2016 Elsevier Interactive  Patient Education  [employer].

## 2020-11-18 ENCOUNTER — Encounter (HOSPITAL_COMMUNITY)
Admission: RE | Admit: 2020-11-18 | Discharge: 2020-11-18 | Disposition: A | Discharge status: Home / Self Care | Payer: Medicare Other | Source: Ambulatory Visit | Attending: Internal Medicine | Admitting: Internal Medicine

## 2020-11-18 ENCOUNTER — Encounter (HOSPITAL_COMMUNITY): Payer: Self-pay

## 2020-11-18 ENCOUNTER — Other Ambulatory Visit (HOSPITAL_COMMUNITY)
Admission: RE | Admit: 2020-11-18 | Discharge: 2020-11-18 | Disposition: A | Discharge status: Home / Self Care | Payer: Medicare Other | Source: Ambulatory Visit | Attending: Internal Medicine | Admitting: Internal Medicine

## 2020-11-18 ENCOUNTER — Other Ambulatory Visit: Payer: Self-pay

## 2020-11-18 DIAGNOSIS — Z20822 Contact with and (suspected) exposure to covid-19: Secondary | ICD-10-CM | POA: Diagnosis not present

## 2020-11-18 DIAGNOSIS — I10 Essential (primary) hypertension: Secondary | ICD-10-CM | POA: Insufficient documentation

## 2020-11-18 DIAGNOSIS — Z01818 Encounter for other preprocedural examination: Secondary | ICD-10-CM | POA: Diagnosis present

## 2020-11-18 LAB — BASIC METABOLIC PANEL
Anion gap: 10 (ref 5–15)
BUN: 10 mg/dL (ref 6–20)
CO2: 25 mmol/L (ref 22–32)
Calcium: 9.2 mg/dL (ref 8.9–10.3)
Chloride: 102 mmol/L (ref 98–111)
Creatinine, Ser: 0.82 mg/dL (ref 0.44–1.00)
GFR, Estimated: >60 mL/min (ref >60)
Glucose, Bld: 113 mg/dL — ABNORMAL HIGH (ref 70–99)
Potassium: 3.7 mmol/L (ref 3.5–5.1)
Sodium: 137 mmol/L (ref 135–145)

## 2020-11-18 LAB — SARS CORONAVIRUS 2 (TAT 6-24 HRS): SARS Coronavirus 2: NEGATIVE

## 2020-11-21 ENCOUNTER — Encounter (HOSPITAL_COMMUNITY): Payer: Self-pay | Admitting: Internal Medicine

## 2020-11-21 ENCOUNTER — Ambulatory Visit (HOSPITAL_COMMUNITY): Payer: Medicare Other | Admitting: Anesthesiology

## 2020-11-21 ENCOUNTER — Encounter (HOSPITAL_COMMUNITY)
Admission: RE | Disposition: A | Discharge status: Home / Self Care | Payer: Self-pay | Source: Home / Self Care | Attending: Internal Medicine

## 2020-11-21 ENCOUNTER — Ambulatory Visit (HOSPITAL_COMMUNITY)
Admission: RE | Admit: 2020-11-21 | Discharge: 2020-11-21 | Disposition: A | Discharge status: Home / Self Care | Payer: Medicare Other | Attending: Internal Medicine | Admitting: Internal Medicine

## 2020-11-21 DIAGNOSIS — K5730 Diverticulosis of large intestine without perforation or abscess without bleeding: Secondary | ICD-10-CM | POA: Insufficient documentation

## 2020-11-21 DIAGNOSIS — Z1211 Encounter for screening for malignant neoplasm of colon: Secondary | ICD-10-CM | POA: Diagnosis not present

## 2020-11-21 DIAGNOSIS — Z7951 Long term (current) use of inhaled steroids: Secondary | ICD-10-CM | POA: Insufficient documentation

## 2020-11-21 DIAGNOSIS — K641 Second degree hemorrhoids: Secondary | ICD-10-CM | POA: Diagnosis not present

## 2020-11-21 DIAGNOSIS — Z79899 Other long term (current) drug therapy: Secondary | ICD-10-CM | POA: Diagnosis not present

## 2020-11-21 DIAGNOSIS — K644 Residual hemorrhoidal skin tags: Secondary | ICD-10-CM | POA: Diagnosis not present

## 2020-11-21 HISTORY — PX: COLONOSCOPY WITH PROPOFOL: SHX5780

## 2020-11-21 SURGERY — COLONOSCOPY WITH PROPOFOL
Anesthesia: General

## 2020-11-21 MED ORDER — PROPOFOL 500 MG/50ML IV EMUL
INTRAVENOUS | Status: DC | PRN
  Administered 2020-11-21: 150 ug/kg/min via INTRAVENOUS

## 2020-11-21 MED ORDER — PROPOFOL 10 MG/ML IV BOLUS
INTRAVENOUS | Status: DC | PRN
  Administered 2020-11-21: 100 mg via INTRAVENOUS
  Administered 2020-11-21: 50 mg via INTRAVENOUS

## 2020-11-21 MED ORDER — LIDOCAINE HCL (CARDIAC) PF 100 MG/5ML IV SOSY
PREFILLED_SYRINGE | INTRAVENOUS | Status: DC | PRN
  Administered 2020-11-21: 50 mg via INTRAVENOUS

## 2020-11-21 MED ORDER — LACTATED RINGERS IV SOLN: INTRAVENOUS | Status: DC | PRN

## 2020-11-21 MED ORDER — CHLORHEXIDINE GLUCONATE CLOTH 2 % EX PADS: 6.0000 | MEDICATED_PAD | Freq: Once | CUTANEOUS | Status: DC

## 2020-11-21 MED ORDER — STERILE WATER FOR IRRIGATION IR SOLN
Status: DC | PRN
  Administered 2020-11-21: 11:00:00 500 mL

## 2020-11-21 MED ORDER — LACTATED RINGERS IV SOLN: INTRAVENOUS | Status: DC

## 2020-11-21 NOTE — H&P (Addendum)
@LOGO @   Primary Care Physician:  , MD Primary Gastroenterologist:  Dr. Gareth Morgan  Pre-Procedure History & Physical: HPI:  Annette Bryant is a 59 y.o. female is here for a screening colonoscopy.  First ever average screening examination.  Past Medical History:  Diagnosis Date   Anxiety    Arthritis    Asthma    Carpal tunnel syndrome    COPD (chronic obstructive pulmonary disease) (HCC)    Depression    Dyspnea    a. Adm 12/2013 for dyspnea/palps: normal stress-pics-only Lexiscan nuc, echo unremarkable with normal EF, d-dimer normal, ruled out for MI.   Elevated lipids    HLD (hyperlipidemia)    Hyperglycemia    Hypertension    Morbid obesity (HCC)    Panic attacks    Sleep apnea    uses CPAP    Past Surgical History:  Procedure Laterality Date   CESAREAN SECTION     CHOLECYSTECTOMY N/A 08/01/2017   Procedure: LAPAROSCOPIC CHOLECYSTECTOMY;  Surgeon: 08/03/2017, MD;  Location: [facility];  Service: General;  Laterality: N/A;   TUBAL LIGATION      Prior to Admission medications   Medication Sig Start Date End Date Taking? Authorizing Provider  albuterol (PROVENTIL) (2.5 MG/3ML) 0.083% nebulizer solution Take 2.5 mg by nebulization every 6 (six) hours as needed for wheezing or shortness of breath.    Yes [provider]  ALPRAZolam Manus Rudd) 1 MG tablet Take 1 tablet (1 mg total) by mouth 2 (two) times daily. 10/29/17  Yes 10/31/17, MD  atorvastatin (LIPITOR) 40 MG tablet Take 40 mg by mouth at bedtime.  03/27/17  Yes [provider]  budesonide-formoterol (SYMBICORT) 160-4.5 MCG/ACT inhaler Inhale 2 puffs into the lungs 2 (two) times daily.   Yes [provider]  cholecalciferol (VITAMIN D3) 25 MCG (1000 UNIT) tablet Take 1,000 Units by mouth daily.   Yes [provider]  furosemide (LASIX) 40 MG tablet Take 40 mg by mouth daily as needed for fluid or edema.    Yes [provider]   HYDROcodone-acetaminophen (NORCO) 7.5-325 MG tablet Take 0.5-1 tablets by mouth 3 (three) times daily as needed for severe pain.  10/12/17  Yes [provider]  KLOR-CON M20 20 MEQ tablet Take 20 mEq by mouth daily. 08/21/20  Yes [provider]  Multiple Vitamin (MULTIVITAMIN WITH MINERALS) TABS tablet Take 1 tablet by mouth daily.   Yes [provider]  naproxen (NAPROSYN) 500 MG tablet Take 500 mg by mouth 2 (two) times daily as needed for moderate pain. As needed   Yes [provider]  PROAIR HFA 108 (90 BASE) MCG/ACT inhaler Inhale 1-2 puffs into the lungs every 6 (six) hours as needed for wheezing or shortness of breath. 06/27/15  Yes [provider]    Allergies as of 09/02/2020   (No Known Allergies)    Family History  Problem Relation Age of Onset   Coronary artery disease Sister 79       stents   Depression Sister    Coronary artery disease Sister    Heart attack Sister 67       CHF, MI   Schizophrenia Brother    Alcohol abuse Brother    Depression Mother    Alcohol abuse Father    Colon cancer Neg Hx     Social History   Socioeconomic History   Marital status: Divorced    Spouse name: Not on file   Number of children: Not  on file   Years of education: Not on file   Highest education level: Not on file  Occupational History   Not on file  Tobacco Use   Smoking status: Never Smoker   Smokeless tobacco: Never Used  Vaping Use   Vaping Use: Never used  Substance and Sexual Activity   Alcohol use: No   Drug use: No   Sexual activity: Not Currently    Birth control/protection: Surgical  Other Topics Concern   Not on file  Social History Narrative   Not on file   Social Determinants of Health   Financial Resource Strain: Not on file  Food Insecurity: Not on file  Transportation Needs: Not on file  Physical Activity: Not on file  Stress: Not on file  Social Connections: Not on file   Intimate Partner Violence: Not on file    Review of Systems: See HPI, otherwise negative ROS  Physical Exam: BP 126/69    Pulse 88    Temp 97.6 F (36.4 C) (Oral)    Resp 18    LMP 07/11/2017 (Exact Date)    SpO2 95%  General:   Alert,  Well-developed, well-nourished, pleasant and cooperative in NAD Neck:  Supple; no masses or thyromegaly. Lungs:  Clear throughout to auscultation.   No wheezes, crackles, or rhonchi. No acute distress. Heart:  Regular rate and rhythm; no murmurs, clicks, rubs,  or gallops. Abdomen:  Soft, nontender and nondistended. No masses, hepatosplenomegaly or hernias noted. Normal bowel sounds, without guarding, and without rebound.   Impression/Plan: Annette Bryant is now here to undergo a screening colonoscopy.  First ever average risk screening examination  Risks, benefits, limitations, imponderables and alternatives regarding colonoscopy have been reviewed with the patient. Questions have been answered. All parties agreeable.     Notice:  This dictation was prepared with Dragon dictation along with smaller phrase technology. Any transcriptional errors that result from this process are unintentional and may not be corrected upon review.

## 2020-11-21 NOTE — Transfer of Care (Signed)
Immediate Anesthesia Transfer of Care Note  Patient: Annette Bryant  Procedure(s) Performed: COLONOSCOPY WITH PROPOFOL (N/A )  Patient Location: PACU  Anesthesia Type:General  Level of Consciousness: drowsy  Airway & Oxygen Therapy: Patient Spontanous Breathing  Post-op Assessment: Report given to RN and Post -op Vital signs reviewed and stable  Post vital signs: Reviewed and stable  Last Vitals:  Vitals Value Taken Time  BP 112/98 11/21/20 1112  Temp    Pulse 103 11/21/20 1114  Resp 17 11/21/20 1114  SpO2 96 % 11/21/20 1114  Vitals shown include unvalidated device data.  Last Pain:  Vitals:   11/21/20 0952  TempSrc: Oral  PainSc: 0-No pain      Patients Stated Pain Goal: 5 (11/21/20 0518)  Complications: No complications documented.

## 2020-11-21 NOTE — Anesthesia Postprocedure Evaluation (Signed)
Anesthesia Post Note  Patient: Annette Bryant  Procedure(s) Performed: COLONOSCOPY WITH PROPOFOL (N/A )  Patient location during evaluation: Phase II Anesthesia Type: General Level of consciousness: awake Pain management: pain level controlled Vital Signs Assessment: post-procedure vital signs reviewed and stable Respiratory status: spontaneous breathing Cardiovascular status: blood pressure returned to baseline Anesthetic complications: no   No complications documented.   Last Vitals:  Vitals:   11/21/20 1130 11/21/20 1148  BP: 117/67 (!) 134/91  Pulse: 91 90  Resp: 19 18  Temp:  36.8 C  SpO2: 98% 97%    Last Pain:  Vitals:   11/21/20 1148  TempSrc: Oral  PainSc: 0-No pain                 Windell Norfolk

## 2020-11-21 NOTE — Op Note (Signed)
[facility] Patient Name: Annette Bryant Procedure Date: 11/21/2020 10:36 AM MRN: [id number] Date of Birth: [birth year] Attending MD: Gennette Pac , MD CSN: [id number] Age: 59 Admit Type: Outpatient Procedure:                Colonoscopy Indications:              Screening for colorectal malignant neoplasm Providers:                Gennette Pac, MD, Nena Polio, RN, Dyann Ruddle Referring MD:              Medicines:                Propofol per Anesthesia Complications:            No immediate complications. Estimated Blood Loss:     Estimated blood loss: none. Procedure:                Pre-Anesthesia Assessment:                           - Prior to the procedure, a History and Physical                            was performed, and patient medications and                            allergies were reviewed. The patient's tolerance of                            previous anesthesia was also reviewed. The risks                            and benefits of the procedure and the sedation                            options and risks were discussed with the patient.                            All questions were answered, and informed consent                            was obtained. Prior Anticoagulants: The patient has                            taken no previous anticoagulant or antiplatelet                            agents. ASA Grade Assessment: II - A patient with                            mild systemic disease. After reviewing the risks  and benefits, the patient was deemed in                            satisfactory condition to undergo the procedure.                           After obtaining informed consent, the colonoscope                            was passed under direct vision. Throughout the                            procedure, the patient's blood pressure, pulse, and                            oxygen  saturations were monitored continuously. The                            [id number]-[id number] ([id number]) scope was introduced through                            the anus and advanced to the the cecum, identified                            by appendiceal orifice and ileocecal valve. The                            colonoscopy was performed without difficulty. The                            patient tolerated the procedure well. The quality                            of the bowel preparation was adequate. Scope In: 10:55:12 AM Scope Out: 11:07:57 AM Scope Withdrawal Time: 0 hours 9 minutes 15 seconds  Total Procedure Duration: 0 hours 12 minutes 45 seconds  Findings:      The perianal and digital rectal examinations were normal.      Scattered medium-mouthed diverticula were found in the entire colon.      Non-bleeding external and internal hemorrhoids were found during       retroflexion. The hemorrhoids were moderate, medium-sized and Grade II       (internal hemorrhoids that prolapse but reduce spontaneously).      The exam was otherwise without abnormality on direct and retroflexion       views. Impression:               - Diverticulosis in the entire examined colon.                           - Non-bleeding external and internal hemorrhoids.                           - The examination was otherwise normal on direct  and retroflexion views.                           - No specimens collected. Moderate Sedation:      Moderate (conscious) sedation was personally administered by an       anesthesia professional. The following parameters were monitored: oxygen       saturation, heart rate, blood pressure, respiratory rate, EKG, adequacy       of pulmonary ventilation, and response to care. Recommendation:           - Patient has a contact number available for                            emergencies. The signs and symptoms of potential                            delayed  complications were discussed with the                            patient. Return to normal activities tomorrow.                            Written discharge instructions were provided to the                            patient.                           - Advance diet as tolerated.                           - Continue present medications.                           - Repeat colonoscopy in 10 years for screening                            purposes.                           - Return to GI office (date not yet determined). Procedure Code(s):        --- Professional ---                           [id number], Colonoscopy, flexible; diagnostic, including                            collection of specimen(s) by brushing or washing,                            when performed (separate procedure) Diagnosis Code(s):        --- Professional ---                           Z12.11, Encounter for screening for malignant  neoplasm of colon                           K64.1, Second degree hemorrhoids                           K57.30, Diverticulosis of large intestine without                            perforation or abscess without bleeding CPT copyright 2019 [employer]. All rights reserved. The codes documented in this report are preliminary and upon coder review may  be revised to meet current compliance requirements. Gerrit Friendsobert M. Mae Cianci, MD Gennette Pacobert Michael Makyla Bye, MD 11/21/2020 11:16:03 AM This report has been signed electronically. Number of Addenda: 0

## 2020-11-21 NOTE — Anesthesia Preprocedure Evaluation (Signed)
Anesthesia Evaluation  Patient identified by MRN, date of birth, ID band Patient awake    Reviewed: Allergy & Precautions, H&P , NPO status , Patient's Chart, lab work & pertinent test results, reviewed documented beta blocker date and time   Airway Mallampati: II  TM Distance: >3 FB Neck ROM: full    Dental no notable dental hx.    Pulmonary asthma , sleep apnea and Continuous Positive Airway Pressure Ventilation , COPD,    Pulmonary exam normal breath sounds clear to auscultation       Cardiovascular Exercise Tolerance: Good hypertension, negative cardio ROS   Rhythm:regular Rate:Normal     Neuro/Psych PSYCHIATRIC DISORDERS Anxiety Depression  Neuromuscular disease    GI/Hepatic negative GI ROS, Neg liver ROS,   Endo/Other  Morbid obesity  Renal/GU negative Renal ROS  negative genitourinary   Musculoskeletal   Abdominal   Peds  Hematology negative hematology ROS (+)   Anesthesia Other Findings   Reproductive/Obstetrics negative OB ROS                             Anesthesia Physical Anesthesia Plan  ASA: III  Anesthesia Plan: General   Post-op Pain Management:    Induction:   PONV Risk Score and Plan: Propofol infusion  Airway Management Planned:   Additional Equipment:   Intra-op Plan:   Post-operative Plan:   Informed Consent: I have reviewed the patients History and Physical, chart, labs and discussed the procedure including the risks, benefits and alternatives for the proposed anesthesia with the patient or authorized representative who has indicated his/her understanding and acceptance.     Dental Advisory Given  Plan Discussed with: CRNA  Anesthesia Plan Comments:         Anesthesia Quick Evaluation

## 2020-11-21 NOTE — Anesthesia Procedure Notes (Signed)
Date/Time: 11/21/2020 10:56 AM Performed by: Julian Reil, CRNA Pre-anesthesia Checklist: Patient identified, Emergency Drugs available, Suction available and Patient being monitored Patient Re-evaluated:Patient Re-evaluated prior to induction Oxygen Delivery Method: Non-rebreather mask Induction Type: IV induction Placement Confirmation: positive ETCO2

## 2020-11-21 NOTE — Discharge Instructions (Signed)
Colonoscopy Discharge Instructions  Read the instructions outlined below and refer to this sheet in the next few weeks. These discharge instructions provide you with general information on caring for yourself after you leave the hospital. Your doctor may also give you specific instructions. While your treatment has been planned according to the most current medical practices available, unavoidable complications occasionally occur. If you have any problems or questions after discharge, call Dr. Jena Gaussourk at [phone]. ACTIVITY  You may resume your regular activity, but move at a slower pace for the next 24 hours.   Take frequent rest periods for the next 24 hours.   Walking will help get rid of the air and reduce the bloated feeling in your belly (abdomen).   No driving for 24 hours (because of the medicine (anesthesia) used during the test).    Do not sign any important legal documents or operate any machinery for 24 hours (because of the anesthesia used during the test).  NUTRITION  Drink plenty of fluids.   You may resume your normal diet as instructed by your doctor.   Begin with a light meal and progress to your normal diet. Heavy or fried foods are harder to digest and may make you feel sick to your stomach (nauseated).   Avoid alcoholic beverages for 24 hours or as instructed.  MEDICATIONS  You may resume your normal medications unless your doctor tells you otherwise.  WHAT YOU CAN EXPECT TODAY  Some feelings of bloating in the abdomen.   Passage of more gas than usual.   Spotting of blood in your stool or on the toilet paper.  IF YOU HAD POLYPS REMOVED DURING THE COLONOSCOPY:  No aspirin products for 7 days or as instructed.   No alcohol for 7 days or as instructed.   Eat a soft diet for the next 24 hours.  FINDING OUT THE RESULTS OF YOUR TEST Not all test results are available during your visit. If your test results are not back during the visit, make an appointment  with your caregiver to find out the results. Do not assume everything is normal if you have not heard from your caregiver or the medical facility. It is important for you to follow up on all of your test results.  SEEK IMMEDIATE MEDICAL ATTENTION IF:  You have more than a spotting of blood in your stool.   Your belly is swollen (abdominal distention).   You are nauseated or vomiting.   You have a temperature over 101.   You have abdominal pain or discomfort that is severe or gets worse throughout the day.   Diverticulosis and hemorrhoids found today  No polyps.  I recommend a repeat screening examination in 10 years  At patient request, I called Daquan at [phone] -reviewed results      Diverticulosis  Diverticulosis is a condition that develops when small pouches (diverticula) form in the wall of the large intestine (colon). The colon is where water is absorbed and stool (feces) is formed. The pouches form when the inside layer of the colon pushes through weak spots in the outer layers of the colon. You may have a few pouches or many of them. The pouches usually do not cause problems unless they become inflamed or infected. When this happens, the condition is called diverticulitis. What are the causes? The cause of this condition is not known. What increases the risk? The following factors may make you more likely to develop this condition:  Being older than  age 67. Your risk for this condition increases with age. Diverticulosis is rare among people younger than age 58. By age 31, many people have it.  Eating a low-fiber diet.  Having frequent constipation.  Being overweight.  Not getting enough exercise.  Smoking.  Taking over-the-counter pain medicines, like aspirin and ibuprofen.  Having a family history of diverticulosis. What are the signs or symptoms? In most people, there are no symptoms of this condition. If you do have symptoms, they may  include:  Bloating.  Cramps in the abdomen.  Constipation or diarrhea.  Pain in the lower left side of the abdomen. How is this diagnosed? Because diverticulosis usually has no symptoms, it is most often diagnosed during an exam for other colon problems. The condition may be diagnosed by:  Using a flexible scope to examine the colon (colonoscopy).  Taking an X-ray of the colon after dye has been put into the colon (barium enema).  Having a CT scan. How is this treated? You may not need treatment for this condition. Your health care provider may recommend treatment to prevent problems. You may need treatment if you have symptoms or if you previously had diverticulitis. Treatment may include:  Eating a high-fiber diet.  Taking a fiber supplement.  Taking a live bacteria supplement (probiotic).  Taking medicine to relax your colon. Follow these instructions at home: Medicines  Take over-the-counter and prescription medicines only as told by your health care provider.  If told by your health care provider, take a fiber supplement or probiotic. Constipation prevention Your condition may cause constipation. To prevent or treat constipation, you may need to:  Drink enough fluid to keep your urine pale yellow.  Take over-the-counter or prescription medicines.  Eat foods that are high in fiber, such as beans, whole grains, and fresh fruits and vegetables.  Limit foods that are high in fat and processed sugars, such as fried or sweet foods.  General instructions  Try not to strain when you have a bowel movement.  Keep all follow-up visits as told by your health care provider. This is important. Contact a health care provider if you:  Have pain in your abdomen.  Have bloating.  Have cramps.  Have not had a bowel movement in 3 days. Get help right away if:  Your pain gets worse.  Your bloating becomes very bad.  You have a fever or chills, and your symptoms  suddenly get worse.  You vomit.  You have bowel movements that are bloody or black.  You have bleeding from your rectum. Summary  Diverticulosis is a condition that develops when small pouches (diverticula) form in the wall of the large intestine (colon).  You may have a few pouches or many of them.  This condition is most often diagnosed during an exam for other colon problems.  Treatment may include increasing the fiber in your diet, taking supplements, or taking medicines. This information is not intended to replace advice given to you by your health care provider. Make sure you discuss any questions you have with your health care provider. Document Revised: 06/25/2019 Document Reviewed: 06/25/2019 Elsevier Patient Education  2020 [employer].   Hemorrhoids Hemorrhoids are swollen veins in and around the rectum or anus. There are two types of hemorrhoids:  Internal hemorrhoids. These occur in the veins that are just inside the rectum. They may poke through to the outside and become irritated and painful.  External hemorrhoids. These occur in the veins that are outside the  anus and can be felt as a painful swelling or hard lump near the anus. Most hemorrhoids do not cause serious problems, and they can be managed with home treatments such as diet and lifestyle changes. If home treatments do not help the symptoms, procedures can be done to shrink or remove the hemorrhoids. What are the causes? This condition is caused by increased pressure in the anal area. This pressure may result from various things, including:  Constipation.  Straining to have a bowel movement.  Diarrhea.  Pregnancy.  Obesity.  Sitting for long periods of time.  Heavy lifting or other activity that causes you to strain.  Anal sex.  Riding a bike for a long period of time. What are the signs or symptoms? Symptoms of this condition include:  Pain.  Anal itching or irritation.  Rectal  bleeding.  Leakage of stool (feces).  Anal swelling.  One or more lumps around the anus. How is this diagnosed? This condition can often be diagnosed through a visual exam. Other exams or tests may also be done, such as:  An exam that involves feeling the rectal area with a gloved hand (digital rectal exam).  An exam of the anal canal that is done using a small tube (anoscope).  A blood test, if you have lost a significant amount of blood.  A test to look inside the colon using a flexible tube with a camera on the end (sigmoidoscopy or colonoscopy). How is this treated? This condition can usually be treated at home. However, various procedures may be done if dietary changes, lifestyle changes, and other home treatments do not help your symptoms. These procedures can help make the hemorrhoids smaller or remove them completely. Some of these procedures involve surgery, and others do not. Common procedures include:  Rubber band ligation. Rubber bands are placed at the base of the hemorrhoids to cut off their blood supply.  Sclerotherapy. Medicine is injected into the hemorrhoids to shrink them.  Infrared coagulation. A type of light energy is used to get rid of the hemorrhoids.  Hemorrhoidectomy surgery. The hemorrhoids are surgically removed, and the veins that supply them are tied off.  Stapled hemorrhoidopexy surgery. The surgeon staples the base of the hemorrhoid to the rectal wall. Follow these instructions at home: Eating and drinking   Eat foods that have a lot of fiber in them, such as whole grains, beans, nuts, fruits, and vegetables.  Ask your health care provider about taking products that have added fiber (fiber supplements).  Reduce the amount of fat in your diet. You can do this by eating low-fat dairy products, eating less red meat, and avoiding processed foods.  Drink enough fluid to keep your urine pale yellow. Managing pain and swelling   Take warm sitz baths  for 20 minutes, 3-4 times a day to ease pain and discomfort. You may do this in a bathtub or using a portable sitz bath that fits over the toilet.  If directed, apply ice to the affected area. Using ice packs between sitz baths may be helpful. ? Put ice in a plastic bag. ? Place a towel between your skin and the bag. ? Leave the ice on for 20 minutes, 2-3 times a day. General instructions  Take over-the-counter and prescription medicines only as told by your health care provider.  Use medicated creams or suppositories as told.  Get regular exercise. Ask your health care provider how much and what kind of exercise is best for you. In  general, you should do moderate exercise for at least 30 minutes on most days of the week (150 minutes each week). This can include activities such as walking, biking, or yoga.  Go to the bathroom when you have the urge to have a bowel movement. Do not wait.  Avoid straining to have bowel movements.  Keep the anal area dry and clean. Use wet toilet paper or moist towelettes after a bowel movement.  Do not sit on the toilet for long periods of time. This increases blood pooling and pain.  Keep all follow-up visits as told by your health care provider. This is important. Contact a health care provider if you have:  Increasing pain and swelling that are not controlled by treatment or medicine.  Difficulty having a bowel movement, or you are unable to have a bowel movement.  Pain or inflammation outside the area of the hemorrhoids. Get help right away if you have:  Uncontrolled bleeding from your rectum. Summary  Hemorrhoids are swollen veins in and around the rectum or anus.  Most hemorrhoids can be managed with home treatments such as diet and lifestyle changes.  Taking warm sitz baths can help ease pain and discomfort.  In severe cases, procedures or surgery can be done to shrink or remove the hemorrhoids. This information is not intended to  replace advice given to you by your health care provider. Make sure you discuss any questions you have with your health care provider. Document Revised: 04/24/2019 Document Reviewed: 04/17/2018 Elsevier Patient Education  2020 Else[employer] Inc[employer]     Monitored Anesthesia Care, Care After These instructions provide you with information about caring for yourself after your procedure. Your health care provider may also give you more specific instructions. Your treatment has been planned according to current medical practices, but problems sometimes occur. Call your health care provider if you have any problems or questions after your procedure. What can I expect after the procedure? After your procedure, you may:  Feel sleepy for several hours.  Feel clumsy and have poor balance for several hours.  Feel forgetful about what happened after the procedure.  Have poor judgment for several hours.  Feel nauseous or vomit.  Have a sore throat if you had a breathing tube during the procedure. Follow these instructions at home: For at least 24 hours after the procedure:      Have a responsible adult stay with you. It is important to have someone help care for you until you are awake and alert.  Rest as needed.  Do not: ? Participate in activities in which you could fall or become injured. ? Drive. ? Use heavy machinery. ? Drink alcohol. ? Take sleeping pills or medicines that cause drowsiness. ? Make important decisions or sign legal documents. ? Take care of children on your own. Eating and drinking  Follow the diet that is recommended by your health care provider.  If you vomit, drink water, juice, or soup when you can drink without vomiting.  Make sure you have little or no nausea before eating solid foods. General instructions  Take over-the-counter and prescription medicines only as told by your health care provider.  If you have sleep apnea, surgery and certain medicines can  increase your risk for breathing problems. Follow instructions from your health care provider about wearing your sleep device: ? Anytime you are sleeping, including during daytime naps. ? While taking prescription pain medicines, sleeping medicines, or medicines that make you drowsy.  If you smoke,  do not smoke without supervision.  Keep all follow-up visits as told by your health care provider. This is important. Contact a health care provider if:  You keep feeling nauseous or you keep vomiting.  You feel light-headed.  You develop a rash.  You have a fever. Get help right away if:  You have trouble breathing. Summary  For several hours after your procedure, you may feel sleepy and have poor judgment.  Have a responsible adult stay with you for at least 24 hours or until you are awake and alert. This information is not intended to replace advice given to you by your health care provider. Make sure you discuss any questions you have with your health care provider. Document Revised: 02/24/2018 Document Reviewed: 03/18/2016 Elsevier Patient Education  2020 [employer].

## 2020-11-28 ENCOUNTER — Encounter (HOSPITAL_COMMUNITY): Payer: Self-pay | Admitting: Internal Medicine

## 2021-06-21 ENCOUNTER — Other Ambulatory Visit: Payer: Self-pay

## 2021-06-21 ENCOUNTER — Emergency Department (HOSPITAL_COMMUNITY)
Admission: EM | Admit: 2021-06-21 | Discharge: 2021-06-21 | Disposition: A | Discharge status: Home / Self Care | Payer: Medicare Other | Attending: Emergency Medicine | Admitting: Emergency Medicine

## 2021-06-21 ENCOUNTER — Encounter (HOSPITAL_COMMUNITY): Payer: Self-pay | Admitting: Emergency Medicine

## 2021-06-21 DIAGNOSIS — Z7951 Long term (current) use of inhaled steroids: Secondary | ICD-10-CM | POA: Diagnosis not present

## 2021-06-21 DIAGNOSIS — J449 Chronic obstructive pulmonary disease, unspecified: Secondary | ICD-10-CM | POA: Diagnosis not present

## 2021-06-21 DIAGNOSIS — H6123 Impacted cerumen, bilateral: Secondary | ICD-10-CM | POA: Diagnosis not present

## 2021-06-21 DIAGNOSIS — H9203 Otalgia, bilateral: Secondary | ICD-10-CM | POA: Diagnosis present

## 2021-06-21 DIAGNOSIS — I10 Essential (primary) hypertension: Secondary | ICD-10-CM | POA: Insufficient documentation

## 2021-06-21 DIAGNOSIS — J45909 Unspecified asthma, uncomplicated: Secondary | ICD-10-CM | POA: Insufficient documentation

## 2021-06-21 MED ORDER — HYDROGEN PEROXIDE 3 % EX SOLN
Freq: Once | CUTANEOUS | Status: AC
  Filled 2021-06-21: qty 473

## 2021-06-21 NOTE — ED Triage Notes (Signed)
Pt states both ears feel like they are "clogged up." Left is worse than right and says she cant hear.

## 2021-06-21 NOTE — ED Notes (Signed)
Irrigated ears with hydrogen peroxide/water solution. Large piece of impacted cerumen removed from left ear. Right ear was flushed resulting in small pieced of cerumen

## 2021-06-21 NOTE — ED Provider Notes (Signed)
[facility] EMERGENCY DEPARTMENT Provider Note   CSN: [id number] Arrival date & time: 06/21/21  0626     History No chief complaint on file.   Annette Bryant is a 60 y.o. female presenting for evaluation of bilateral ear pressure and reduced hearing acuity, starting yesterday.  She reports symptoms in both ears, but left is greater than right, unable to hear anything from this side.  She denies pain, drainage, fevers, chills, nasal congestion, sinus issues.  She has had no treatment prior to arrival.   The history is provided by the patient.      Past Medical History:  Diagnosis Date   Anxiety    Arthritis    Asthma    Carpal tunnel syndrome    COPD (chronic obstructive pulmonary disease) (HCC)    Depression    Dyspnea    a. Adm 12/2013 for dyspnea/palps: normal stress-pics-only Lexiscan nuc, echo unremarkable with normal EF, d-dimer normal, ruled out for MI.   Elevated lipids    HLD (hyperlipidemia)    Hyperglycemia    Hypertension    Morbid obesity (HCC)    Panic attacks    Sleep apnea    uses CPAP    Patient Active Problem List   Diagnosis Date Noted   Colon cancer screening 09/02/2020   Taking multiple medications for chronic disease 07/10/2018   Right hand pain 01/19/2016   Left hand pain 01/19/2016   Bilateral hand pain 01/19/2016   Morbid obesity (HCC)    Elevated lipids    Hyperglycemia    Hypertension    Depression    Sleep apnea    Dyspnea 12/11/2013   Heart palpitations 12/11/2013   Dizziness 12/11/2013    Past Surgical History:  Procedure Laterality Date   CESAREAN SECTION     CHOLECYSTECTOMY N/A 08/01/2017   Procedure: LAPAROSCOPIC CHOLECYSTECTOMY;  Surgeon: Manus Rudd, MD;  Location: [facility];  Service: General;  Laterality: N/A;   COLONOSCOPY WITH PROPOFOL N/A 11/21/2020   Procedure: COLONOSCOPY WITH PROPOFOL;  Surgeon: Corbin Ade, MD;  Location: [facility];  Service: Endoscopy;  Laterality: N/A;  12:00pm   TUBAL LIGATION        OB History   No obstetric history on file.     Family History  Problem Relation Age of Onset   Coronary artery disease Sister 43       stents   Depression Sister    Coronary artery disease Sister    Heart attack Sister 39       CHF, MI   Schizophrenia Brother    Alcohol abuse Brother    Depression Mother    Alcohol abuse Father    Colon cancer Neg Hx     Social History   Tobacco Use   Smoking status: Never   Smokeless tobacco: Never  Vaping Use   Vaping Use: Never used  Substance Use Topics   Alcohol use: No   Drug use: No    Home Medications Prior to Admission medications   Medication Sig Start Date End Date Taking? Authorizing Provider  albuterol (PROVENTIL) (2.5 MG/3ML) 0.083% nebulizer solution Take 2.5 mg by nebulization every 6 (six) hours as needed for wheezing or shortness of breath.     [provider]  ALPRAZolam Prudy Feeler) 1 MG tablet Take 1 tablet (1 mg total) by mouth 2 (two) times daily. 10/29/17   Myrlene Broker, MD  atorvastatin (LIPITOR) 40 MG tablet Take 40 mg by mouth at bedtime.  03/27/17  [provider]  budesonide-formoterol (SYMBICORT) 160-4.5 MCG/ACT inhaler Inhale 2 puffs into the lungs 2 (two) times daily.    [provider]  cholecalciferol (VITAMIN D3) 25 MCG (1000 UNIT) tablet Take 1,000 Units by mouth daily.    [provider]  furosemide (LASIX) 40 MG tablet Take 40 mg by mouth daily as needed for fluid or edema.     [provider]  HYDROcodone-acetaminophen (NORCO) 7.5-325 MG tablet Take 0.5-1 tablets by mouth 3 (three) times daily as needed for severe pain.  10/12/17   [provider]  KLOR-CON M20 20 MEQ tablet Take 20 mEq by mouth daily. 08/21/20   [provider]  Multiple Vitamin (MULTIVITAMIN WITH MINERALS) TABS tablet Take 1 tablet by mouth daily.    [provider]  naproxen (NAPROSYN) 500 MG tablet Take 500 mg by mouth 2 (two) times daily as needed for  moderate pain. As needed    [provider]  PROAIR HFA 108 (90 BASE) MCG/ACT inhaler Inhale 1-2 puffs into the lungs every 6 (six) hours as needed for wheezing or shortness of breath. 06/27/15   [provider]    Allergies    Patient has no known allergies.  Review of Systems   Review of Systems  Constitutional:  Negative for chills and fever.  HENT:  Positive for congestion and hearing loss. Negative for ear discharge, ear pain, rhinorrhea, sinus pressure, sore throat, trouble swallowing and voice change.   Eyes:  Negative for discharge.  Respiratory:  Negative for cough, shortness of breath, wheezing and stridor.   Cardiovascular:  Negative for chest pain.  Gastrointestinal:  Negative for abdominal pain.  Genitourinary: Negative.   All other systems reviewed and are negative.  Physical Exam Updated Vital Signs BP (!) 158/102 (BP Location: Left Arm)   Pulse 89   Temp 98 F (36.7 C) (Oral)   Resp 20   Ht 5\' 7"  (1.702 m)   Wt 117.9 kg   LMP 07/11/2017 (Exact Date)   SpO2 97%   BMI 40.72 kg/m   Physical Exam Constitutional:      Appearance: She is well-developed.  HENT:     Head: Normocephalic and atraumatic.     Ears:     Comments: Unable to visualize either TM, large amount of impacted cerumen bilaterally.     Nose: No mucosal edema, congestion or rhinorrhea.     Mouth/Throat:     Pharynx: Uvula midline. No oropharyngeal exudate or posterior oropharyngeal erythema.     Tonsils: No tonsillar abscesses.  Eyes:     Conjunctiva/sclera: Conjunctivae normal.  Cardiovascular:     Rate and Rhythm: Normal rate.  Pulmonary:     Effort: Pulmonary effort is normal. No respiratory distress.     Breath sounds: No wheezing or rales.  Musculoskeletal:        General: Normal range of motion.  Skin:    General: Skin is warm and dry.     Findings: No rash.  Neurological:     Mental Status: She is alert and oriented to person, place, and time.    ED Results /  Procedures / Treatments   Labs (all labs ordered are listed, but only abnormal results are displayed) Labs Reviewed - No data to display  EKG None  Radiology No results found.  Procedures .Ear Cerumen Removal  Date/Time: 06/21/2021 9:39 AM Performed by: 06/23/2021, PA-C Authorized by: Burgess Amor, PA-C   Consent:    Consent obtained:  Verbal  Consent given by:  Patient   Risks, benefits, and alternatives were discussed: yes     Risks discussed:  Bleeding, pain, TM perforation, incomplete removal and dizziness   Alternatives discussed:  Delayed treatment and alternative treatment Universal protocol:    Patient identity confirmed:  Verbally with patient Procedure details:    Location:  L ear   Procedure type: curette     Procedure outcomes: cerumen removed   Post-procedure details:    Inspection:  Ear canal clear   Hearing quality:  Normal   Procedure completion:  Tolerated well, no immediate complications Comments:     Partially removed cerumen left ear using ear curette, very hard wax plug still present bilateral deep ear canals.  H2O2 soaks employed prior to flushing with warm water.      Medications Ordered in ED Medications  hydrogen peroxide 3 % external solution ( Topical Given 06/21/21 1040)    ED Course  I have reviewed the triage vital signs and the nursing notes.  Pertinent labs & imaging results that were available during my care of the patient were reviewed by me and considered in my medical decision making (see chart for details).    MDM Rules/Calculators/A&P                          Bilateral cerumen impaction.  Final Clinical Impression(s) / ED Diagnoses Final diagnoses:  Bilateral impacted cerumen    Rx / DC Orders ED Discharge Orders     None        Victoriano Lain 06/21/21 1058    Cathren Laine, MD 06/24/21 1540

## 2021-07-28 ENCOUNTER — Other Ambulatory Visit: Payer: Self-pay

## 2021-07-28 ENCOUNTER — Inpatient Hospital Stay (HOSPITAL_COMMUNITY)
Admission: EM | Admit: 2021-07-28 | Discharge: 2021-08-01 | DRG: 246 | Disposition: A | Discharge status: Home / Self Care | Payer: Medicare Other | Attending: Cardiovascular Disease | Admitting: Cardiovascular Disease

## 2021-07-28 ENCOUNTER — Encounter (HOSPITAL_COMMUNITY): Payer: Self-pay

## 2021-07-28 DIAGNOSIS — Z6841 Body Mass Index (BMI) 40.0 and over, adult: Secondary | ICD-10-CM

## 2021-07-28 DIAGNOSIS — Z79890 Hormone replacement therapy (postmenopausal): Secondary | ICD-10-CM

## 2021-07-28 DIAGNOSIS — I10 Essential (primary) hypertension: Secondary | ICD-10-CM | POA: Diagnosis present

## 2021-07-28 DIAGNOSIS — J449 Chronic obstructive pulmonary disease, unspecified: Secondary | ICD-10-CM | POA: Diagnosis present

## 2021-07-28 DIAGNOSIS — M1990 Unspecified osteoarthritis, unspecified site: Secondary | ICD-10-CM | POA: Diagnosis present

## 2021-07-28 DIAGNOSIS — I5041 Acute combined systolic (congestive) and diastolic (congestive) heart failure: Secondary | ICD-10-CM

## 2021-07-28 DIAGNOSIS — R197 Diarrhea, unspecified: Secondary | ICD-10-CM | POA: Diagnosis present

## 2021-07-28 DIAGNOSIS — I252 Old myocardial infarction: Secondary | ICD-10-CM

## 2021-07-28 DIAGNOSIS — I25118 Atherosclerotic heart disease of native coronary artery with other forms of angina pectoris: Secondary | ICD-10-CM | POA: Diagnosis present

## 2021-07-28 DIAGNOSIS — I214 Non-ST elevation (NSTEMI) myocardial infarction: Principal | ICD-10-CM | POA: Diagnosis present

## 2021-07-28 DIAGNOSIS — Z811 Family history of alcohol abuse and dependence: Secondary | ICD-10-CM

## 2021-07-28 DIAGNOSIS — I2510 Atherosclerotic heart disease of native coronary artery without angina pectoris: Secondary | ICD-10-CM

## 2021-07-28 DIAGNOSIS — Z20822 Contact with and (suspected) exposure to covid-19: Secondary | ICD-10-CM | POA: Diagnosis present

## 2021-07-28 DIAGNOSIS — G4733 Obstructive sleep apnea (adult) (pediatric): Secondary | ICD-10-CM

## 2021-07-28 DIAGNOSIS — E119 Type 2 diabetes mellitus without complications: Secondary | ICD-10-CM | POA: Diagnosis present

## 2021-07-28 DIAGNOSIS — Z818 Family history of other mental and behavioral disorders: Secondary | ICD-10-CM

## 2021-07-28 DIAGNOSIS — I255 Ischemic cardiomyopathy: Secondary | ICD-10-CM

## 2021-07-28 DIAGNOSIS — Z955 Presence of coronary angioplasty implant and graft: Secondary | ICD-10-CM

## 2021-07-28 DIAGNOSIS — I110 Hypertensive heart disease with heart failure: Secondary | ICD-10-CM | POA: Diagnosis present

## 2021-07-28 DIAGNOSIS — R112 Nausea with vomiting, unspecified: Secondary | ICD-10-CM | POA: Diagnosis present

## 2021-07-28 DIAGNOSIS — Z635 Disruption of family by separation and divorce: Secondary | ICD-10-CM

## 2021-07-28 DIAGNOSIS — E782 Mixed hyperlipidemia: Secondary | ICD-10-CM | POA: Diagnosis present

## 2021-07-28 DIAGNOSIS — F410 Panic disorder [episodic paroxysmal anxiety] without agoraphobia: Secondary | ICD-10-CM | POA: Diagnosis present

## 2021-07-28 DIAGNOSIS — Z8249 Family history of ischemic heart disease and other diseases of the circulatory system: Secondary | ICD-10-CM

## 2021-07-28 DIAGNOSIS — F32A Depression, unspecified: Secondary | ICD-10-CM | POA: Diagnosis present

## 2021-07-28 DIAGNOSIS — Z79899 Other long term (current) drug therapy: Secondary | ICD-10-CM

## 2021-07-28 DIAGNOSIS — E785 Hyperlipidemia, unspecified: Secondary | ICD-10-CM

## 2021-07-28 DIAGNOSIS — I2584 Coronary atherosclerosis due to calcified coronary lesion: Secondary | ICD-10-CM | POA: Diagnosis present

## 2021-07-28 LAB — CBC WITH DIFFERENTIAL/PLATELET
Abs Immature Granulocytes: 0.08 10*3/uL — ABNORMAL HIGH (ref 0.00–0.07)
Basophils Absolute: 0 10*3/uL (ref 0.0–0.1)
Basophils Relative: 0 %
Eosinophils Absolute: 0 10*3/uL (ref 0.0–0.5)
Eosinophils Relative: 0 %
HCT: 47.1 % — ABNORMAL HIGH (ref 36.0–46.0)
Hemoglobin: 14.9 g/dL (ref 12.0–15.0)
Immature Granulocytes: 1 %
Lymphocytes Relative: 12 %
Lymphs Abs: 1.7 10*3/uL (ref 0.7–4.0)
MCH: 26.8 pg (ref 26.0–34.0)
MCHC: 31.6 g/dL (ref 30.0–36.0)
MCV: 84.7 fL (ref 80.0–100.0)
Monocytes Absolute: 0.7 10*3/uL (ref 0.1–1.0)
Monocytes Relative: 5 %
Neutro Abs: 11.6 10*3/uL — ABNORMAL HIGH (ref 1.7–7.7)
Neutrophils Relative %: 82 %
Platelets: 303 10*3/uL (ref 150–400)
RBC: 5.56 MIL/uL — ABNORMAL HIGH (ref 3.87–5.11)
RDW: 15.9 % — ABNORMAL HIGH (ref 11.5–15.5)
WBC: 14.1 10*3/uL — ABNORMAL HIGH (ref 4.0–10.5)
nRBC: 0 % (ref 0.0–0.2)

## 2021-07-28 MED ORDER — SODIUM CHLORIDE 0.9 % IV BOLUS
500.0000 mL | Freq: Once | INTRAVENOUS | Status: AC
  Administered 2021-07-28: 500 mL via INTRAVENOUS

## 2021-07-28 MED ORDER — ONDANSETRON HCL 4 MG/2ML IJ SOLN
4.0000 mg | Freq: Once | INTRAMUSCULAR | Status: AC
  Administered 2021-07-28: 4 mg via INTRAVENOUS
  Filled 2021-07-28: qty 2

## 2021-07-28 NOTE — ED Triage Notes (Signed)
Pt reports making a bad batch of cookies at home yesterday and has been having multiple episodes of emesis and diarrhea since last night.

## 2021-07-28 NOTE — ED Provider Notes (Signed)
Emergency Department Provider Note   I have reviewed the triage vital signs and the nursing notes.   HISTORY  Chief Complaint Emesis   HPI Annette Bryant is a 60 y.o. female with a past medical history reviewed below presents to the emergency department with vomiting and diarrhea over the past 2 days.  Patient states symptoms began after she ate an expired batch of cookie dough from the grocery store.  She for cookies and very shortly afterwards developed nausea, vomiting, diarrhea.  Symptoms have been persistent.  She is not having abdominal pain, chest pain, shortness of breath, fevers.  No UTI symptoms.  Her vomit and stool have been nonbloody.  Her symptoms are improving slightly but still has difficulty with eating and drinking which prompted her ED visit.   Past Medical History:  Diagnosis Date   Anxiety    Arthritis    Asthma    Carpal tunnel syndrome    COPD (chronic obstructive pulmonary disease) (HCC)    Depression    Dyspnea    a. Adm 12/2013 for dyspnea/palps: normal stress-pics-only Lexiscan nuc, echo unremarkable with normal EF, d-dimer normal, ruled out for MI.   Elevated lipids    HLD (hyperlipidemia)    Hyperglycemia    Hypertension    Morbid obesity (HCC)    Panic attacks    Sleep apnea    uses CPAP    Patient Active Problem List   Diagnosis Date Noted   NSTEMI (non-ST elevated myocardial infarction) (HCC) 07/29/2021   Colon cancer screening 09/02/2020   Taking multiple medications for chronic disease 07/10/2018   Right hand pain 01/19/2016   Left hand pain 01/19/2016   Bilateral hand pain 01/19/2016   Morbid obesity (HCC)    Elevated lipids    Hyperglycemia    Hypertension    Depression    Sleep apnea    Dyspnea 12/11/2013   Heart palpitations 12/11/2013   Dizziness 12/11/2013    Past Surgical History:  Procedure Laterality Date   CESAREAN SECTION     CHOLECYSTECTOMY N/A 08/01/2017   Procedure: LAPAROSCOPIC CHOLECYSTECTOMY;  Surgeon:  Manus Rudd, MD;  Location: [facility];  Service: General;  Laterality: N/A;   COLONOSCOPY WITH PROPOFOL N/A 11/21/2020   Procedure: COLONOSCOPY WITH PROPOFOL;  Surgeon: Corbin Ade, MD;  Location: [facility];  Service: Endoscopy;  Laterality: N/A;  12:00pm   TUBAL LIGATION      Allergies Patient has no known allergies.  Family History  Problem Relation Age of Onset   Coronary artery disease Sister 17       stents   Depression Sister    Coronary artery disease Sister    Heart attack Sister 51       CHF, MI   Schizophrenia Brother    Alcohol abuse Brother    Depression Mother    Alcohol abuse Father    Colon cancer Neg Hx     Social History Social History   Tobacco Use   Smoking status: Never   Smokeless tobacco: Never  Vaping Use   Vaping Use: Never used  Substance Use Topics   Alcohol use: No   Drug use: No    Review of Systems  Constitutional: No fever/chills Eyes: No visual changes. ENT: No sore throat. Cardiovascular: Denies chest pain. Respiratory: Denies shortness of breath. Gastrointestinal: No abdominal pain. Positive nausea, vomiting, and diarrhea.  No constipation. Genitourinary: Negative for dysuria. Musculoskeletal: Negative for back pain. Skin: Negative for rash. Neurological: Negative for  headaches, focal weakness or numbness.  10-point ROS otherwise negative.  ____________________________________________   PHYSICAL EXAM:  VITAL SIGNS: ED Triage Vitals  Enc Vitals Group     BP 07/28/21 2009 (!) 149/97     Pulse Rate 07/28/21 2009 88     Resp 07/28/21 2009 18     Temp 07/28/21 2009 99.8 F (37.7 C)     Temp Source 07/28/21 2009 Oral     SpO2 07/28/21 2009 96 %     Weight 07/28/21 2010 269 lb (122 kg)     Height 07/28/21 2010 5\' 7"  (1.702 m)   Constitutional: Alert and oriented. Well appearing and in no acute distress. Eyes: Conjunctivae are normal.  Head: Atraumatic. Nose: No congestion/rhinnorhea. Mouth/Throat: Mucous  membranes are moist.  Neck: No stridor.   Cardiovascular: Normal rate, regular rhythm. Good peripheral circulation. Grossly normal heart sounds.   Respiratory: Normal respiratory effort.  No retractions. Lungs CTAB. Gastrointestinal: Soft and nontender. No distention.  Musculoskeletal: No gross deformities of extremities. Neurologic:  Normal speech and language.  Skin:  Skin is warm, dry and intact. No rash noted.  ____________________________________________    EKG:    EKG Interpretation  Date/Time:  Saturday July 29 2021 00:45:25 EDT Ventricular Rate:  101 PR Interval:  168 QRS Duration: 94 QT Interval:  399 QTC Calculation: 458 R Axis:   86 Text Interpretation: Sinus tachycardia Atrial premature complexes Anteroseptal infarct, age indeterminate Lateral leads are also involved Confirmed by [email or web address] [phone]) on 07/29/2021 1:39:20 AM        ____________________________________________   LABS (all labs ordered are listed, but only abnormal results are displayed)  Labs Reviewed  COMPREHENSIVE METABOLIC PANEL - Abnormal; Notable for the following components:      Result Value   Potassium 3.4 (*)    Glucose, Bld 139 (*)    Total Protein 8.9 (*)    AST 114 (*)    Alkaline Phosphatase 138 (*)    All other components within normal limits  CBC WITH DIFFERENTIAL/PLATELET - Abnormal; Notable for the following components:   WBC 14.1 (*)    RBC 5.56 (*)    HCT 47.1 (*)    RDW 15.9 (*)    Neutro Abs 11.6 (*)    Abs Immature Granulocytes 0.08 (*)    All other components within normal limits  TROPONIN I (HIGH SENSITIVITY) - Abnormal; Notable for the following components:   Troponin I (High Sensitivity) 17,854 (*)    All other components within normal limits  RESP PANEL BY RT-PCR (FLU A&B, COVID) ARPGX2  LIPASE, BLOOD  HEPARIN LEVEL (UNFRACTIONATED)  TROPONIN I (HIGH SENSITIVITY)   ____________________________________________  RADIOLOGY  DG Chest Portable 1  View  Result Date: 07/29/2021 CLINICAL DATA:  Chest pain, vomiting, and diarrhea.  Weakness. EXAM: PORTABLE CHEST 1 VIEW COMPARISON:  09/17/2018 FINDINGS: Mild cardiac enlargement. No vascular congestion, edema, or consolidation. There is evidence of bronchiectasis. No pleural effusions. No pneumothorax. Mediastinal contours appear intact. Old right rib fractures. IMPRESSION: Cardiac enlargement. No edema or consolidation. Bronchitic changes. Electronically Signed   By: 11/17/2018 M.D.   On: 07/29/2021 00:48    ____________________________________________   PROCEDURES  Procedure(s) performed:   Procedures  CRITICAL CARE Performed by: 07/31/2021 Total critical care time: 35 minutes Critical care time was exclusive of separately billable procedures and treating other patients. Critical care was necessary to treat or prevent imminent or life-threatening deterioration. Critical care was time spent personally by me on the following  activities: development of treatment plan with patient and/or surrogate as well as nursing, discussions with consultants, evaluation of patient's response to treatment, examination of patient, obtaining history from patient or surrogate, ordering and performing treatments and interventions, ordering and review of laboratory studies, ordering and review of radiographic studies, pulse oximetry and re-evaluation of patient's condition.  Alona Bene, MD Emergency Medicine  ____________________________________________   INITIAL IMPRESSION / ASSESSMENT AND PLAN / ED COURSE  Pertinent labs & imaging results that were available during my care of the patient were reviewed by me and considered in my medical decision making (see chart for details).   Patient presents emergency department with nausea, vomiting, diarrhea over the past 2 days.  No abdominal pain.  Nontender abdomen.  Vital signs are largely within normal limits.  Will obtain screening blood work with  several days of symptoms to assess for kidney injury or electrolyte disturbance.  Plan for IV fluids and Zofran.  01:15 AM  Back to evaluate patient after IV fluids and Zofran.  Her lab work here is reassuring.  No acute kidney injury.  In reviewing her symptoms she mentions some intermittent chest discomfort she has been having over the past several days.  She is not having any active chest pain.  EKG along with chest x-ray and troponin ordered for follow-up on this. From GI standpoint she is feeling much better.   02:30 AM  The patient's troponin has come back very high at 17,800.  She not having any active chest pain.  She is last time she had chest pain was yesterday. Updated her on these lab results. Concern for NSTEMI. Will start heparin and discuss with Cardiology.   03:25 AM  Spoke with Dr. Lendell Caprice with Cardiology. Will admit and I placed temporary admit orders at his request. Discussed timing of direct admit to bed at [facility] vs ED transfer. Will wait for a bed and trend troponin. No active CP or other symptoms.   I reviewed all nursing notes, vitals, pertinent old records, EKGs, labs, imaging (as available).  ____________________________________________  FINAL CLINICAL IMPRESSION(S) / ED DIAGNOSES  Final diagnoses:  NSTEMI (non-ST elevated myocardial infarction) (HCC)  Nausea vomiting and diarrhea     MEDICATIONS GIVEN DURING THIS VISIT:  Medications  heparin ADULT infusion 100 units/mL (25000 units/235mL) (1,300 Units/hr Intravenous New Bag/Given 07/29/21 0257)  aspirin tablet 325 mg (has no administration in time range)  sodium chloride 0.9 % bolus 500 mL (0 mLs Intravenous Stopped 07/29/21 0040)  ondansetron (ZOFRAN) injection 4 mg (4 mg Intravenous Given 07/28/21 2327)  heparin bolus via infusion 4,000 Units (4,000 Units Intravenous Bolus from Bag 07/29/21 0250)    Note:  This document was prepared using Dragon voice recognition software and may include unintentional  dictation errors.  Alona Bene, MD, [facility] Emergency Medicine    Delaney Perona, Arlyss Repress, MD 07/29/21 [phone]

## 2021-07-29 ENCOUNTER — Emergency Department (HOSPITAL_COMMUNITY): Payer: Medicare Other

## 2021-07-29 DIAGNOSIS — I5041 Acute combined systolic (congestive) and diastolic (congestive) heart failure: Secondary | ICD-10-CM | POA: Diagnosis present

## 2021-07-29 DIAGNOSIS — I214 Non-ST elevation (NSTEMI) myocardial infarction: Secondary | ICD-10-CM | POA: Diagnosis present

## 2021-07-29 DIAGNOSIS — I213 ST elevation (STEMI) myocardial infarction of unspecified site: Secondary | ICD-10-CM | POA: Diagnosis not present

## 2021-07-29 DIAGNOSIS — Z20822 Contact with and (suspected) exposure to covid-19: Secondary | ICD-10-CM | POA: Diagnosis present

## 2021-07-29 DIAGNOSIS — R112 Nausea with vomiting, unspecified: Secondary | ICD-10-CM | POA: Diagnosis present

## 2021-07-29 DIAGNOSIS — E782 Mixed hyperlipidemia: Secondary | ICD-10-CM | POA: Diagnosis present

## 2021-07-29 DIAGNOSIS — Z635 Disruption of family by separation and divorce: Secondary | ICD-10-CM | POA: Diagnosis not present

## 2021-07-29 DIAGNOSIS — I252 Old myocardial infarction: Secondary | ICD-10-CM | POA: Diagnosis not present

## 2021-07-29 DIAGNOSIS — Z6841 Body Mass Index (BMI) 40.0 and over, adult: Secondary | ICD-10-CM | POA: Diagnosis not present

## 2021-07-29 DIAGNOSIS — R197 Diarrhea, unspecified: Secondary | ICD-10-CM | POA: Diagnosis present

## 2021-07-29 DIAGNOSIS — E119 Type 2 diabetes mellitus without complications: Secondary | ICD-10-CM | POA: Diagnosis present

## 2021-07-29 DIAGNOSIS — I2584 Coronary atherosclerosis due to calcified coronary lesion: Secondary | ICD-10-CM | POA: Diagnosis present

## 2021-07-29 DIAGNOSIS — E785 Hyperlipidemia, unspecified: Secondary | ICD-10-CM | POA: Diagnosis not present

## 2021-07-29 DIAGNOSIS — Z8249 Family history of ischemic heart disease and other diseases of the circulatory system: Secondary | ICD-10-CM | POA: Diagnosis not present

## 2021-07-29 DIAGNOSIS — I110 Hypertensive heart disease with heart failure: Secondary | ICD-10-CM | POA: Diagnosis present

## 2021-07-29 DIAGNOSIS — I25118 Atherosclerotic heart disease of native coronary artery with other forms of angina pectoris: Secondary | ICD-10-CM | POA: Diagnosis present

## 2021-07-29 DIAGNOSIS — G4733 Obstructive sleep apnea (adult) (pediatric): Secondary | ICD-10-CM | POA: Diagnosis present

## 2021-07-29 DIAGNOSIS — F32A Depression, unspecified: Secondary | ICD-10-CM | POA: Diagnosis present

## 2021-07-29 DIAGNOSIS — Z818 Family history of other mental and behavioral disorders: Secondary | ICD-10-CM | POA: Diagnosis not present

## 2021-07-29 DIAGNOSIS — I255 Ischemic cardiomyopathy: Secondary | ICD-10-CM | POA: Diagnosis present

## 2021-07-29 DIAGNOSIS — J449 Chronic obstructive pulmonary disease, unspecified: Secondary | ICD-10-CM | POA: Diagnosis present

## 2021-07-29 DIAGNOSIS — I10 Essential (primary) hypertension: Secondary | ICD-10-CM | POA: Diagnosis not present

## 2021-07-29 DIAGNOSIS — F410 Panic disorder [episodic paroxysmal anxiety] without agoraphobia: Secondary | ICD-10-CM | POA: Diagnosis present

## 2021-07-29 DIAGNOSIS — Z79899 Other long term (current) drug therapy: Secondary | ICD-10-CM | POA: Diagnosis not present

## 2021-07-29 DIAGNOSIS — Z811 Family history of alcohol abuse and dependence: Secondary | ICD-10-CM | POA: Diagnosis not present

## 2021-07-29 DIAGNOSIS — I2510 Atherosclerotic heart disease of native coronary artery without angina pectoris: Secondary | ICD-10-CM | POA: Diagnosis not present

## 2021-07-29 DIAGNOSIS — Z955 Presence of coronary angioplasty implant and graft: Secondary | ICD-10-CM | POA: Diagnosis not present

## 2021-07-29 LAB — COMPREHENSIVE METABOLIC PANEL
ALT: 26 U/L (ref 0–44)
AST: 114 U/L — ABNORMAL HIGH (ref 15–41)
Albumin: 4.3 g/dL (ref 3.5–5.0)
Alkaline Phosphatase: 138 U/L — ABNORMAL HIGH (ref 38–126)
Anion gap: 8 (ref 5–15)
BUN: 11 mg/dL (ref 6–20)
CO2: 27 mmol/L (ref 22–32)
Calcium: 9 mg/dL (ref 8.9–10.3)
Chloride: 100 mmol/L (ref 98–111)
Creatinine, Ser: 0.91 mg/dL (ref 0.44–1.00)
GFR, Estimated: >60 mL/min (ref >60)
Glucose, Bld: 139 mg/dL — ABNORMAL HIGH (ref 70–99)
Potassium: 3.4 mmol/L — ABNORMAL LOW (ref 3.5–5.1)
Sodium: 135 mmol/L (ref 135–145)
Total Bilirubin: 1.1 mg/dL (ref 0.3–1.2)
Total Protein: 8.9 g/dL — ABNORMAL HIGH (ref 6.5–8.1)

## 2021-07-29 LAB — LIPASE, BLOOD: Lipase: 23 U/L (ref 11–51)

## 2021-07-29 LAB — RESP PANEL BY RT-PCR (FLU A&B, COVID) ARPGX2
Influenza A by PCR: NEGATIVE
Influenza B by PCR: NEGATIVE
SARS Coronavirus 2 by RT PCR: NEGATIVE

## 2021-07-29 LAB — HEPARIN LEVEL (UNFRACTIONATED)
Heparin Unfractionated: <0.1 IU/mL — ABNORMAL LOW (ref 0.30–0.70)
Heparin Unfractionated: 0.27 IU/mL — ABNORMAL LOW (ref 0.30–0.70)

## 2021-07-29 LAB — HIV ANTIBODY (ROUTINE TESTING W REFLEX): HIV Screen 4th Generation wRfx: NONREACTIVE

## 2021-07-29 LAB — HEMOGLOBIN A1C
Hgb A1c MFr Bld: 6.4 % — ABNORMAL HIGH (ref 4.8–5.6)
Mean Plasma Glucose: 136.98 mg/dL

## 2021-07-29 LAB — TROPONIN I (HIGH SENSITIVITY)
Troponin I (High Sensitivity): 17854 ng/L (ref <18)
Troponin I (High Sensitivity): 17022 ng/L (ref <18)

## 2021-07-29 MED ORDER — SODIUM CHLORIDE 0.9 % WEIGHT BASED INFUSION
1.0000 mL/kg/h | INTRAVENOUS | Status: DC
  Administered 2021-07-31: 1 mL/kg/h via INTRAVENOUS

## 2021-07-29 MED ORDER — ASPIRIN 325 MG PO TABS
325.0000 mg | ORAL_TABLET | Freq: Once | ORAL | Status: AC
  Administered 2021-07-29: 325 mg via ORAL
  Filled 2021-07-29: qty 1

## 2021-07-29 MED ORDER — SODIUM CHLORIDE 0.9 % WEIGHT BASED INFUSION
3.0000 mL/kg/h | INTRAVENOUS | Status: DC
  Administered 2021-07-31: 3 mL/kg/h via INTRAVENOUS

## 2021-07-29 MED ORDER — METOPROLOL TARTRATE 25 MG PO TABS
25.0000 mg | ORAL_TABLET | Freq: Two times a day (BID) | ORAL | Status: DC
Start: 2021-07-29 — End: 2021-08-01
  Administered 2021-07-29 – 2021-08-01 (×7): 25 mg via ORAL
  Filled 2021-07-29 (×7): qty 1

## 2021-07-29 MED ORDER — SODIUM CHLORIDE 0.9 % IV SOLN: 250.0000 mL | INTRAVENOUS | Status: DC | PRN

## 2021-07-29 MED ORDER — ASPIRIN 81 MG PO CHEW
324.0000 mg | CHEWABLE_TABLET | ORAL | Status: AC
Start: 2021-07-29 — End: 2021-07-29
  Administered 2021-07-29: 324 mg via ORAL
  Filled 2021-07-29: qty 4

## 2021-07-29 MED ORDER — HEPARIN BOLUS VIA INFUSION
4000.0000 [IU] | Freq: Once | INTRAVENOUS | Status: AC
  Administered 2021-07-29: 4000 [IU] via INTRAVENOUS

## 2021-07-29 MED ORDER — SODIUM CHLORIDE 0.9% FLUSH: 3.0000 mL | INTRAVENOUS | Status: DC | PRN

## 2021-07-29 MED ORDER — ALPRAZOLAM 0.5 MG PO TABS
1.0000 mg | ORAL_TABLET | Freq: Two times a day (BID) | ORAL | Status: DC
  Administered 2021-07-30 – 2021-08-01 (×3): 1 mg via ORAL
  Filled 2021-07-29 (×5): qty 2

## 2021-07-29 MED ORDER — ASPIRIN 300 MG RE SUPP
300.0000 mg | RECTAL | Status: AC
  Filled 2021-07-29: qty 1

## 2021-07-29 MED ORDER — HEPARIN (PORCINE) 25000 UT/250ML-% IV SOLN
1900.0000 [IU]/h | INTRAVENOUS | Status: DC
  Administered 2021-07-29: 1650 [IU]/h via INTRAVENOUS
  Administered 2021-07-29: 03:00:00 1300 [IU]/h via INTRAVENOUS
  Administered 2021-07-30 – 2021-07-31 (×2): 1700 [IU]/h via INTRAVENOUS
  Filled 2021-07-29 (×4): qty 250

## 2021-07-29 MED ORDER — NITROGLYCERIN 0.4 MG SL SUBL: 0.4000 mg | SUBLINGUAL_TABLET | SUBLINGUAL | Status: DC | PRN

## 2021-07-29 MED ORDER — SODIUM CHLORIDE 0.9% FLUSH
3.0000 mL | Freq: Two times a day (BID) | INTRAVENOUS | Status: DC
  Administered 2021-07-29 – 2021-07-31 (×3): 3 mL via INTRAVENOUS

## 2021-07-29 MED ORDER — SODIUM CHLORIDE 0.9 % WEIGHT BASED INFUSION: 1.0000 mL/kg/h | INTRAVENOUS | Status: DC

## 2021-07-29 MED ORDER — ASPIRIN EC 81 MG PO TBEC
81.0000 mg | DELAYED_RELEASE_TABLET | Freq: Every day | ORAL | Status: DC
Start: 2021-07-30 — End: 2021-08-01
  Administered 2021-07-30 – 2021-08-01 (×2): 81 mg via ORAL
  Filled 2021-07-29 (×2): qty 1

## 2021-07-29 MED ORDER — HEPARIN BOLUS VIA INFUSION
2700.0000 [IU] | Freq: Once | INTRAVENOUS | Status: AC
  Administered 2021-07-29: 2700 [IU] via INTRAVENOUS
  Filled 2021-07-29: qty 2700

## 2021-07-29 MED ORDER — ACETAMINOPHEN 325 MG PO TABS
650.0000 mg | ORAL_TABLET | ORAL | Status: DC | PRN
  Filled 2021-07-29: qty 2

## 2021-07-29 MED ORDER — NITROGLYCERIN IN D5W 200-5 MCG/ML-% IV SOLN
0.0000 ug/min | INTRAVENOUS | Status: DC
  Administered 2021-07-29: 5 ug/min via INTRAVENOUS
  Filled 2021-07-29: qty 250

## 2021-07-29 MED ORDER — ATORVASTATIN CALCIUM 80 MG PO TABS
80.0000 mg | ORAL_TABLET | Freq: Every day | ORAL | Status: DC
  Administered 2021-07-29: 80 mg via ORAL
  Filled 2021-07-29: qty 1

## 2021-07-29 MED ORDER — SODIUM CHLORIDE 0.9 % WEIGHT BASED INFUSION: 3.0000 mL/kg/h | INTRAVENOUS | Status: DC

## 2021-07-29 MED ORDER — ASPIRIN 81 MG PO CHEW
81.0000 mg | CHEWABLE_TABLET | ORAL | Status: DC
Start: 2021-07-30 — End: 2021-07-29

## 2021-07-29 MED ORDER — ASPIRIN 81 MG PO CHEW
81.0000 mg | CHEWABLE_TABLET | ORAL | Status: AC
  Administered 2021-07-31: 81 mg via ORAL
  Filled 2021-07-29: qty 1

## 2021-07-29 MED ORDER — ATORVASTATIN CALCIUM 40 MG PO TABS: 40.0000 mg | ORAL_TABLET | Freq: Every day | ORAL | Status: DC

## 2021-07-29 MED ORDER — ONDANSETRON HCL 4 MG/2ML IJ SOLN: 4.0000 mg | Freq: Four times a day (QID) | INTRAMUSCULAR | Status: DC | PRN

## 2021-07-29 NOTE — Progress Notes (Signed)
Patient arrived to room 6 east 05 from [facility] ED. Patient is alert & oriented x4, ambulatory, on Heparin gtt 35mL/h. No complaint of chest pain, SOB or discomfort at this time. Patient currently resting in bed, call light within reach. Cardiology notified. No orders at this time. Will continue to monitor.

## 2021-07-29 NOTE — Progress Notes (Signed)
Date and time results received: 07/29/21 0231 (use smartphrase ".now" to insert current time)  Test: troponin Critical Value: [id number]  Name of Provider Notified: Dr. Jacqulyn Bath  Orders Received? Or Actions Taken?: Orders Received - See Orders for details

## 2021-07-29 NOTE — Progress Notes (Signed)
ANTICOAGULATION CONSULT NOTE - Follow-up Consult  Pharmacy Consult for Heparin Indication: chest pain/ACS  No Known Allergies  Patient Measurements: Height: 5\' 7"  (170.2 cm) Weight: 122.9 kg (271 lb) IBW/kg (Calculated) : 61.6 Heparin Dosing Weight: 90 kg  Vital Signs: Temp: 98.8 F (37.1 C) (08/20 1708) Temp Source: Oral (08/20 1013) BP: 135/84 (08/20 1013) Pulse Rate: 93 (08/20 0920)  Labs: Recent Labs    07/28/21 2320 07/29/21 0130 07/29/21 0333 07/29/21 0837 07/29/21 1635  HGB 14.9  --   --   --   --   HCT 47.1*  --   --   --   --   PLT 303  --   --   --   --   HEPARINUNFRC  --   --   --  <0.10* 0.27*  CREATININE 0.91  --   --   --   --   TROPONINIHS  --  07/31/21* 17,022*  --   --     Estimated Creatinine Clearance: 92.7 mL/min (by C-G formula based on SCr of 0.91 mg/dL).   Medical History: Past Medical History:  Diagnosis Date   Anxiety    Arthritis    Asthma    Carpal tunnel syndrome    COPD (chronic obstructive pulmonary disease) (HCC)    Depression    Dyspnea    a. Adm 12/2013 for dyspnea/palps: normal stress-pics-only Lexiscan nuc, echo unremarkable with normal EF, d-dimer normal, ruled out for MI.   Elevated lipids    HLD (hyperlipidemia)    Hyperglycemia    Hypertension    Morbid obesity (HCC)    Panic attacks    Sleep apnea    uses CPAP    Medications:  No anticoagulants prior to admission   Assessment: Annette Bryant is a 60 y.o. female presented with emesis/diarrhea at [facility] ED. During ED stay, trop returned back at 17,800, pt with no complaints of chest pain. Pt was transferred to [facility] for further cardiac work-up. Pharmacy asked to dose heparin for ACS.   Heparin level slightly subtherapeutic but trending up at 0.27 after bolus and increase to 1550 units/hr. CBC is within normal limits. No signs of bleeding or problem with IV site per RN.   Goal of Therapy:  Heparin level 0.3-0.7 units/ml Monitor platelets by anticoagulation protocol:  Yes   Plan:  Increase IV heparin gtt to 1650 units/hr (16.5 ml/hr). Daily heparin level and CBC Monitor for signs and symptoms of bleeding  Thank you for involving pharmacy in this patient's care.  [facility], PharmD, BCPS, BCCCP Clinical Pharmacist Please refer to [facility] for [facility] numbers 07/29/2021 5:43 PM  **Pharmacist phone directory can be found on [email or web address] listed under [facility]**

## 2021-07-29 NOTE — H&P (Addendum)
History & Physical    Patient ID: Annette Bryant MRN: [id number], DOB/AGE: 1961/04/19   Admit date: 07/28/2021  Primary Physician: Gareth Morgan, MD Primary Cardiologist: New to [facility]  Patient Profile    Annette Bryant is a 60yo F with a hx of HLD, HTN, morbid obesity, OSA on CPAP, COPD, anxiety, and depression who presented to [facility] 07/29/21 with chest pain and persistent nausea and diarrhea since Thursday.   Past Medical History   Past Medical History:  Diagnosis Date   Anxiety    Arthritis    Asthma    Carpal tunnel syndrome    COPD (chronic obstructive pulmonary disease) (HCC)    Depression    Dyspnea    a. Adm 12/2013 for dyspnea/palps: normal stress-pics-only Lexiscan nuc, echo unremarkable with normal EF, d-dimer normal, ruled out for MI.   Elevated lipids    HLD (hyperlipidemia)    Hyperglycemia    Hypertension    Morbid obesity (HCC)    Panic attacks    Sleep apnea    uses CPAP    Past Surgical History:  Procedure Laterality Date   CESAREAN SECTION     CHOLECYSTECTOMY N/A 08/01/2017   Procedure: LAPAROSCOPIC CHOLECYSTECTOMY;  Surgeon: Manus Rudd, MD;  Location: [facility]S;  Service: General;  Laterality: N/A;   COLONOSCOPY WITH PROPOFOL N/A 11/21/2020   Procedure: COLONOSCOPY WITH PROPOFOL;  Surgeon: Corbin Ade, MD;  Location: [facility] SUITE;  Service: Endoscopy;  Laterality: N/A;  12:00pm   TUBAL LIGATION       Allergies  No Known Allergies  History of Present Illness    Annette Bryant is a 60yo F with a hx as stated above who presented to [facility] after a two day hx of chest pain with radiation to the right elbow with associated nausea and diarrhea that began Thursday evening. Prior to that she was in her usual state of health with no recent chest pain, change in dyspnea, fatigue, LE edema, palpitations or recent illness with fever or chills. She states that her symptoms began as chest and elbow pain then progressed to ongoing nausea and diarrhea. Her symptoms  lasted all night Thursday and into Friday. Given their persistency, she presented for further evaluation.    In the ED, HsT found to be markedly elevated at 17854>>>17022. EKG shows ST with HR 101bpm, frequent PACs, anterior Q waves (present on prior tracing) and STE in lead I with STD in leads II, III, AVF. Cr stable at 0.91. K+ slightly low at 3.4. Otherwise labs appear stable. CXR with no edema or consolidation.   She was previously seen by cardiology 12/2013 for the evaluation of dyspnea with palpitations at which time she ruled out for MI. She underwent a stress test and an echocardiogram which were both normal. She has not been seen since that time. She denies personal hx of CAD however has a sister with several MI's and stent placement. She denies tobacco, alcohol or illicit drug use. CV risk factors include HLD, obesity and borderline DM2.   Home Medications    Prior to Admission medications   Medication Sig Start Date End Date Taking? Authorizing Provider  albuterol (PROVENTIL) (2.5 MG/3ML) 0.083% nebulizer solution Take 2.5 mg by nebulization every 6 (six) hours as needed for wheezing or shortness of breath.     [provider]  ALPRAZolam Prudy Feeler) 1 MG tablet Take 1 tablet (1 mg total) by mouth 2 (two) times daily. 10/29/17   Myrlene Broker, MD  atorvastatin (LIPITOR) 40 MG tablet Take 40 mg by mouth at bedtime.  03/27/17   [provider]  budesonide-formoterol (SYMBICORT) 160-4.5 MCG/ACT inhaler Inhale 2 puffs into the lungs 2 (two) times daily.    [provider]  cholecalciferol (VITAMIN D3) 25 MCG (1000 UNIT) tablet Take 1,000 Units by mouth daily.    [provider]  furosemide (LASIX) 40 MG tablet Take 40 mg by mouth daily as needed for fluid or edema.     [provider]  HYDROcodone-acetaminophen (NORCO) 7.5-325 MG tablet Take 0.5-1 tablets by mouth 3 (three) times daily as needed for severe pain.  10/12/17   [provider]   KLOR-CON M20 20 MEQ tablet Take 20 mEq by mouth daily. 08/21/20   [provider]  Multiple Vitamin (MULTIVITAMIN WITH MINERALS) TABS tablet Take 1 tablet by mouth daily.    [provider]  naproxen (NAPROSYN) 500 MG tablet Take 500 mg by mouth 2 (two) times daily as needed for moderate pain. As needed    [provider]  PROAIR HFA 108 (90 BASE) MCG/ACT inhaler Inhale 1-2 puffs into the lungs every 6 (six) hours as needed for wheezing or shortness of breath. 06/27/15   [provider]    Family History    Family History  Problem Relation Age of Onset   Coronary artery disease Sister [id number]       stents   Depression Sister    Coronary artery disease Sister    Heart attack Sister [id number]       CHF, MI   Schizophrenia Brother    Alcohol abuse Brother    Depression Mother    Alcohol abuse Father    Colon cancer Neg Hx     Social History    Social History   Socioeconomic History   Marital status: Legally Separated    Spouse name: Not on file   Number of children: Not on file   Years of education: Not on file   Highest education level: Not on file  Occupational History   Not on file  Tobacco Use   Smoking status: Never   Smokeless tobacco: Never  Vaping Use   Vaping Use: Never used  Substance and Sexual Activity   Alcohol use: No   Drug use: No   Sexual activity: Not Currently    Birth control/protection: Surgical  Other Topics Concern   Not on file  Social History Narrative   Not on file   Social Determinants of Health   Financial Resource Strain: Not on file  Food Insecurity: Not on file  Transportation Needs: Not on file  Physical Activity: Not on file  Stress: Not on file  Social Connections: Not on file  Intimate Partner Violence: Not on file     Review of Systems   See HPI as above   All other systems reviewed and are otherwise negative except as noted above.  Physical Exam    Blood pressure 137/89, pulse 93,  temperature 98.7 F (37.1 C), temperature source Oral, resp. rate 18, height 5\' 7"  (1.702 m), weight 122 kg, last menstrual period 07/11/2017, SpO2 100 %.   General: Obese, NAD Neck: Negative for carotid bruits. No JVD Lungs:Clear to ausculation bilaterally. Breathing is unlabored. Cardiovascular: RRR with S1 S2. No murmurs Abdomen: Soft, non-tender, non-distended. No obvious abdominal masses. Extremities: No edema. Radial pulses 2+ bilaterally Neuro: Alert and oriented. No focal deficits. No facial asymmetry. MAE spontaneously. Psych: Responds to questions appropriately with normal  affect.    Labs    Troponin (Point of Care Test) No results for input(s): TROPIPOC in the last 72 hours. No results for input(s): CKTOTAL, CKMB, TROPONINI in the last 72 hours. Lab Results  Component Value Date   WBC 14.1 (H) 07/28/2021   HGB 14.9 07/28/2021   HCT 47.1 (H) 07/28/2021   MCV 84.7 07/28/2021   PLT 303 07/28/2021    Recent Labs  Lab 07/28/21 2320  NA 135  K 3.4*  CL 100  CO2 27  BUN 11  CREATININE 0.91  CALCIUM 9.0  PROT 8.9*  BILITOT 1.1  ALKPHOS 138*  ALT 26  AST 114*  GLUCOSE 139*   Lab Results  Component Value Date   CHOL 203 (H) 12/11/2013   HDL 30 (L) 12/11/2013   LDLCALC 146 (H) 12/11/2013   TRIG 133 12/11/2013   Lab Results  Component Value Date   DDIMER 0.49 09/17/2018     Radiology Studies    DG Chest Portable 1 View  Result Date: 07/29/2021 CLINICAL DATA:  Chest pain, vomiting, and diarrhea.  Weakness. EXAM: PORTABLE CHEST 1 VIEW COMPARISON:  09/17/2018 FINDINGS: Mild cardiac enlargement. No vascular congestion, edema, or consolidation. There is evidence of bronchiectasis. No pleural effusions. No pneumothorax. Mediastinal contours appear intact. Old right rib fractures. IMPRESSION: Cardiac enlargement. No edema or consolidation. Bronchitic changes. Electronically Signed   By: Burman Nieves M.D.   On: 07/29/2021 00:48    ECG & Cardiac Imaging     Nuclear stress test 12/11/2013:  IMPRESSION:  Normal stress only lexiscan myovue. EF 60% No rest images will be  needed    Echocardiogram 12/11/2013:  Left ventricle: The cavity size was normal. Systolic  function was normal. The estimated ejection fraction was in  the range of 55% to 65%. Wall motion was normal; there were  no regional wall motion abnormalities. Left ventricular  diastolic function parameters were normal.   Assessment & Plan    1. NSTEMI: -Pt presented to [facility] after a two day hx of chest pain with radiation to the right elbow with associated nausea and diarrhea that began Thursday evening. Prior to that she was in her usual state of health with no recent chest pain, change in dyspnea, fatigue, LE edema, palpitations or recent illness with fever or chills. She states that her symptoms began as chest and elbow pain then progressed to ongoing nausea and diarrhea. Her symptoms lasted all night Thursday and into Friday. G -HsT found to be markedly elevated at 17854>>>17022.  -EKG shows ST with HR 101bpm, frequent PACs, anterior Q waves (present on prior tracing) and STE in lead I, AVL with STD in leads II, III, AVF.  -Cr stable at 0.91. K+ slightly low at 3.4. Otherwise labs appear stable. -CXR with no edema or consolidation] -Plan to continue IV Heparin and plan for cath on Monday however patient appears to be minimizing chest pain and appears more uncomfortable than verbalizing. May need to perform urgent cath if symptoms persist -Obtain echocardiogram  -Start ASA, high intensity statin  -Obtain lipid panel, HbA1c -Cardiac catheterization was discussed with the patient fully. The patient understands that risks include but are not limited to stroke (1 in 1000), death (1 in 1000), kidney failure [usually temporary] (1 in 500), bleeding (1 in 200), allergic reaction [possibly serious] (1 in 200).  The patient understands and is willing to proceed.    -Prior cardiac workup from  2015 for the evaluation of dyspnea with nuclear stress  test and echocardiogram with no evidence of ischemia/infarct, normal LVEF with no WMA. She has not been seen since that time.  -Continue IV heparin for NSTEMI with plans for cardiac catheterization on 07/31/21 for further ischemic evaluation  -Add IV NTG and follow chest pain   2. Obesity: -BMI, 42 -May need nutrition consultation  -Increase activity level   3. HLD: -Last LDL in Epic from 12/2013 at 146 -Repeat Lipid panel and plan to start atorvastatin 80mg  PO QD   4. HTN: -Stable, 135/84>>137/89 -Not on PTA antihypertensives  -Will start low dose beta blocker   5. OSA on CPAP: -Reports compliance   6. PACs: -On EKG, asymptomatic -Will add low dose beta blocker   Severity of Illness: The appropriate patient status for this patient is INPATIENT. Inpatient status is judged to be reasonable and necessary in order to provide the required intensity of service to ensure the patient's safety. The patient's presenting symptoms, physical exam findings, and initial radiographic and laboratory data in the context of their chronic comorbidities is felt to place them at high risk for further clinical deterioration. Furthermore, it is not anticipated that the patient will be medically stable for discharge from the hospital within 2 midnights of admission. The following factors support the patient status of inpatient.   " The patient's presenting symptoms include chest pain. " The worrisome physical exam findings include elevated HsT. " The initial radiographic and laboratory data are worrisome because of abnormal EKG. " The chronic co-morbidities include obesity, HTN, HLD, DM2.   * I certify that at the point of admission it is my clinical judgment that the patient will require inpatient hospital care spanning beyond 2 midnights from the point of admission due to high intensity of service, high risk for further deterioration and high frequency  of surveillance required.*    Signed, NP-C [facility] Pager: [phone] 07/29/2021, @NOW   Attending Note:   The patient was seen and examined.  Agree with assessment and plan as noted above.  Changes made to the above note as needed.  Patient seen and independently examined with 07/31/2021, NP .   We discussed all aspects of the encounter. I agree with the assessment and plan as stated above.     STEMI - late presentation .  Pt has ST elevation in the lateral leads.   She is basically pain free.   Has occasional milder cp.  Will start IV ntg.  Cont heparin .   If she has worsening CP, she will need to go for cath urgently this weekend.  Otherwise, she will have cath with possible PCI on Monday .  I have discussed risks, benefits, options with her .   She understands and agrees to proceed.      I have spent a total of 40 minutes with patient reviewing hospital  notes , telemetry, EKGs, labs and examining patient as well as establishing an assessment and plan that was discussed with the patient.  > 50% of time was spent in direct patient care.    Georgie Chard, Sunday., MD, [facility] 07/29/2021, 12:08 PM [address],  [address] Office [phone] Pager [phone]

## 2021-07-29 NOTE — ED Notes (Signed)
Date and time results received: 07/29/21 0430 (use smartphrase ".now" to insert current time)  Test: troponin Critical Value: 17022  Name of Provider Notified: Dr. Jacqulyn Bath   Orders Received? Or Actions Taken?: see notes

## 2021-07-29 NOTE — Progress Notes (Signed)
ANTICOAGULATION CONSULT NOTE - Follow-up Consult  Pharmacy Consult for Heparin Indication: chest pain/ACS  No Known Allergies  Patient Measurements: Height: 5\' 7"  (170.2 cm) Weight: 122.9 kg (271 lb) IBW/kg (Calculated) : 61.6 Heparin Dosing Weight: 90 kg  Vital Signs: Temp: 98.7 F (37.1 C) (08/20 1013) Temp Source: Oral (08/20 1013) BP: 135/84 (08/20 1013) Pulse Rate: 93 (08/20 0920)  Labs: Recent Labs    07/28/21 2320 07/29/21 0130 07/29/21 0333 07/29/21 0837  HGB 14.9  --   --   --   HCT 47.1*  --   --   --   PLT 303  --   --   --   HEPARINUNFRC  --   --   --  <0.10*  CREATININE 0.91  --   --   --   TROPONINIHS  --  07/31/21* 17,022*  --      Estimated Creatinine Clearance: 92.7 mL/min (by C-G formula based on SCr of 0.91 mg/dL).   Medical History: Past Medical History:  Diagnosis Date   Anxiety    Arthritis    Asthma    Carpal tunnel syndrome    COPD (chronic obstructive pulmonary disease) (HCC)    Depression    Dyspnea    a. Adm 12/2013 for dyspnea/palps: normal stress-pics-only Lexiscan nuc, echo unremarkable with normal EF, d-dimer normal, ruled out for MI.   Elevated lipids    HLD (hyperlipidemia)    Hyperglycemia    Hypertension    Morbid obesity (HCC)    Panic attacks    Sleep apnea    uses CPAP    Medications:  Med rec pending  Assessment: Annette Bryant is a 60 y.o. female presented with emesis/diarrhea at [facility] ED. During ED stay, trop returned back at 17,800, pt with no complaints of chest pain. Pt was transferred to [facility] for further cardiac work-up. Pharmacy asked to dose heparin for ACS.   Heparin level undetectable on 6 hour check. H/H, PLT stable and WNL. No signs of bleeding or problem with IV site per RN.   Goal of Therapy:  Heparin level 0.3-0.7 units/ml Monitor platelets by anticoagulation protocol: Yes   Plan:  Heparin IV bolus 2700 units Increase IV heparin gtt to 1550 units/j Will f/u heparin level in 6 hours Daily  heparin level and CBC Monitor for signs and symptoms of bleeding  Thank you for involving pharmacy in this patient's care.  [facility], PharmD PGY1 Ambulatory Care Pharmacy Resident 07/29/2021 10:38 AM  **Pharmacist phone directory can be found on [email or web address] listed under [facility]**

## 2021-07-29 NOTE — Progress Notes (Signed)
ANTICOAGULATION CONSULT NOTE - Initial Consult  Pharmacy Consult for Heparin Indication: chest pain/ACS  No Known Allergies  Patient Measurements: Height: 5\' 7"  (170.2 cm) Weight: 122 kg (269 lb) IBW/kg (Calculated) : 61.6 Heparin Dosing Weight: 90 kg  Vital Signs: Temp: 99.8 F (37.7 C) (08/19 2009) Temp Source: Oral (08/19 2009) BP: 156/84 (08/20 0100) Pulse Rate: 79 (08/20 0100)  Labs: Recent Labs    07/28/21 2320 07/29/21 0130  HGB 14.9  --   HCT 47.1*  --   PLT 303  --   CREATININE 0.91  --   TROPONINIHS  --  07/31/21*    Estimated Creatinine Clearance: 92.4 mL/min (by C-G formula based on SCr of 0.91 mg/dL).   Medical History: Past Medical History:  Diagnosis Date   Anxiety    Arthritis    Asthma    Carpal tunnel syndrome    COPD (chronic obstructive pulmonary disease) (HCC)    Depression    Dyspnea    a. Adm 12/2013 for dyspnea/palps: normal stress-pics-only Lexiscan nuc, echo unremarkable with normal EF, d-dimer normal, ruled out for MI.   Elevated lipids    HLD (hyperlipidemia)    Hyperglycemia    Hypertension    Morbid obesity (HCC)    Panic attacks    Sleep apnea    uses CPAP    Medications:  Med rec pending  Assessment: 60 y.o. presents with emesis/diarrhea. Trop elevated to 17,854. To begin heparin for ACS. No AC PTA. CBC ok on admission.  Goal of Therapy:  Heparin level 0.3-0.7 units/ml Monitor platelets by anticoagulation protocol: Yes   Plan:  Heparin IV bolus 4000 units Heparin gtt at 1300 units/hr Will f/u heparin level in 6 hours Daily heparin level and CBC  60, PharmD, BCPS Please see amion for complete clinical pharmacist phone list 07/29/2021,2:39 AM

## 2021-07-29 NOTE — ED Notes (Signed)
Report given to [facility]  

## 2021-07-30 ENCOUNTER — Inpatient Hospital Stay (HOSPITAL_COMMUNITY): Payer: Medicare Other

## 2021-07-30 DIAGNOSIS — I214 Non-ST elevation (NSTEMI) myocardial infarction: Secondary | ICD-10-CM | POA: Diagnosis not present

## 2021-07-30 DIAGNOSIS — I10 Essential (primary) hypertension: Secondary | ICD-10-CM

## 2021-07-30 DIAGNOSIS — E782 Mixed hyperlipidemia: Secondary | ICD-10-CM

## 2021-07-30 LAB — ECHOCARDIOGRAM COMPLETE
AR max vel: 2.62 cm2
AV Area VTI: 2.73 cm2
AV Area mean vel: 2.63 cm2
AV Mean grad: 5 mmHg
AV Peak grad: 8.8 mmHg
Ao pk vel: 1.48 m/s
Area-P 1/2: 2.69 cm2
Calc EF: 47.6 %
Height: 67 in
MV VTI: 3 cm2
S' Lateral: 3.7 cm
Single Plane A2C EF: 46.4 %
Single Plane A4C EF: 48.1 %
Weight: 4336 oz

## 2021-07-30 LAB — CBC
HCT: 39.4 % (ref 36.0–46.0)
Hemoglobin: 12.6 g/dL (ref 12.0–15.0)
MCH: 27 pg (ref 26.0–34.0)
MCHC: 32 g/dL (ref 30.0–36.0)
MCV: 84.4 fL (ref 80.0–100.0)
Platelets: 231 10*3/uL (ref 150–400)
RBC: 4.67 MIL/uL (ref 3.87–5.11)
RDW: 15.9 % — ABNORMAL HIGH (ref 11.5–15.5)
WBC: 13.1 10*3/uL — ABNORMAL HIGH (ref 4.0–10.5)
nRBC: 0 % (ref 0.0–0.2)

## 2021-07-30 LAB — BASIC METABOLIC PANEL
Anion gap: 8 (ref 5–15)
BUN: 11 mg/dL (ref 6–20)
CO2: 28 mmol/L (ref 22–32)
Calcium: 8.9 mg/dL (ref 8.9–10.3)
Chloride: 100 mmol/L (ref 98–111)
Creatinine, Ser: 1.18 mg/dL — ABNORMAL HIGH (ref 0.44–1.00)
GFR, Estimated: 54 mL/min — ABNORMAL LOW (ref >60)
Glucose, Bld: 135 mg/dL — ABNORMAL HIGH (ref 70–99)
Potassium: 3.7 mmol/L (ref 3.5–5.1)
Sodium: 136 mmol/L (ref 135–145)

## 2021-07-30 LAB — LIPID PANEL
Cholesterol: 180 mg/dL (ref 0–200)
HDL: 39 mg/dL — ABNORMAL LOW (ref >40)
LDL Cholesterol: 112 mg/dL — ABNORMAL HIGH (ref 0–99)
Total CHOL/HDL Ratio: 4.6 RATIO
Triglycerides: 147 mg/dL (ref <150)
VLDL: 29 mg/dL (ref 0–40)

## 2021-07-30 LAB — HEPARIN LEVEL (UNFRACTIONATED): Heparin Unfractionated: 0.31 IU/mL (ref 0.30–0.70)

## 2021-07-30 MED ORDER — ROSUVASTATIN CALCIUM 20 MG PO TABS
20.0000 mg | ORAL_TABLET | Freq: Every day | ORAL | Status: DC
  Administered 2021-07-30 – 2021-07-31 (×2): 20 mg via ORAL
  Filled 2021-07-30 (×3): qty 1

## 2021-07-30 NOTE — Progress Notes (Signed)
  Echocardiogram 2D Echocardiogram has been performed.  Gerda Diss 07/30/2021, 11:34 AM

## 2021-07-30 NOTE — Progress Notes (Signed)
Progress Note  Patient Name: Annette Bryant Date of Encounter: 07/30/2021  [facility] Cardiologist: Nicholaos Schippers   Subjective   60 yo with hx of HTN, obesity, HLD transferred from [facility] with lateral STEMI - presented late.  Associated with lots of nausea and vomitting  Echo is scheduled today  She was having some intermittant CP yesterday - this cp has resolved after we started IV NTG   Inpatient Medications    Scheduled Meds:  ALPRAZolam  1 mg Oral BID   [START ON 07/31/2021] aspirin  81 mg Oral Pre-Cath   aspirin EC  81 mg Oral Daily   atorvastatin  80 mg Oral Daily   metoprolol tartrate  25 mg Oral BID   sodium chloride flush  3 mL Intravenous Q12H   Continuous Infusions:  sodium chloride     [START ON 07/31/2021] sodium chloride     Followed by   Melene Muller ON 07/31/2021] sodium chloride     heparin 1,650 Units/hr (07/29/21 1921)   nitroGLYCERIN 5 mcg/min (07/29/21 1217)   PRN Meds: sodium chloride, acetaminophen, nitroGLYCERIN, ondansetron (ZOFRAN) IV, sodium chloride flush   Vital Signs    Vitals:   07/29/21 1708 07/29/21 1944 07/30/21 0500 07/30/21 0737  BP:  125/75 128/79 111/67  Pulse:  83  85  Resp:  16 15 16   Temp: 98.8 F (37.1 C)  98.6 F (37 C) 98.5 F (36.9 C)  TempSrc:   Oral Oral  SpO2:  93%    Weight:      Height:        Intake/Output Summary (Last 24 hours) at 07/30/2021 0853 Last data filed at 07/29/2021 2030 Gross per 24 hour  Intake 660.85 ml  Output --  Net 660.85 ml   Last 3 Weights 07/29/2021 07/29/2021 07/28/2021  Weight (lbs) 271 lb 271 lb 8 oz 269 lb  Weight (kg) 122.925 kg 123.152 kg 122.018 kg      Telemetry    NSR  - Personally Reviewed  ECG     Nsr  - Personally Reviewed  Physical Exam   GEN: middle age, moderately obese female, NAD   Neck: No JVD Cardiac: RRR, no murmurs, rubs, or gallops.  Respiratory: Clear to auscultation bilaterally. GI: Soft, nontender, non-distended  MS: No edema; No  deformity. Neuro:  Nonfocal  Psych: Normal affect   Labs    High Sensitivity Troponin:   Recent Labs  Lab 07/29/21 0130 07/29/21 0333  TROPONINIHS 17,854* 17,022*      Chemistry Recent Labs  Lab 07/28/21 2320 07/30/21 0629  NA 135 136  K 3.4* 3.7  CL 100 100  CO2 27 28  GLUCOSE 139* 135*  BUN 11 11  CREATININE 0.91 1.18*  CALCIUM 9.0 8.9  PROT 8.9*  --   ALBUMIN 4.3  --   AST 114*  --   ALT 26  --   ALKPHOS 138*  --   BILITOT 1.1  --   GFRNONAA >60 54*  ANIONGAP 8 8     Hematology Recent Labs  Lab 07/28/21 2320 07/30/21 0629  WBC 14.1* 13.1*  RBC 5.56* 4.67  HGB 14.9 12.6  HCT 47.1* 39.4  MCV 84.7 84.4  MCH 26.8 27.0  MCHC 31.6 32.0  RDW 15.9* 15.9*  PLT 303 231    BNPNo results for input(s): BNP, PROBNP in the last 168 hours.   DDimer No results for input(s): DDIMER in the last 168 hours.   Radiology    DG Chest  Portable 1 View  Result Date: 07/29/2021 CLINICAL DATA:  Chest pain, vomiting, and diarrhea.  Weakness. EXAM: PORTABLE CHEST 1 VIEW COMPARISON:  09/17/2018 FINDINGS: Mild cardiac enlargement. No vascular congestion, edema, or consolidation. There is evidence of bronchiectasis. No pleural effusions. No pneumothorax. Mediastinal contours appear intact. Old right rib fractures. IMPRESSION: Cardiac enlargement. No edema or consolidation. Bronchitic changes. Electronically Signed   By: Burman Nieves M.D.   On: 07/29/2021 00:48    Cardiac Studies      Patient Profile     60 y.o. female transferred fro [facility] with a lateral STEMI.  Presented late.  Troponin is 17000  Assessment & Plan     STEMI:   late presentation .   Troponin was 17,000 Echo is scheduled for today . Will get cath tomorrow  We have discussed the risks, benefits, options of cath .   She understands and agrees to proceed.    HTN:  BP is stable  3.  HLD : Total cholesterol is 180.  Her LDL is 112.  Triglyceride levels 147.  She is on atorvastatin 40 mg a day on  admission.  I will change her to Crestor 20 mg a day. We may need to consider PCSK9 inhibitor if we can get her to go on rosuvastatin.  4.  Obstructive sleep apnea: She is currently on CPAP.   For questions or updates, please contact [facility] Please consult [email or web address] for contact info under        Signed, Kristeen Miss, MD  07/30/2021, 8:53 AM

## 2021-07-30 NOTE — H&P (View-Only) (Signed)
Progress Note  Patient Name: Annette KearnsSandra A Bryant Date of Encounter: 07/31/2021  [facility] Cardiologist:  New to Dr. Elease HashimotoNahser   Subjective   Patient states she is feeling, has not had any chest pain since admission, reports resolved diarrhea and emesis, denied any abdominal pain, oliguria.    Inpatient Medications    Scheduled Meds:  ALPRAZolam  1 mg Oral BID   aspirin EC  81 mg Oral Daily   metoprolol tartrate  25 mg Oral BID   rosuvastatin  20 mg Oral Daily   sodium chloride flush  3 mL Intravenous Q12H   Continuous Infusions:  sodium chloride     sodium chloride 1 mL/kg/hr (07/31/21 0853)   heparin 1,900 Units/hr (07/31/21 0402)   nitroGLYCERIN 5 mcg/min (07/29/21 1217)   PRN Meds: sodium chloride, acetaminophen, nitroGLYCERIN, ondansetron (ZOFRAN) IV, sodium chloride flush   Vital Signs    Vitals:   07/30/21 2126 07/31/21 0008 07/31/21 0358 07/31/21 0834  BP: 131/84 117/86 128/88 116/76  Pulse: 91 80 89 83  Resp: 18 18 18    Temp:  99.5 F (37.5 C) 98.8 F (37.1 C)   TempSrc:  Axillary Oral   SpO2: 93% 94% 97%   Weight:   124.7 kg   Height:        Intake/Output Summary (Last 24 hours) at 07/31/2021 1001 Last data filed at 07/31/2021 0403 Gross per 24 hour  Intake 600 ml  Output 1100 ml  Net -500 ml   Last 3 Weights 07/31/2021 07/29/2021 07/29/2021  Weight (lbs) 275 lb 271 lb 271 lb 8 oz  Weight (kg) 124.739 kg 122.925 kg 123.152 kg  Some encounter information is confidential and restricted. Go to Review Flowsheets activity to see all data.      Telemetry    Sinus rhythm 60-80s, occasional dropped QRS noted  - Personally Reviewed  ECG    EKG today showed SR, similar ST elevation of lateral leads and ST depression of inferolateral leads.  - Personally Reviewed  Physical Exam   GEN: No acute distress.  Obese.  Neck: Short and thick neck Cardiac: RRR, no murmurs, rubs, or gallops.  Respiratory: Clear to auscultation bilaterally. On room air. Speaks  in full sentence  GI: Soft, nontender, non-distended  MS: No BLE edema; No deformity. Radial pulse 2+ bilaterally Neuro:  A and oriented x3, follow commands appropriately   Psych: Mild anxiety   Labs    High Sensitivity Troponin:   Recent Labs  Lab 07/29/21 0130 07/29/21 0333  TROPONINIHS 17,854* 17,022*      Chemistry Recent Labs  Lab 07/28/21 2320 07/30/21 0629 07/31/21 0724  NA 135 136 136  K 3.4* 3.7 3.5  CL 100 100 102  CO2 27 28 27   GLUCOSE 139* 135* 107*  BUN 11 11 11   CREATININE 0.91 1.18* 0.90  CALCIUM 9.0 8.9 8.6*  PROT 8.9*  --   --   ALBUMIN 4.3  --   --   AST 114*  --   --   ALT 26  --   --   ALKPHOS 138*  --   --   BILITOT 1.1  --   --   GFRNONAA >60 54* >60  ANIONGAP 8 8 7      Hematology Recent Labs  Lab 07/28/21 2320 07/30/21 0629 07/31/21 0339  WBC 14.1* 13.1* 14.0*  RBC 5.56* 4.67 4.35  HGB 14.9 12.6 11.7*  HCT 47.1* 39.4 36.7  MCV 84.7 84.4 84.4  MCH 26.8 27.0 26.9  MCHC 31.6 32.0 31.9  RDW 15.9* 15.9* 15.6*  PLT 303 231 224    BNPNo results for input(s): BNP, PROBNP in the last 168 hours.   DDimer No results for input(s): DDIMER in the last 168 hours.   Radiology    ECHOCARDIOGRAM COMPLETE  Result Date: 07/30/2021    ECHOCARDIOGRAM REPORT   Patient Name:   Annette Bryant Date of Exam: 07/30/2021 Medical Rec #:  [id number]       Height:       67.0 in Accession #:    [id number]      Weight:       271.0 lb Date of Birth:  [birth year]       BSA:          2.301 m Patient Age:    59 years        BP:           111/67 mmHg Patient Gender: F               HR:           70 bpm. Exam Location:  Inpatient Procedure: 2D Echo, 3D Echo, Cardiac Doppler and Color Doppler Indications:    NSTEMI  History:        Patient has prior history of Echocardiogram examinations, most                 recent 12/11/2013. COPD; Risk Factors:Hypertension, Dyslipidemia                 and Sleep Apnea.  Sonographer:    Ross Ludwig RDCS (AE) Referring Phys: [phone] JILL D  MCDANIEL IMPRESSIONS  1. Left ventricular ejection fraction by 3D volume is 44 %. The left ventricle has mildly decreased function. The left ventricle demonstrates regional wall motion abnormalities (see scoring diagram/findings for description). The left ventricular internal cavity size was mildly dilated. There is severe asymmetric left ventricular hypertrophy. Left ventricular diastolic parameters are consistent with Grade I diastolic dysfunction (impaired relaxation). There is severe akinesis of the left ventricular, basal-mid inferior wall.  2. Right ventricular systolic function is normal. The right ventricular size is normal.  3. The mitral valve is normal in structure. Mild mitral valve regurgitation.  4. The aortic valve is grossly normal. Aortic valve regurgitation is not visualized. No aortic stenosis is present. FINDINGS  Left Ventricle: Left ventricular ejection fraction by 3D volume is 44 %. The left ventricle has mildly decreased function. The left ventricle demonstrates regional wall motion abnormalities. Severe akinesis of the left ventricular, basal-mid inferior wall. The left ventricular internal cavity size was mildly dilated. There is severe asymmetric left ventricular hypertrophy. Left ventricular diastolic parameters are consistent with Grade I diastolic dysfunction (impaired relaxation). Right Ventricle: The right ventricular size is normal. Right vetricular wall thickness was not well visualized. Right ventricular systolic function is normal. Left Atrium: Left atrial size was normal in size. Right Atrium: Right atrial size was normal in size. Pericardium: There is no evidence of pericardial effusion. Mitral Valve: The mitral valve is normal in structure. Mild mitral valve regurgitation. MV peak gradient, 3.7 mmHg. The mean mitral valve gradient is 1.0 mmHg. Tricuspid Valve: The tricuspid valve is normal in structure. Tricuspid valve regurgitation is not demonstrated. Aortic Valve: The aortic  valve is grossly normal. Aortic valve regurgitation is not visualized. No aortic stenosis is present. Aortic valve mean gradient measures 5.0 mmHg. Aortic valve peak gradient measures 8.8 mmHg. Aortic valve area, by VTI measures 2.73  cm. Pulmonic Valve: The pulmonic valve was grossly normal. Pulmonic valve regurgitation is not visualized. Aorta: The aortic root and ascending aorta are structurally normal, with no evidence of dilitation. IAS/Shunts: The atrial septum is grossly normal.  LEFT VENTRICLE PLAX 2D LVIDd:         5.10 cm         Diastology LVIDs:         3.70 cm         LV e' medial:    6.31 cm/s LV PW:         1.00 cm         LV E/e' medial:  8.3 LV IVS:        1.90 cm         LV e' lateral:   9.25 cm/s LVOT diam:     2.20 cm         LV E/e' lateral: 5.7 LV SV:         78 LV SV Index:   34 LVOT Area:     3.80 cm        3D Volume EF                                LV 3D EF:    Left                                             ventricul LV Volumes (MOD)                            ar LV vol d, MOD    142.0 ml                   ejection A2C:                                        fraction LV vol d, MOD    122.0 ml                   by 3D A4C:                                        volume is LV vol s, MOD    76.1 ml                    44 %. A2C: LV vol s, MOD    63.3 ml A4C:                           3D Volume EF: LV SV MOD A2C:   65.9 ml       3D EF:        44 % LV SV MOD A4C:   122.0 ml      LV EDV:       183 ml LV SV MOD BP:    64.7 ml       LV ESV:       103 ml  LV SV:        80 ml RIGHT VENTRICLE             IVC RV Basal diam:  3.10 cm     IVC diam: 2.70 cm RV S prime:     13.20 cm/s TAPSE (M-mode): 2.6 cm LEFT ATRIUM           Index       RIGHT ATRIUM           Index LA diam:      2.60 cm 1.13 cm/m  RA Area:     16.20 cm LA Vol (A2C): 44.9 ml 19.51 ml/m RA Volume:   46.80 ml  20.34 ml/m LA Vol (A4C): 40.3 ml 17.51 ml/m  AORTIC VALVE AV Area (Vmax):    2.62 cm AV Area  (Vmean):   2.63 cm AV Area (VTI):     2.73 cm AV Vmax:           148.00 cm/s AV Vmean:          99.100 cm/s AV VTI:            0.284 m AV Peak Grad:      8.8 mmHg AV Mean Grad:      5.0 mmHg LVOT Vmax:         102.00 cm/s LVOT Vmean:        68.600 cm/s LVOT VTI:          0.204 m LVOT/AV VTI ratio: 0.72  AORTA Ao Root diam: 3.20 cm Ao Asc diam:  3.30 cm MITRAL VALVE MV Area (PHT): 2.69 cm    SHUNTS MV Area VTI:   3.00 cm    Systemic VTI:  0.20 m MV Peak grad:  3.7 mmHg    Systemic Diam: 2.20 cm MV Mean grad:  1.0 mmHg MV Vmax:       0.96 m/s MV Vmean:      50.3 cm/s MV Decel Time: 282 msec MV E velocity: 52.50 cm/s MV A velocity: 70.60 cm/s MV E/A ratio:  0.74 Annette Nahser MD Electronically signed by Annette Nahser MD Signature Date/Time: 07/30/2021/12:11:31 PM    Final     Cardiac Studies   Echo from 07/30/21:  1. Left ventricular ejection fraction by 3D volume is 44 %. The left  ventricle has mildly decreased function. The left ventricle demonstrates  regional wall motion abnormalities (see scoring diagram/findings for  description). The left ventricular  internal cavity size was mildly dilated. There is severe asymmetric left  ventricular hypertrophy. Left ventricular diastolic parameters are  consistent with Grade I diastolic dysfunction (impaired relaxation). There  is severe akinesis of the left  ventricular, basal-mid inferior wall.   2. Right ventricular systolic function is normal. The right ventricular  size is normal.   3. The mitral valve is normal in structure. Mild mitral valve  regurgitation.   4. The aortic valve is grossly normal. Aortic valve regurgitation is not  visualized. No aortic stenosis is present.   Patient Profile     59 y.o. female with PMH of HTN, HLD, morbid obesity, OSA on CPAP, COPD, anxiety with depression, who presented to [facility] ER on 07/29/21 for emesis, diarrhea after eating some cookies. She had 2 days of chest pain radiating to right elbow prior to that.  Hs trop 17854 > 17022. EKG showed ST with HR 101bpm, frequent PACs, anterior Q waves (present on prior tracing) and ST elevation of lateral leads and  ST depression of   inferior leads. She was started on heparin gtt, transferred to [facility] for cardiac cath on Monday.   Assessment & Plan    STEMI Ischemic cardiomyopathy - Previous cardiac workup from 12/2013 for dyspnea evaluation include negative nuclear stress test and Echo with EF 55-60% with normal WM - presented late to [facility] with >48-72 hours of chest pain to right elbow, nausea, emesis, diarrhea  - Hs trop 17854 > 17022 - EKG showed ST with HR 101bpm, frequent PACs, anterior Q waves (present on prior tracing) and ST elevation of lateral leads and ST depression of inferior leads. - CXR no acute findings  - Echo 07/30/21 showed EF 44%, LV mild dilation, severe asymmetric LVH, grade I DD, severe akinesis of the left ventricular, basal-mid inferior wall, mild MR (EF dropped from 55 to 60% in 2015) - LDL 112 , A1C 6.4% from 07/29/21  - medical therapy: continue heparin and nitro gtt until cath, started on ASA 81 mg and metoprolol 25mg  BID, switched lipitor 40mg  to Crestor 20mg  during this admission - Cardiac cath is planned today 07/31/21, dispo plan per cath   HTN - BP elevated at admission to 157/92, not on antihypertensive at home, started on metoprolol as above, BP controlled now    HLD - LDL 112 , switched lipitor 40mg  to Crestor 20mg  this admission  OSA  - continue CPAP nightly   COPD -Without exacerbation, continue as needed albuterol  Obesity  -Discussed lifestyle modification  Anxiety with depression -Home dose Xanax continued    For questions or updates, please contact [facility] Please consult [email or web address] for contact info under        Signed, , NP  07/31/2021, 10:01 AM    Patient seen, examined. Available data reviewed. Agree with findings, assessment, and plan as outlined by 08/02/21, NP.  Patient  independently interviewed and examined.  Her son is at the bedside.  She has had no chest pain or shortness of breath during her hospitalization.  I have reviewed her EKG tracings and lab data confirming myocardial infarction with troponin approximately 17,000.  EKG suggests a recent lateral wall infarction.  Reviewed indications for cardiac catheterization and PCI with the patient.  See previous notes for full discussion.  On my exam, she is alert, oriented, no distress.  She is moderately anxious.  HEENT is normal, JVP is difficult to visualize but probably normal, lungs are clear, heart is regular rate and rhythm no murmur gallop, abdomen soft nontender, extremities have no edema.  Right radial pulses 2+.  Medications reviewed and will continue current therapy.  Further plans/disposition pending her cardiac catheterization results.  All of their questions were answered.  , M.D. 07/31/2021 10:28 AM

## 2021-07-30 NOTE — Progress Notes (Addendum)
Progress Note  Patient Name: Annette KearnsSandra A Bryant Date of Encounter: 07/31/2021  [facility] Cardiologist:  New to Dr. Elease HashimotoNahser   Subjective   Patient states she is feeling, has not had any chest pain since admission, reports resolved diarrhea and emesis, denied any abdominal pain, oliguria.    Inpatient Medications    Scheduled Meds:  ALPRAZolam  1 mg Oral BID   aspirin EC  81 mg Oral Daily   metoprolol tartrate  25 mg Oral BID   rosuvastatin  20 mg Oral Daily   sodium chloride flush  3 mL Intravenous Q12H   Continuous Infusions:  sodium chloride     sodium chloride 1 mL/kg/hr (07/31/21 0853)   heparin 1,900 Units/hr (07/31/21 0402)   nitroGLYCERIN 5 mcg/min (07/29/21 1217)   PRN Meds: sodium chloride, acetaminophen, nitroGLYCERIN, ondansetron (ZOFRAN) IV, sodium chloride flush   Vital Signs    Vitals:   07/30/21 2126 07/31/21 0008 07/31/21 0358 07/31/21 0834  BP: 131/84 117/86 128/88 116/76  Pulse: 91 80 89 83  Resp: 18 18 18    Temp:  99.5 F (37.5 C) 98.8 F (37.1 C)   TempSrc:  Axillary Oral   SpO2: 93% 94% 97%   Weight:   124.7 kg   Height:        Intake/Output Summary (Last 24 hours) at 07/31/2021 1001 Last data filed at 07/31/2021 0403 Gross per 24 hour  Intake 600 ml  Output 1100 ml  Net -500 ml   Last 3 Weights 07/31/2021 07/29/2021 07/29/2021  Weight (lbs) 275 lb 271 lb 271 lb 8 oz  Weight (kg) 124.739 kg 122.925 kg 123.152 kg  Some encounter information is confidential and restricted. Go to Review Flowsheets activity to see all data.      Telemetry    Sinus rhythm 60-80s, occasional dropped QRS noted  - Personally Reviewed  ECG    EKG today showed SR, similar ST elevation of lateral leads and ST depression of inferolateral leads.  - Personally Reviewed  Physical Exam   GEN: No acute distress.  Obese.  Neck: Short and thick neck Cardiac: RRR, no murmurs, rubs, or gallops.  Respiratory: Clear to auscultation bilaterally. On room air. Speaks  in full sentence  GI: Soft, nontender, non-distended  MS: No BLE edema; No deformity. Radial pulse 2+ bilaterally Neuro:  A and oriented x3, follow commands appropriately   Psych: Mild anxiety   Labs    High Sensitivity Troponin:   Recent Labs  Lab 07/29/21 0130 07/29/21 0333  TROPONINIHS 17,854* 17,022*      Chemistry Recent Labs  Lab 07/28/21 2320 07/30/21 0629 07/31/21 0724  NA 135 136 136  K 3.4* 3.7 3.5  CL 100 100 102  CO2 27 28 27   GLUCOSE 139* 135* 107*  BUN 11 11 11   CREATININE 0.91 1.18* 0.90  CALCIUM 9.0 8.9 8.6*  PROT 8.9*  --   --   ALBUMIN 4.3  --   --   AST 114*  --   --   ALT 26  --   --   ALKPHOS 138*  --   --   BILITOT 1.1  --   --   GFRNONAA >60 54* >60  ANIONGAP 8 8 7      Hematology Recent Labs  Lab 07/28/21 2320 07/30/21 0629 07/31/21 0339  WBC 14.1* 13.1* 14.0*  RBC 5.56* 4.67 4.35  HGB 14.9 12.6 11.7*  HCT 47.1* 39.4 36.7  MCV 84.7 84.4 84.4  MCH 26.8 27.0 26.9  MCHC 31.6 32.0 31.9  RDW 15.9* 15.9* 15.6*  PLT 303 231 224    BNPNo results for input(s): BNP, PROBNP in the last 168 hours.   DDimer No results for input(s): DDIMER in the last 168 hours.   Radiology    ECHOCARDIOGRAM COMPLETE  Result Date: 07/30/2021    ECHOCARDIOGRAM REPORT   Patient Name:   Annette Bryant Date of Exam: 07/30/2021 Medical Rec #:  [id number]       Height:       67.0 in Accession #:    [id number]      Weight:       271.0 lb Date of Birth:  [birth year]       BSA:          2.301 m Patient Age:    59 years        BP:           111/67 mmHg Patient Gender: F               HR:           70 bpm. Exam Location:  Inpatient Procedure: 2D Echo, 3D Echo, Cardiac Doppler and Color Doppler Indications:    NSTEMI  History:        Patient has prior history of Echocardiogram examinations, most                 recent 12/11/2013. COPD; Risk Factors:Hypertension, Dyslipidemia                 and Sleep Apnea.  Sonographer:    Ross Ludwig RDCS (AE) Referring Phys: [phone] JILL D  MCDANIEL IMPRESSIONS  1. Left ventricular ejection fraction by 3D volume is 44 %. The left ventricle has mildly decreased function. The left ventricle demonstrates regional wall motion abnormalities (see scoring diagram/findings for description). The left ventricular internal cavity size was mildly dilated. There is severe asymmetric left ventricular hypertrophy. Left ventricular diastolic parameters are consistent with Grade I diastolic dysfunction (impaired relaxation). There is severe akinesis of the left ventricular, basal-mid inferior wall.  2. Right ventricular systolic function is normal. The right ventricular size is normal.  3. The mitral valve is normal in structure. Mild mitral valve regurgitation.  4. The aortic valve is grossly normal. Aortic valve regurgitation is not visualized. No aortic stenosis is present. FINDINGS  Left Ventricle: Left ventricular ejection fraction by 3D volume is 44 %. The left ventricle has mildly decreased function. The left ventricle demonstrates regional wall motion abnormalities. Severe akinesis of the left ventricular, basal-mid inferior wall. The left ventricular internal cavity size was mildly dilated. There is severe asymmetric left ventricular hypertrophy. Left ventricular diastolic parameters are consistent with Grade I diastolic dysfunction (impaired relaxation). Right Ventricle: The right ventricular size is normal. Right vetricular wall thickness was not well visualized. Right ventricular systolic function is normal. Left Atrium: Left atrial size was normal in size. Right Atrium: Right atrial size was normal in size. Pericardium: There is no evidence of pericardial effusion. Mitral Valve: The mitral valve is normal in structure. Mild mitral valve regurgitation. MV peak gradient, 3.7 mmHg. The mean mitral valve gradient is 1.0 mmHg. Tricuspid Valve: The tricuspid valve is normal in structure. Tricuspid valve regurgitation is not demonstrated. Aortic Valve: The aortic  valve is grossly normal. Aortic valve regurgitation is not visualized. No aortic stenosis is present. Aortic valve mean gradient measures 5.0 mmHg. Aortic valve peak gradient measures 8.8 mmHg. Aortic valve area, by VTI measures 2.73  cm. Pulmonic Valve: The pulmonic valve was grossly normal. Pulmonic valve regurgitation is not visualized. Aorta: The aortic root and ascending aorta are structurally normal, with no evidence of dilitation. IAS/Shunts: The atrial septum is grossly normal.  LEFT VENTRICLE PLAX 2D LVIDd:         5.10 cm         Diastology LVIDs:         3.70 cm         LV e' medial:    6.31 cm/s LV PW:         1.00 cm         LV E/e' medial:  8.3 LV IVS:        1.90 cm         LV e' lateral:   9.25 cm/s LVOT diam:     2.20 cm         LV E/e' lateral: 5.7 LV SV:         78 LV SV Index:   34 LVOT Area:     3.80 cm        3D Volume EF                                LV 3D EF:    Left                                             ventricul LV Volumes (MOD)                            ar LV vol d, MOD    142.0 ml                   ejection A2C:                                        fraction LV vol d, MOD    122.0 ml                   by 3D A4C:                                        volume is LV vol s, MOD    76.1 ml                    44 %. A2C: LV vol s, MOD    63.3 ml A4C:                           3D Volume EF: LV SV MOD A2C:   65.9 ml       3D EF:        44 % LV SV MOD A4C:   122.0 ml      LV EDV:       183 ml LV SV MOD BP:    64.7 ml       LV ESV:       103 ml  LV SV:        80 ml RIGHT VENTRICLE             IVC RV Basal diam:  3.10 cm     IVC diam: 2.70 cm RV S prime:     13.20 cm/s TAPSE (M-mode): 2.6 cm LEFT ATRIUM           Index       RIGHT ATRIUM           Index LA diam:      2.60 cm 1.13 cm/m  RA Area:     16.20 cm LA Vol (A2C): 44.9 ml 19.51 ml/m RA Volume:   46.80 ml  20.34 ml/m LA Vol (A4C): 40.3 ml 17.51 ml/m  AORTIC VALVE AV Area (Vmax):    2.62 cm AV Area  (Vmean):   2.63 cm AV Area (VTI):     2.73 cm AV Vmax:           148.00 cm/s AV Vmean:          99.100 cm/s AV VTI:            0.284 m AV Peak Grad:      8.8 mmHg AV Mean Grad:      5.0 mmHg LVOT Vmax:         102.00 cm/s LVOT Vmean:        68.600 cm/s LVOT VTI:          0.204 m LVOT/AV VTI ratio: 0.72  AORTA Ao Root diam: 3.20 cm Ao Asc diam:  3.30 cm MITRAL VALVE MV Area (PHT): 2.69 cm    SHUNTS MV Area VTI:   3.00 cm    Systemic VTI:  0.20 m MV Peak grad:  3.7 mmHg    Systemic Diam: 2.20 cm MV Mean grad:  1.0 mmHg MV Vmax:       0.96 m/s MV Vmean:      50.3 cm/s MV Decel Time: 282 msec MV E velocity: 52.50 cm/s MV A velocity: 70.60 cm/s MV E/A ratio:  0.74 Kristeen Miss MD Electronically signed by Kristeen Miss MD Signature Date/Time: 07/30/2021/12:11:31 PM    Final     Cardiac Studies   Echo from 07/30/21:  1. Left ventricular ejection fraction by 3D volume is 44 %. The left  ventricle has mildly decreased function. The left ventricle demonstrates  regional wall motion abnormalities (see scoring diagram/findings for  description). The left ventricular  internal cavity size was mildly dilated. There is severe asymmetric left  ventricular hypertrophy. Left ventricular diastolic parameters are  consistent with Grade I diastolic dysfunction (impaired relaxation). There  is severe akinesis of the left  ventricular, basal-mid inferior wall.   2. Right ventricular systolic function is normal. The right ventricular  size is normal.   3. The mitral valve is normal in structure. Mild mitral valve  regurgitation.   4. The aortic valve is grossly normal. Aortic valve regurgitation is not  visualized. No aortic stenosis is present.   Patient Profile     60 y.o. female with PMH of HTN, HLD, morbid obesity, OSA on CPAP, COPD, anxiety with depression, who presented to [facility] ER on 07/29/21 for emesis, diarrhea after eating some cookies. She had 2 days of chest pain radiating to right elbow prior to that.  Hs trop 17854 > 17022. EKG showed ST with HR 101bpm, frequent PACs, anterior Q waves (present on prior tracing) and ST elevation of lateral leads and  ST depression of  inferior leads. She was started on heparin gtt, transferred to [facility] for cardiac cath on Monday.   Assessment & Plan    STEMI Ischemic cardiomyopathy - Previous cardiac workup from 12/2013 for dyspnea evaluation include negative nuclear stress test and Echo with EF 55-60% with normal WM - presented late to [facility] with >48-72 hours of chest pain to right elbow, nausea, emesis, diarrhea  - Hs trop 17854 > 17022 - EKG showed ST with HR 101bpm, frequent PACs, anterior Q waves (present on prior tracing) and ST elevation of lateral leads and ST depression of inferior leads. - CXR no acute findings  - Echo 07/30/21 showed EF 44%, LV mild dilation, severe asymmetric LVH, grade I DD, severe akinesis of the left ventricular, basal-mid inferior wall, mild MR (EF dropped from 55 to 60% in 2015) - LDL 112 , A1C 6.4% from 07/29/21  - medical therapy: continue heparin and nitro gtt until cath, started on ASA 81 mg and metoprolol 25mg  BID, switched lipitor 40mg  to Crestor 20mg  during this admission - Cardiac cath is planned today 07/31/21, dispo plan per cath   HTN - BP elevated at admission to 157/92, not on antihypertensive at home, started on metoprolol as above, BP controlled now    HLD - LDL 112 , switched lipitor 40mg  to Crestor 20mg  this admission  OSA  - continue CPAP nightly   COPD -Without exacerbation, continue as needed albuterol  Obesity  -Discussed lifestyle modification  Anxiety with depression -Home dose Xanax continued    For questions or updates, please contact [facility] Please consult [email or web address] for contact info under        Signed, , NP  07/31/2021, 10:01 AM    Patient seen, examined. Available data reviewed. Agree with findings, assessment, and plan as outlined by 08/02/21, NP.  Patient  independently interviewed and examined.  Her son is at the bedside.  She has had no chest pain or shortness of breath during her hospitalization.  I have reviewed her EKG tracings and lab data confirming myocardial infarction with troponin approximately 17,000.  EKG suggests a recent lateral wall infarction.  Reviewed indications for cardiac catheterization and PCI with the patient.  See previous notes for full discussion.  On my exam, she is alert, oriented, no distress.  She is moderately anxious.  HEENT is normal, JVP is difficult to visualize but probably normal, lungs are clear, heart is regular rate and rhythm no murmur gallop, abdomen soft nontender, extremities have no edema.  Right radial pulses 2+.  Medications reviewed and will continue current therapy.  Further plans/disposition pending her cardiac catheterization results.  All of their questions were answered.  , M.D. 07/31/2021 10:28 AM

## 2021-07-30 NOTE — Progress Notes (Signed)
ANTICOAGULATION CONSULT NOTE - Follow-up Consult  Pharmacy Consult for Heparin Indication: chest pain/ACS  No Known Allergies  Patient Measurements: Height: 5\' 7"  (170.2 cm) Weight: 122.9 kg (271 lb) IBW/kg (Calculated) : 61.6 Heparin Dosing Weight: 90 kg  Vital Signs: Temp: 98.5 F (36.9 C) (08/21 0737) Temp Source: Oral (08/21 0737) BP: 111/67 (08/21 0737) Pulse Rate: 85 (08/21 0737)  Labs: Recent Labs    07/28/21 2320 07/29/21 0130 07/29/21 0333 07/29/21 0837 07/29/21 1635 07/30/21 0629  HGB 14.9  --   --   --   --  12.6  HCT 47.1*  --   --   --   --  39.4  PLT 303  --   --   --   --  231  HEPARINUNFRC  --   --   --  <0.10* 0.27* 0.31  CREATININE 0.91  --   --   --   --  1.18*  TROPONINIHS  --  08/01/21* 17,022*  --   --   --      Estimated Creatinine Clearance: 71.5 mL/min (A) (by C-G formula based on SCr of 1.18 mg/dL (H)).   Medical History: Past Medical History:  Diagnosis Date   Anxiety    Arthritis    Asthma    Carpal tunnel syndrome    COPD (chronic obstructive pulmonary disease) (HCC)    Depression    Dyspnea    a. Adm 12/2013 for dyspnea/palps: normal stress-pics-only Lexiscan nuc, echo unremarkable with normal EF, d-dimer normal, ruled out for MI.   Elevated lipids    HLD (hyperlipidemia)    Hyperglycemia    Hypertension    Morbid obesity (HCC)    Panic attacks    Sleep apnea    uses CPAP    Medications:  No anticoagulants prior to admission   Assessment: Annette Bryant is a 60 y.o. female presented with emesis/diarrhea at [facility] ED. During ED stay, trop returned back at 17,800, pt with no complaints of chest pain. Pt was transferred to [facility] for further cardiac work-up. Pharmacy asked to dose heparin for ACS.   Heparin level 0.31 and therapeutic on lower end of goal while on 1650 units/h. Will plan to do slight increase to keep her at goal. H/H and PLT are stable and within normal limits. No signs of bleeding or IV site issues per RN.    Goal of Therapy:  Heparin level 0.3-0.7 units/ml Monitor platelets by anticoagulation protocol: Yes   Plan:  Increase IV heparin gtt to 1700 units/h Daily heparin level and CBC Monitor for signs and symptoms of bleeding  Thank you for involving pharmacy in this patient's care.  [facility], PharmD PGY1 Ambulatory Care Pharmacy Resident 07/30/2021 9:02 AM  **Pharmacist phone directory can be found on [email or web address] listed under [facility]**

## 2021-07-31 ENCOUNTER — Encounter (HOSPITAL_COMMUNITY): Payer: Self-pay | Admitting: Cardiovascular Disease

## 2021-07-31 ENCOUNTER — Encounter (HOSPITAL_COMMUNITY)
Admission: EM | Disposition: A | Discharge status: Home / Self Care | Payer: Self-pay | Source: Home / Self Care | Attending: Cardiovascular Disease

## 2021-07-31 DIAGNOSIS — I2510 Atherosclerotic heart disease of native coronary artery without angina pectoris: Secondary | ICD-10-CM

## 2021-07-31 DIAGNOSIS — E785 Hyperlipidemia, unspecified: Secondary | ICD-10-CM

## 2021-07-31 HISTORY — PX: CORONARY STENT INTERVENTION: CATH118234

## 2021-07-31 HISTORY — PX: LEFT HEART CATH AND CORONARY ANGIOGRAPHY: CATH118249

## 2021-07-31 LAB — BASIC METABOLIC PANEL
Anion gap: 7 (ref 5–15)
BUN: 11 mg/dL (ref 6–20)
CO2: 27 mmol/L (ref 22–32)
Calcium: 8.6 mg/dL — ABNORMAL LOW (ref 8.9–10.3)
Chloride: 102 mmol/L (ref 98–111)
Creatinine, Ser: 0.9 mg/dL (ref 0.44–1.00)
GFR, Estimated: >60 mL/min (ref >60)
Glucose, Bld: 107 mg/dL — ABNORMAL HIGH (ref 70–99)
Potassium: 3.5 mmol/L (ref 3.5–5.1)
Sodium: 136 mmol/L (ref 135–145)

## 2021-07-31 LAB — CBC
HCT: 36.7 % (ref 36.0–46.0)
Hemoglobin: 11.7 g/dL — ABNORMAL LOW (ref 12.0–15.0)
MCH: 26.9 pg (ref 26.0–34.0)
MCHC: 31.9 g/dL (ref 30.0–36.0)
MCV: 84.4 fL (ref 80.0–100.0)
Platelets: 224 10*3/uL (ref 150–400)
RBC: 4.35 MIL/uL (ref 3.87–5.11)
RDW: 15.6 % — ABNORMAL HIGH (ref 11.5–15.5)
WBC: 14 10*3/uL — ABNORMAL HIGH (ref 4.0–10.5)
nRBC: 0 % (ref 0.0–0.2)

## 2021-07-31 LAB — POCT ACTIVATED CLOTTING TIME
Activated Clotting Time: 271 seconds
Activated Clotting Time: 283 seconds

## 2021-07-31 LAB — HEPARIN LEVEL (UNFRACTIONATED): Heparin Unfractionated: 0.21 IU/mL — ABNORMAL LOW (ref 0.30–0.70)

## 2021-07-31 SURGERY — LEFT HEART CATH AND CORONARY ANGIOGRAPHY
Anesthesia: LOCAL

## 2021-07-31 MED ORDER — LIDOCAINE HCL (PF) 1 % IJ SOLN
INTRAMUSCULAR | Status: AC
  Filled 2021-07-31: qty 30

## 2021-07-31 MED ORDER — HEPARIN (PORCINE) IN NACL 1000-0.9 UT/500ML-% IV SOLN
INTRAVENOUS | Status: DC | PRN
  Administered 2021-07-31 (×2): 500 mL

## 2021-07-31 MED ORDER — FENTANYL CITRATE (PF) 100 MCG/2ML IJ SOLN
INTRAMUSCULAR | Status: AC
  Filled 2021-07-31: qty 2

## 2021-07-31 MED ORDER — VERAPAMIL HCL 2.5 MG/ML IV SOLN
INTRAVENOUS | Status: DC | PRN
  Administered 2021-07-31: 10 mL via INTRA_ARTERIAL

## 2021-07-31 MED ORDER — VERAPAMIL HCL 2.5 MG/ML IV SOLN
INTRAVENOUS | Status: AC
  Filled 2021-07-31: qty 2

## 2021-07-31 MED ORDER — LIDOCAINE HCL (PF) 1 % IJ SOLN
INTRAMUSCULAR | Status: DC | PRN
  Administered 2021-07-31: 2 mL

## 2021-07-31 MED ORDER — HEPARIN SODIUM (PORCINE) 1000 UNIT/ML IJ SOLN
INTRAMUSCULAR | Status: AC
  Filled 2021-07-31: qty 1

## 2021-07-31 MED ORDER — ALBUTEROL SULFATE (2.5 MG/3ML) 0.083% IN NEBU: 2.5000 mg | INHALATION_SOLUTION | Freq: Four times a day (QID) | RESPIRATORY_TRACT | Status: DC | PRN

## 2021-07-31 MED ORDER — POTASSIUM CHLORIDE CRYS ER 20 MEQ PO TBCR
40.0000 meq | EXTENDED_RELEASE_TABLET | Freq: Once | ORAL | Status: AC
  Administered 2021-07-31: 40 meq via ORAL
  Filled 2021-07-31: qty 2

## 2021-07-31 MED ORDER — LABETALOL HCL 5 MG/ML IV SOLN: 10.0000 mg | INTRAVENOUS | Status: AC | PRN

## 2021-07-31 MED ORDER — HEPARIN (PORCINE) IN NACL 1000-0.9 UT/500ML-% IV SOLN
INTRAVENOUS | Status: AC
  Filled 2021-07-31: qty 1000

## 2021-07-31 MED ORDER — TICAGRELOR 90 MG PO TABS
ORAL_TABLET | ORAL | Status: DC | PRN
  Administered 2021-07-31: 180 mg via ORAL

## 2021-07-31 MED ORDER — FUROSEMIDE 10 MG/ML IJ SOLN
40.0000 mg | Freq: Once | INTRAMUSCULAR | Status: AC
  Administered 2021-07-31: 40 mg via INTRAVENOUS
  Filled 2021-07-31: qty 4

## 2021-07-31 MED ORDER — TICAGRELOR 90 MG PO TABS
ORAL_TABLET | ORAL | Status: AC
  Filled 2021-07-31: qty 2

## 2021-07-31 MED ORDER — SODIUM CHLORIDE 0.9 % IV SOLN: 250.0000 mL | INTRAVENOUS | Status: DC | PRN

## 2021-07-31 MED ORDER — ALPRAZOLAM 0.5 MG PO TABS
1.0000 mg | ORAL_TABLET | Freq: Once | ORAL | Status: DC
  Filled 2021-07-31: qty 2

## 2021-07-31 MED ORDER — SODIUM CHLORIDE 0.9% FLUSH
3.0000 mL | Freq: Two times a day (BID) | INTRAVENOUS | Status: DC
  Administered 2021-07-31: 3 mL via INTRAVENOUS

## 2021-07-31 MED ORDER — SODIUM CHLORIDE 0.9 % IV SOLN: INTRAVENOUS | Status: AC

## 2021-07-31 MED ORDER — HYDRALAZINE HCL 20 MG/ML IJ SOLN: 10.0000 mg | INTRAMUSCULAR | Status: AC | PRN

## 2021-07-31 MED ORDER — SODIUM CHLORIDE 0.9% FLUSH: 3.0000 mL | INTRAVENOUS | Status: DC | PRN

## 2021-07-31 MED ORDER — HEPARIN SODIUM (PORCINE) 1000 UNIT/ML IJ SOLN
INTRAMUSCULAR | Status: DC | PRN
  Administered 2021-07-31 (×2): 6000 [IU] via INTRAVENOUS
  Administered 2021-07-31: 3000 [IU] via INTRAVENOUS

## 2021-07-31 MED ORDER — FENTANYL CITRATE (PF) 100 MCG/2ML IJ SOLN
INTRAMUSCULAR | Status: DC | PRN
  Administered 2021-07-31: 50 ug via INTRAVENOUS

## 2021-07-31 MED ORDER — MIDAZOLAM HCL 2 MG/2ML IJ SOLN
INTRAMUSCULAR | Status: AC
  Filled 2021-07-31: qty 2

## 2021-07-31 MED ORDER — TICAGRELOR 90 MG PO TABS
90.0000 mg | ORAL_TABLET | Freq: Two times a day (BID) | ORAL | Status: DC
  Administered 2021-07-31 – 2021-08-01 (×2): 90 mg via ORAL
  Filled 2021-07-31 (×2): qty 1

## 2021-07-31 MED ORDER — MIDAZOLAM HCL 2 MG/2ML IJ SOLN
INTRAMUSCULAR | Status: DC | PRN
  Administered 2021-07-31: 2 mg via INTRAVENOUS

## 2021-07-31 MED ORDER — IOHEXOL 350 MG/ML SOLN
INTRAVENOUS | Status: DC | PRN
  Administered 2021-07-31: 130 mL

## 2021-07-31 SURGICAL SUPPLY — 23 items
BALLN SAPPHIRE 2.0X12 (BALLOONS) ×2
BALLN ~~LOC~~ EUPHORA RX 3.25X15 (BALLOONS) ×2
BALLOON SAPPHIRE 2.0X12 (BALLOONS) IMPLANT
BALLOON ~~LOC~~ EUPHORA RX 3.25X15 (BALLOONS) IMPLANT
CATH 5FR JL3.5 JR4 ANG PIG MP (CATHETERS) ×1 IMPLANT
CATH VISTA GUIDE 6FR JR4 (CATHETERS) ×1 IMPLANT
CATH VISTA GUIDE 6FR XBLAD3.5 (CATHETERS) ×1 IMPLANT
DEVICE RAD COMP TR BAND LRG (VASCULAR PRODUCTS) ×1 IMPLANT
ELECT DEFIB PAD ADLT CADENCE (PAD) ×1 IMPLANT
GLIDESHEATH SLEND SS 6F .021 (SHEATH) ×2 IMPLANT
GUIDEWIRE INQWIRE 1.5J.035X260 (WIRE) IMPLANT
INQWIRE 1.5J .035X260CM (WIRE) ×8
KIT ENCORE 26 ADVANTAGE (KITS) ×1 IMPLANT
KIT HEART LEFT (KITS) ×2 IMPLANT
PACK CARDIAC CATHETERIZATION (CUSTOM PROCEDURE TRAY) ×2 IMPLANT
SHEATH PROBE COVER 6X72 (BAG) ×1 IMPLANT
STENT SYNERGY XD 3.0X28 (Permanent Stent) IMPLANT
SYNERGY XD 3.0X28 (Permanent Stent) ×2 IMPLANT
TRANSDUCER W/STOPCOCK (MISCELLANEOUS) ×2 IMPLANT
TUBING CIL FLEX 10 FLL-RA (TUBING) ×2 IMPLANT
WIRE ASAHI FIELDER XT 190CM (WIRE) ×1 IMPLANT
WIRE COUGAR XT STRL 190CM (WIRE) ×1 IMPLANT
WIRE HI TORQ WHISPER MS 190CM (WIRE) ×1 IMPLANT

## 2021-07-31 NOTE — Progress Notes (Signed)
ANTICOAGULATION CONSULT NOTE - Follow-up Consult  Pharmacy Consult for Heparin Indication: chest pain/ACS  No Known Allergies  Patient Measurements: Height: 5\' 7"  (170.2 cm) Weight: 122.9 kg (271 lb) IBW/kg (Calculated) : 61.6 Heparin Dosing Weight: 90 kg  Vital Signs: Temp: 99.5 F (37.5 C) (08/22 0008) Temp Source: Axillary (08/22 0008) BP: 117/86 (08/22 0008) Pulse Rate: 80 (08/22 0008)  Labs: Recent Labs    07/28/21 2320 07/29/21 0130 07/29/21 0333 07/29/21 0837 07/29/21 1635 07/30/21 0629 07/31/21 0339  HGB 14.9  --   --   --   --  12.6 11.7*  HCT 47.1*  --   --   --   --  39.4 36.7  PLT 303  --   --   --   --  231 224  HEPARINUNFRC  --   --   --    < > 0.27* 0.31 0.21*  CREATININE 0.91  --   --   --   --  1.18*  --   TROPONINIHS  --  08/02/21* 17,022*  --   --   --   --    < > = values in this interval not displayed.     Estimated Creatinine Clearance: 71.5 mL/min (A) (by C-G formula based on SCr of 1.18 mg/dL (H)).   Assessment: Annette Bryant is a 60 y.o. female presented with emesis/diarrhea at [facility] ED. During ED stay, trop returned back at 17,800, pt with no complaints of chest pain. Pt was transferred to [facility] for further cardiac work-up. Pharmacy asked to dose heparin for ACS.   Heparin level subtherapeutic (0.21) on gtt at 1700 units/hr. No issues with line or bleeding reported per RN. Hgb down some to 11.7. Plan for cath today.  Goal of Therapy:  Heparin level 0.3-0.7 units/ml Monitor platelets by anticoagulation protocol: Yes   Plan:  Increase IV heparin gtt to 1900 units/h F/u 6hr heparin level vs f/u post cath  Thank you for involving pharmacy in this patient's care.  [facility], PharmD, BCPS Please see [email or web address] for complete clinical pharmacist phone list 07/31/2021 3:58 AM

## 2021-07-31 NOTE — Interval H&P Note (Signed)
History and Physical Interval Note:  07/31/2021 11:35 AM  Annette Bryant  has presented today for surgery, with the diagnosis of NSTEMI.  The various methods of treatment have been discussed with the patient and family. After consideration of risks, benefits and other options for treatment, the patient has consented to  Procedure(s): LEFT HEART CATH AND CORONARY ANGIOGRAPHY (N/A) as a surgical intervention.  The patient's history has been reviewed, patient examined, no change in status, stable for surgery.  I have reviewed the patient's chart and labs.  Questions were answered to the patient's satisfaction.    Cath Lab Visit (complete for each Cath Lab visit)  Clinical Evaluation Leading to the Procedure:   ACS: Yes.    Non-ACS:    Anginal Classification: CCS IV  Anti-ischemic medical therapy: No Therapy  Non-Invasive Test Results: No non-invasive testing performed  Prior CABG: No previous CABG        Verne Carrow

## 2021-07-31 NOTE — Plan of Care (Addendum)
RN paged reports patient with c/o SOB, pox 100% on RA, no wheezing, felt maybe anxiety related.   Patient seen in the room, anxious, crying, stating she is a good person and never done anything wrong and does not understand why her heart is diseased. She had refused her xanax this AM. Lungs sound with fine crackles, diminished at base. Cardiac S1S2, RRR. Skin is warm, well perfused, right radial site without bleeding or hematoma.  BLE no edema. Pox 97% RA. RR 18-22.   Cardiac cath reviewed, s/p DES to mid LAD, unsuccessful attempt at PCI of the mid RCA CTO. LVEDP .   Will stop post cath IVF, IV Lasix 40mg  x1 has been ordered by interventional team and is pending administration, re-assess tomorrow to determine need for further diuresis. Will give one time dose of PO Xanax to help patient with anxiety attack. Will add PRN albuterol given underlying COPD, may use for wheezing . Advised RN to continue monitor patient for UOP and dyspnea. Education and support provided at bedside to the patient and family. She is able to calm down, does not appear in acute distress at this time. If all other factors ruled out, dyspnea persistent or worsens, may consider switching Brilinta.

## 2021-08-01 ENCOUNTER — Other Ambulatory Visit (HOSPITAL_COMMUNITY): Payer: Self-pay

## 2021-08-01 DIAGNOSIS — I2510 Atherosclerotic heart disease of native coronary artery without angina pectoris: Secondary | ICD-10-CM

## 2021-08-01 DIAGNOSIS — I255 Ischemic cardiomyopathy: Secondary | ICD-10-CM

## 2021-08-01 DIAGNOSIS — I5041 Acute combined systolic (congestive) and diastolic (congestive) heart failure: Secondary | ICD-10-CM

## 2021-08-01 LAB — BASIC METABOLIC PANEL
Anion gap: 9 (ref 5–15)
BUN: 12 mg/dL (ref 6–20)
CO2: 24 mmol/L (ref 22–32)
Calcium: 8.9 mg/dL (ref 8.9–10.3)
Chloride: 102 mmol/L (ref 98–111)
Creatinine, Ser: 0.91 mg/dL (ref 0.44–1.00)
GFR, Estimated: >60 mL/min (ref >60)
Glucose, Bld: 108 mg/dL — ABNORMAL HIGH (ref 70–99)
Potassium: 3.7 mmol/L (ref 3.5–5.1)
Sodium: 135 mmol/L (ref 135–145)

## 2021-08-01 LAB — CBC
HCT: 36.7 % (ref 36.0–46.0)
Hemoglobin: 12 g/dL (ref 12.0–15.0)
MCH: 27 pg (ref 26.0–34.0)
MCHC: 32.7 g/dL (ref 30.0–36.0)
MCV: 82.7 fL (ref 80.0–100.0)
Platelets: 243 10*3/uL (ref 150–400)
RBC: 4.44 MIL/uL (ref 3.87–5.11)
RDW: 15.4 % (ref 11.5–15.5)
WBC: 12.9 10*3/uL — ABNORMAL HIGH (ref 4.0–10.5)
nRBC: 0 % (ref 0.0–0.2)

## 2021-08-01 MED ORDER — ASPIRIN 81 MG PO TBEC
81.0000 mg | DELAYED_RELEASE_TABLET | Freq: Every day | ORAL | 3 refills | Status: DC
  Filled 2021-08-01: qty 90, 90d supply, fill #0

## 2021-08-01 MED ORDER — NITROGLYCERIN 0.4 MG SL SUBL
0.4000 mg | SUBLINGUAL_TABLET | SUBLINGUAL | 3 refills | Status: DC | PRN
  Filled 2021-08-01: qty 25, 8d supply, fill #0

## 2021-08-01 MED ORDER — ROSUVASTATIN CALCIUM 20 MG PO TABS
40.0000 mg | ORAL_TABLET | Freq: Every day | ORAL | Status: DC
  Administered 2021-08-01: 40 mg via ORAL
  Filled 2021-08-01: qty 2

## 2021-08-01 MED ORDER — EMPAGLIFLOZIN 10 MG PO TABS
10.0000 mg | ORAL_TABLET | Freq: Every day | ORAL | Status: DC
  Administered 2021-08-01: 10 mg via ORAL
  Filled 2021-08-01: qty 1

## 2021-08-01 MED ORDER — TICAGRELOR 90 MG PO TABS
90.0000 mg | ORAL_TABLET | Freq: Two times a day (BID) | ORAL | 3 refills | Status: DC
  Filled 2021-08-01: qty 180, 90d supply, fill #0

## 2021-08-01 MED ORDER — SACUBITRIL-VALSARTAN 24-26 MG PO TABS
1.0000 | ORAL_TABLET | Freq: Two times a day (BID) | ORAL | 6 refills | Status: DC
  Filled 2021-08-01: qty 60, 30d supply, fill #0

## 2021-08-01 MED ORDER — EMPAGLIFLOZIN 10 MG PO TABS
10.0000 mg | ORAL_TABLET | Freq: Every day | ORAL | 6 refills | Status: DC
  Filled 2021-08-01: qty 28, 28d supply, fill #0

## 2021-08-01 MED ORDER — POTASSIUM CHLORIDE CRYS ER 20 MEQ PO TBCR
40.0000 meq | EXTENDED_RELEASE_TABLET | Freq: Once | ORAL | Status: AC
  Administered 2021-08-01: 40 meq via ORAL
  Filled 2021-08-01: qty 2

## 2021-08-01 MED ORDER — ROSUVASTATIN CALCIUM 40 MG PO TABS
40.0000 mg | ORAL_TABLET | Freq: Every day | ORAL | 3 refills | Status: DC
  Filled 2021-08-01: qty 90, 90d supply, fill #0

## 2021-08-01 MED ORDER — METOPROLOL TARTRATE 25 MG PO TABS
25.0000 mg | ORAL_TABLET | Freq: Two times a day (BID) | ORAL | 3 refills | Status: DC
  Filled 2021-08-01: qty 60, 30d supply, fill #0

## 2021-08-01 MED ORDER — SACUBITRIL-VALSARTAN 24-26 MG PO TABS
1.0000 | ORAL_TABLET | Freq: Two times a day (BID) | ORAL | Status: DC
  Administered 2021-08-01: 1 via ORAL
  Filled 2021-08-01: qty 1

## 2021-08-01 NOTE — Discharge Instructions (Addendum)
PLEASE REMEMBER TO BRING ALL OF YOUR MEDICATIONS TO EACH OF YOUR FOLLOW-UP OFFICE VISITS.  PLEASE ATTEND ALL SCHEDULED FOLLOW-UP APPOINTMENTS.   Activity: Increase activity slowly as tolerated. You may shower, but no soaking baths (or swimming) for 1 week. No driving for 24 hours. No lifting over 5 lbs for 1 week. No sexual activity for 1 week.   You May Return to Work: in 1 week (if applicable)  Wound Care: You may wash cath site gently with soap and water. Keep cath site clean and dry. If you notice pain, swelling, bleeding or pus at your cath site, please call [phone].  MEDICATION CHANGES: - START brilinta (ticagrelor) 90mg  two times per day - this medication works to prevent blockages from forming in your stent. It is important that you do not miss doses which could put you at risk for having another heart attack. - START aspirin 81 mg daily  this medication also works to prevent blockages from forming in your stent. It is important that you do not miss doses which could put you at risk for having another heart attack. START nitroglycerin SL - use as needed for chest pain or shortness of breath that does not go away with rest. Take one tab under the tongue if needed, wait 5 minutes and if symptoms are ongoing you can take an additional tab under the tongue. You can take up to 3 tabs in a 15 minute period, though if taking a 3rd tab, please call EMS to get evaluated.  - START Lopressor (metoprolol tartrate) 25mg  two times per day - this medication is important for patient with heart disease and heart failure - START Entresto (sacubitril-Valsartan) 24-26mg  two times per day - this medication works to support your hearts pumping function  - STOP Lipitor (atorvastatin) - this medication is being replaced with crestor - START Crestor (rosuvastatin) 40mg  daily - works to lower your cholesterol -STOP naproxen - it is important that you avoid medications in the NSAID family  - naproxen, ibuprofen,  motrin, BC powder, etc, to minimize bleeding risk when taking aspirin and brilinta - START Jardiance (empagliflozin) 10mg  daily - this is a diabetes medication which also has shown positive effects for patients with heart disease and heart failure  Heart Failure Education: Weigh yourself EVERY morning after you go to the bathroom but before you eat or drink anything. Write this number down in a weight log/diary. If you gain 3 pounds overnight or 5 pounds in a week, call the office. Take your medicines as prescribed. If you have concerns about your medications, please call before you stop taking them.  Eat low salt foods--Limit salt (sodium) to 2000 mg per day. This will help prevent your body from holding onto fluid. Read food labels as many processed foods have a lot of sodium, especially canned goods and prepackaged meats. If you would like some assistance choosing low sodium foods, we would be happy to set you up with a nutritionist. Stay as active as you can everyday. Staying active will give you more energy and make your muscles stronger. Start with 5 minutes at a time and work your way up to 30 minutes a day. Break up your activities--do some in the morning and some in the afternoon. Start with 3 days per week and work your way up to 5 days as you can.  If you have chest pain, feel short of breath, dizzy, or lightheaded, STOP. If you don't feel better after a short rest,  call 911. If you do feel better, call the office to let us know you have symptoms with exercise. Limit all fluids for the day to less than 2 liters. Fluid includes all drinks, coffee, juice, ice chips, soup, jello, and all other liquids.     Information about your medication: Brilinta (anti-platelet agent)  Generic Name (Brand): ticagrelor (Brilinta), twice daily medication  PURPOSE: You are taking this medication along with aspirin to lower your chance of having a heart attack, stroke, or blood clots in your heart stent.  These can be fatal. Brilinta and aspirin help prevent platelets from sticking together and forming a clot that can block an artery or your stent.   Common SIDE EFFECTS you may experience include: bruising or bleeding more easily, shortness of breath  Do not stop taking BRILINTA without talking to the doctor who prescribes it for you. People who are treated with a stent and stop taking Brilinta too soon, have a higher risk of getting a blood clot in the stent, having a heart attack, or dying. If you stop Brilinta because of bleeding, or for other reasons, your risk of a heart attack or stroke may increase.   Tell all of your doctors and dentists that you are taking Brilinta. They should talk to the doctor who prescribed Brilinta for you before you have any surgery or invasive procedure.   Contact your health care provider if you experience: severe or uncontrollable bleeding, pink/red/brown urine, vomiting blood or vomit that looks like "coffee grounds", red or black stools (looks like tar), coughing up blood or blood clots ----------------------------------------------------------------------------------------------------------------------

## 2021-08-01 NOTE — TOC Benefit Eligibility Note (Signed)
Transition of Care [facility]) Benefit Eligibility Note    Patient Details  Name: Annette Bryant MRN: [id number] Date of Birth: [birth year]   Medication/Dose: ENTRESTO 24 - 26 MG BID  Covered?: Yes  Tier: 3 Drug  Prescription Coverage [facility] [place] with Person/Company/Phone Number:: KATHY  @  [employer]  # [phone]  Co-Pay: 25 % OF TOTAL COST  Prior Approval: No  Deductible: Met (OUT-OF-POCKET:UNMET)  Additional Notes: SECONDARY INS : MEDICAID OF [place]  : EFF-DATE: 06-09-2021  , CO-PAY- $ 4.00 FOR EACH  [place]    Memory Argue Phone Number: 08/01/2021, 2:00 PM

## 2021-08-01 NOTE — TOC Benefit Eligibility Note (Addendum)
Patient Product/process development scientist completed.    The patient is currently admitted and upon discharge could be taking Brilinta 90 mg.  The current 30 day co-pay is, $0.00.   The patient is currently admitted and upon discharge could be taking Farxiga 10 mg.  The current 30 day co-pay is, $0.00.   The patient is currently admitted and upon discharge could be taking Jardiance 10 mg.  The current 30 day co-pay is, $0.00.   The patient is currently admitted and upon discharge could be taking Entresto 24/26 mg.  The current 30 day co-pay is, $0.00.   The patient is insured through [employer] Medicare Part D/ [place] Medicaid    Roland Earl, CPhT Pharmacy Patient Advocate Specialist [facility] Antimicrobial Stewardship Team Direct Number: [phone]  Fax: [phone]

## 2021-08-01 NOTE — Progress Notes (Signed)
CARDIAC REHAB PHASE I   PRE:  Rate/Rhythm: 88 SR  BP:  Supine: 117/52  Sitting:   Standing:    SaO2: 97%RA  MODE:  Ambulation: 300 ft   POST:  Rate/Rhythm: 96 SR  BP:  Supine:   Sitting: 124/92  Standing:    SaO2: 97%RA 1010-1115 Returned to see pt to educate and walk. Pt walked 300 ft on RA with slow steady gait stopping frequently to catch her breath. Checked sats several times during walk at 97-98%RA. Encouraged pt to use pursed lip breathing. Pt talking during walk and seems nervous. Emotional support given.  Back to room and pt to sitting on side of bed. Daughter in room asleep. MI education completed with pt . Discussed MI restrictions, NTG use, heart healthy and low sodium food choices, stressed importance of brilinta with stent, and gave walking instructions. Discussed CRP 2 and referred to [facility] program. Pt very sleepy by end of ed. Encouraged her to read materials and to have daughter who is RN look over material also.    Luetta Nutting, RN BSN  08/01/2021 11:10 AM

## 2021-08-01 NOTE — Care Management Important Message (Signed)
Important Message  Patient Details  Name: Annette Bryant MRN: [id number] Date of Birth: 1961/11/01   Medicare Important Message Given:  Yes     Deante Blough Stefan Church 08/01/2021, 4:06 PM

## 2021-08-01 NOTE — Care Management Important Message (Signed)
Important Message  Patient Details  Name: Annette Bryant MRN: [id number] Date of Birth: 09-Feb-1961   Medicare Important Message Given:  Yes     [name] Stefan Church 08/01/2021, 4:01 PM

## 2021-08-01 NOTE — TOC Initial Note (Signed)
Transition of Care [facility]) - Initial/Assessment Note    Patient Details  Name: Annette Bryant MRN: [id number] Date of Birth: 10-23-1961  [facility]) CM/SW Contact:    Lockie Pares, RN Phone Number: 08/01/2021, 11:23 AM  Clinical Narrative:                    Patient presented with chest pain, cath revealed occlusion of right CA,  and left stented proximal LAD. NO further symptoms, will be on low dose entresto, medical management, lifestyle modification, cardiac rehabilitation phase two was recommended. Will DC later today  Expected Discharge Plan: Home/Self Care Barriers to Discharge: No Barriers Identified   Patient Goals and CMS Choice        Expected Discharge Plan and Services Expected Discharge Plan: Home/Self Care                                              Prior Living Arrangements/Services   Lives with:: Self Patient language and need for interpreter reviewed:: Yes        Need for Family Participation in Patient Care: Yes (Comment) Care giver support system in place?: Yes (comment)   Criminal Activity/Legal Involvement Pertinent to Current Situation/Hospitalization: No - Comment as needed  Activities of Daily Living      Permission Sought/Granted                  Emotional Assessment       Orientation: : Oriented to Self, Oriented to  Time, Oriented to Situation, Oriented to Place Alcohol / Substance Use: Not Applicable Psych Involvement: No (comment)  Admission diagnosis:  NSTEMI (non-ST elevated myocardial infarction) (HCC) [I21.4] Nausea vomiting and diarrhea [R11.2, R19.7] Patient Active Problem List   Diagnosis Date Noted   NSTEMI (non-ST elevated myocardial infarction) (HCC) 07/29/2021   Colon cancer screening 09/02/2020   Taking multiple medications for chronic disease 07/10/2018   Right hand pain 01/19/2016   Left hand pain 01/19/2016   Bilateral hand pain 01/19/2016   Morbid obesity (HCC)     Elevated lipids    Hyperglycemia    Hypertension    Depression    Sleep apnea    Dyspnea 12/11/2013   Heart palpitations 12/11/2013   Dizziness 12/11/2013   PCP:  Gareth Morgan, MD Pharmacy[phone] [place] [place] [facility] [address] AT [facility] [address] [place] [address] Phone: [phone] Fax: [phone]  [facility] [address] [place] [address] Phone: [phone] Fax: [phone]     Social Determinants of Health (SDOH) Interventions    Readmission Risk Interventions No flowsheet data found.

## 2021-08-01 NOTE — Discharge Summary (Signed)
Discharge Summary    Patient ID: CAIDYNCE MUZYKA MRN: [id number]; DOB: Nov 23, 1961  Admit date: 07/28/2021 Discharge date: 08/01/2021  PCP:  Gareth Morgan, MD   [facility] Providers Cardiologist:  None        Discharge Diagnoses    Principal Problem:   NSTEMI (non-ST elevated myocardial infarction) [facility]) Active Problems:   Hyperlipidemia LDL goal <70   Hypertension   OSA on CPAP   Ischemic cardiomyopathy   Acute combined systolic and diastolic CHF, NYHA class 2 (HCC)   Coronary artery disease    Diagnostic Studies/Procedures    Echocardiogram 07/30/21: 1. Left ventricular ejection fraction by 3D volume is 44 %. The left  ventricle has mildly decreased function. The left ventricle demonstrates  regional wall motion abnormalities (see scoring diagram/findings for  description). The left ventricular  internal cavity size was mildly dilated. There is severe asymmetric left  ventricular hypertrophy. Left ventricular diastolic parameters are  consistent with Grade I diastolic dysfunction (impaired relaxation). There  is severe akinesis of the left  ventricular, basal-mid inferior wall.   2. Right ventricular systolic function is normal. The right ventricular  size is normal.   3. The mitral valve is normal in structure. Mild mitral valve  regurgitation.   4. The aortic valve is grossly normal. Aortic valve regurgitation is not  visualized. No aortic stenosis is present.   LHC 07/31/21: Mid RCA lesion is 100% stenosed.   Ost Cx to Prox Cx lesion is 40% stenosed.   Mid LAD lesion is 99% stenosed.   Prox RCA lesion is 99% stenosed.   A drug-eluting stent was successfully placed using a SYNERGY XD 3.0X28.   Post intervention, there is a 0% residual stenosis.   The LAD is a large caliber vessel that courses to the apex. The mid LAD is sub-totally occluded.  The Circumflex is a large caliber vessel with moderate ostial stenosis The RCA is a large dominant artery with  chronic occlusion of the mid vessel. The distal vessel fills from right to right bridging collaterals.  Successful PTCA/DES x 1 mid LAD Unsuccessful attempt at PCI of the mid RCA CTO Elevated LVEDP   Recommendations: Continue DAPT with ASA/Brilinta for one year. Continue beta blocker and statin. Will give one dose of IV Lasix today given her elevated filling pressures. She may benefit from ongoing diuresis.    Diagnostic Dominance: Right Intervention   _____________   History of Present Illness     Annette Bryant is a 60 y.o. female with a PMH of HTN, HLD, OSA on CPAP, COPD, anxiety, depression, and morbid obesity. She presented to [facility] after a two day hx of chest pain with radiation to the right elbow with associated nausea and diarrhea that began Thursday evening. Prior to that she was in her usual state of health with no recent chest pain, change in dyspnea, fatigue, LE edema, palpitations or recent illness with fever or chills. She states that her symptoms began as chest and elbow pain then progressed to ongoing nausea and diarrhea. Her symptoms lasted all night Thursday and into Friday. Given their persistency, she presented for further evaluation.     In the ED, HsT found to be markedly elevated at 17854>>>17022. EKG shows ST with HR 101bpm, frequent PACs, anterior Q waves (present on prior tracing) and STE in lead I with STD in leads II, III, AVF. Cr stable at 0.91. K+ slightly low at 3.4. Otherwise labs appear stable. CXR with no edema or consolidation.  She was previously seen by cardiology 12/2013 for the evaluation of dyspnea with palpitations at which time she ruled out for MI. She underwent a stress test and an echocardiogram which were both normal. She has not been seen since that time. She denies personal hx of CAD however has a sister with several MI's and stent placement. She denies tobacco, alcohol or illicit drug use. CV risk factors include HLD, obesity and borderline DM2.    Hospital Course     Consultants: None   1. NSTEMI/late-presenting MI: patient presented with vomiting/diarrhea, as well as CP radiating to her right elbow 2 days prior to admission. She was found to have markedly elevated HsTrop peaked at 17854 and trended down. EKG showed sinus tachycardia with chronic anterior Q waves, STE in lateral leads, and ST depression in inferior leads. Suspicions high for late-presenting MI. She was transported to [facility] for further evaluation. Echocardiogram showed EF 44%, mild LV dilation, severe asymmetric LVH, G1DD, severe akinesis of basal-mid inferior wall, and mild MR. Underwent a LHC 07/31/21 which showed 99% pRCA stenosis followed by 100% mRCA stenosis which appeared chronic with R>R bridging collaterals s/p unsuccessful attempt at PCI of the mRCA CTO, as well as 99% mLAD stenosis managed with PCI/DES, and 40% ostial-proxLCx stenosis which was medically managed. She was started on aspirin and brilinta recommended for 12 months uninterrupted therapy. She had no further anginal symptoms.  - Continue aspirin and brilinta - Continue statin - Continue Bblocker - Rx for SL nitro given  2. ICM/acute combined CHF: Echo this admission revealed EF 44%. She maintained euvolemic status this admission. She was started on GDMT including Bblocker, ARNI, and SGLT2-inhibitor - Continue metoprolol tartrate - Continue entresto - Continue jardiance - Home prn lasix resumed at discharge  3. HTN: BP stable this admission - Managed in the setting of #2  4. HLD: LDL 112. She was transitioned from atorvastatin to crestor  daily.  - Continue crestor - Will need repeat FLP/LFTs in 6-8 weeks - if LDL not at goal o f<70, would refer to lipid clinic for PCSK9-inhibitor  5. OSA on CPAP: complaint - Continue CPAP  6. PACs: noted on EKG/tele this admission; asymptomatic - Continue Bblocker as above  Did the patient have an acute coronary syndrome (MI, NSTEMI, STEMI, etc) this  admission?:  Yes                               AHA/ACC Clinical Performance & Quality Measures: Aspirin prescribed? - Yes ADP Receptor Inhibitor (Plavix/Clopidogrel, Brilinta/Ticagrelor or Effient/Prasugrel) prescribed (includes medically managed patients)? - Yes Beta Blocker prescribed? - Yes High Intensity Statin (Lipitor 40-80mg  or Crestor 20-40mg ) prescribed? - Yes EF assessed during THIS hospitalization? - Yes For EF <40%, was ACEI/ARB prescribed? - Yes For EF <40%, Aldosterone Antagonist (Spironolactone or Eplerenone) prescribed? - Not Applicable (EF >/= 40%) Cardiac Rehab Phase II ordered (including medically managed patients)? - Yes      _____________  Discharge Vitals Blood pressure 103/68, pulse 82, temperature 98.5 F (36.9 C), temperature source Oral, resp. rate 16, height  (1.702 m), weight 122.4 kg, last menstrual period 07/11/2017, SpO2 98 %.  Filed Weights   07/29/21 1013 07/31/21 0358 08/01/21 0500  Weight: 122.9 kg 124.7 kg 122.4 kg    Labs & Radiologic Studies    CBC Recent Labs    07/31/21 0339 08/01/21 0149  WBC 14.0* 12.9*  HGB 11.7* 12.0  HCT 36.7  36.7  MCV 84.4 82.7  PLT 224 243   Basic Metabolic Panel Recent Labs    16/10/96 0724 08/01/21 0149  NA 136 135  K 3.5 3.7  CL 102 102  CO2 27 24  GLUCOSE 107* 108*  BUN 11 12  CREATININE 0.90 0.91  CALCIUM 8.6* 8.9   Liver Function Tests No results for input(s): AST, ALT, ALKPHOS, BILITOT, PROT, ALBUMIN in the last 72 hours. No results for input(s): LIPASE, AMYLASE in the last 72 hours. High Sensitivity Troponin:   Recent Labs  Lab 07/29/21 0130 07/29/21 0333  TROPONINIHS 17,854* 17,022*    BNP Invalid input(s): POCBNP D-Dimer No results for input(s): DDIMER in the last 72 hours. Hemoglobin A1C No results for input(s): HGBA1C in the last 72 hours. Fasting Lipid Panel Recent Labs    07/30/21 0629  CHOL 180  HDL 39*  LDLCALC 112*  TRIG 147  CHOLHDL 4.6   Thyroid Function  Tests No results for input(s): TSH, T4TOTAL, T3FREE, THYROIDAB in the last 72 hours.  Invalid input(s): FREET3 _____________  CARDIAC CATHETERIZATION  Result Date: 07/31/2021   Mid RCA lesion is 100% stenosed.   Ost Cx to Prox Cx lesion is 40% stenosed.   Mid LAD lesion is 99% stenosed.   Prox RCA lesion is 99% stenosed.   A drug-eluting stent was successfully placed using a SYNERGY XD 3.0X28.   Post intervention, there is a 0% residual stenosis. The LAD is a large caliber vessel that courses to the apex. The mid LAD is sub-totally occluded. The Circumflex is a large caliber vessel with moderate ostial stenosis The RCA is a large dominant artery with chronic occlusion of the mid vessel. The distal vessel fills from right to right bridging collaterals. Successful PTCA/DES x 1 mid LAD Unsuccessful attempt at PCI of the mid RCA CTO Elevated LVEDP Recommendations: Continue DAPT with ASA/Brilinta for one year. Continue beta blocker and statin. Will give one dose of IV Lasix today given her elevated filling pressures. She may benefit from ongoing diuresis.   DG Chest Portable 1 View  Result Date: 07/29/2021 CLINICAL DATA:  Chest pain, vomiting, and diarrhea.  Weakness. EXAM: PORTABLE CHEST 1 VIEW COMPARISON:  09/17/2018 FINDINGS: Mild cardiac enlargement. No vascular congestion, edema, or consolidation. There is evidence of bronchiectasis. No pleural effusions. No pneumothorax. Mediastinal contours appear intact. Old right rib fractures. IMPRESSION: Cardiac enlargement. No edema or consolidation. Bronchitic changes. Electronically Signed   By: Burman Nieves M.D.   On: 07/29/2021 00:48   ECHOCARDIOGRAM COMPLETE  Result Date: 07/30/2021    ECHOCARDIOGRAM REPORT   Patient Name:   Annette Bryant Date of Exam: 07/30/2021 Medical Rec #:  [id number]       Height:       67.0 in Accession #:    [id number]      Weight:       271.0 lb Date of Birth:  January 11, 1961       BSA:          2.301 m Patient Age:    59 years         BP:           111/67 mmHg Patient Gender: F               HR:           70 bpm. Exam Location:  Inpatient Procedure: 2D Echo, 3D Echo, Cardiac Doppler and Color Doppler Indications:    NSTEMI  History:  Patient has prior history of Echocardiogram examinations, most                 recent 12/11/2013. COPD; Risk Factors:Hypertension, Dyslipidemia                 and Sleep Apnea.  Sonographer:    Ross Ludwig RDCS (AE) Referring Phys: [phone] JILL D MCDANIEL IMPRESSIONS  1. Left ventricular ejection fraction by 3D volume is 44 %. The left ventricle has mildly decreased function. The left ventricle demonstrates regional wall motion abnormalities (see scoring diagram/findings for description). The left ventricular internal cavity size was mildly dilated. There is severe asymmetric left ventricular hypertrophy. Left ventricular diastolic parameters are consistent with Grade I diastolic dysfunction (impaired relaxation). There is severe akinesis of the left ventricular, basal-mid inferior wall.  2. Right ventricular systolic function is normal. The right ventricular size is normal.  3. The mitral valve is normal in structure. Mild mitral valve regurgitation.  4. The aortic valve is grossly normal. Aortic valve regurgitation is not visualized. No aortic stenosis is present. FINDINGS  Left Ventricle: Left ventricular ejection fraction by 3D volume is 44 %. The left ventricle has mildly decreased function. The left ventricle demonstrates regional wall motion abnormalities. Severe akinesis of the left ventricular, basal-mid inferior wall. The left ventricular internal cavity size was mildly dilated. There is severe asymmetric left ventricular hypertrophy. Left ventricular diastolic parameters are consistent with Grade I diastolic dysfunction (impaired relaxation). Right Ventricle: The right ventricular size is normal. Right vetricular wall thickness was not well visualized. Right ventricular systolic function is normal.  Left Atrium: Left atrial size was normal in size. Right Atrium: Right atrial size was normal in size. Pericardium: There is no evidence of pericardial effusion. Mitral Valve: The mitral valve is normal in structure. Mild mitral valve regurgitation. MV peak gradient, 3.7 mmHg. The mean mitral valve gradient is 1.0 mmHg. Tricuspid Valve: The tricuspid valve is normal in structure. Tricuspid valve regurgitation is not demonstrated. Aortic Valve: The aortic valve is grossly normal. Aortic valve regurgitation is not visualized. No aortic stenosis is present. Aortic valve mean gradient measures 5.0 mmHg. Aortic valve peak gradient measures 8.8 mmHg. Aortic valve area, by VTI measures 2.73 cm. Pulmonic Valve: The pulmonic valve was grossly normal. Pulmonic valve regurgitation is not visualized. Aorta: The aortic root and ascending aorta are structurally normal, with no evidence of dilitation. IAS/Shunts: The atrial septum is grossly normal.  LEFT VENTRICLE PLAX 2D LVIDd:         5.10 cm         Diastology LVIDs:         3.70 cm         LV e' medial:    6.31 cm/s LV PW:         1.00 cm         LV E/e' medial:  8.3 LV IVS:        1.90 cm         LV e' lateral:   9.25 cm/s LVOT diam:     2.20 cm         LV E/e' lateral: 5.7 LV SV:         78 LV SV Index:   34 LVOT Area:     3.80 cm        3D Volume EF  LV 3D EF:    Left                                             ventricul LV Volumes (MOD)                            ar LV vol d, MOD    142.0 ml                   ejection A2C:                                        fraction LV vol d, MOD    122.0 ml                   by 3D A4C:                                        volume is LV vol s, MOD    76.1 ml                    44 %. A2C: LV vol s, MOD    63.3 ml A4C:                           3D Volume EF: LV SV MOD A2C:   65.9 ml       3D EF:        44 % LV SV MOD A4C:   122.0 ml      LV EDV:       183 ml LV SV MOD BP:    64.7 ml       LV ESV:        103 ml                                LV SV:        80 ml RIGHT VENTRICLE             IVC RV Basal diam:  3.10 cm     IVC diam: 2.70 cm RV S prime:     13.20 cm/s TAPSE (M-mode): 2.6 cm LEFT ATRIUM           Index       RIGHT ATRIUM           Index LA diam:      2.60 cm 1.13 cm/m  RA Area:     16.20 cm LA Vol (A2C): 44.9 ml 19.51 ml/m RA Volume:   46.80 ml  20.34 ml/m LA Vol (A4C): 40.3 ml 17.51 ml/m  AORTIC VALVE AV Area (Vmax):    2.62 cm AV Area (Vmean):   2.63 cm AV Area (VTI):     2.73 cm AV Vmax:           148.00 cm/s AV Vmean:          99.100 cm/s AV VTI:            0.284 m AV Peak Grad:  8.8 mmHg AV Mean Grad:      5.0 mmHg LVOT Vmax:         102.00 cm/s LVOT Vmean:        68.600 cm/s LVOT VTI:          0.204 m LVOT/AV VTI ratio: 0.72  AORTA Ao Root diam: 3.20 cm Ao Asc diam:  3.30 cm MITRAL VALVE MV Area (PHT): 2.69 cm    SHUNTS MV Area VTI:   3.00 cm    Systemic VTI:  0.20 m MV Peak grad:  3.7 mmHg    Systemic Diam: 2.20 cm MV Mean grad:  1.0 mmHg MV Vmax:       0.96 m/s MV Vmean:      50.3 cm/s MV Decel Time: 282 msec MV E velocity: 52.50 cm/s MV A velocity: 70.60 cm/s MV E/A ratio:  0.74 Kristeen Miss MD Electronically signed by Kristeen Miss MD Signature Date/Time: 07/30/2021/12:11:31 PM    Final    Disposition   Pt is being discharged home today in good condition.  Follow-up Plans & Appointments     Follow-up Information     Laurann Montana, PA-C Follow up on 08/21/2021.   Specialties: Cardiology, Radiology Why: Please arrive 15 minutes early for your 3:30pm post-hospital follow-up. Contact information: [address] [place] [address] [phone]                Discharge Instructions     Amb Referral to Cardiac Rehabilitation   Complete by: As directed    Diagnosis:  Coronary Stents NSTEMI     After initial evaluation and assessments completed: Virtual Based Care may be provided alone or in conjunction with Phase 2 Cardiac Rehab based on patient barriers.:  Yes       Discharge Medications   Allergies as of 08/01/2021   No Known Allergies      Medication List     STOP taking these medications    atorvastatin 40 MG tablet Commonly known as: LIPITOR   naproxen 500 MG tablet Commonly known as: NAPROSYN       TAKE these medications    albuterol (2.5 MG/3ML) 0.083% nebulizer solution Commonly known as: PROVENTIL Take 2.5 mg by nebulization every 6 (six) hours as needed for wheezing or shortness of breath.   ProAir HFA 108 (90 Base) MCG/ACT inhaler Generic drug: albuterol Inhale 1-2 puffs into the lungs every 6 (six) hours as needed for wheezing or shortness of breath.   ALPRAZolam 1 MG tablet Commonly known as: XANAX Take 1 tablet (1 mg total) by mouth 2 (two) times daily.   aspirin 81 MG EC tablet Take 1 tablet (81 mg total) by mouth daily. Swallow whole. Start taking on: August 02, 2021   budesonide-formoterol 160-4.5 MCG/ACT inhaler Commonly known as: SYMBICORT Inhale 2 puffs into the lungs 2 (two) times daily.   cholecalciferol 25 MCG (1000 UNIT) tablet Commonly known as: VITAMIN D3 Take 1,000 Units by mouth daily.   empagliflozin 10 MG Tabs tablet Commonly known as: JARDIANCE Take 1 tablet (10 mg total) by mouth daily. Start taking on: August 02, 2021   furosemide 40 MG tablet Commonly known as: LASIX Take 40 mg by mouth daily as needed for fluid or edema.   HYDROcodone-acetaminophen 7.5-325 MG tablet Commonly known as: NORCO Take 0.5-1 tablets by mouth 3 (three) times daily as needed for severe pain.   Klor-Con M20 20 MEQ tablet Generic drug: potassium chloride SA Take 20 mEq by mouth daily  as needed (when taking furosemide).   metoprolol tartrate 25 MG tablet Commonly known as: LOPRESSOR Take 1 tablet (25 mg total) by mouth 2 (two) times daily.   multivitamin with minerals Tabs tablet Take 1 tablet by mouth daily.   nitroGLYCERIN 0.4 MG SL tablet Commonly known as: NITROSTAT Place 1 tablet  (0.4 mg total) under the tongue every 5 (five) minutes x 3 doses as needed for chest pain.   rosuvastatin 40 MG tablet Commonly known as: CRESTOR Take 1 tablet (40 mg total) by mouth daily. Start taking on: August 02, 2021   sacubitril-valsartan 24-26 MG Commonly known as: ENTRESTO Take 1 tablet by mouth 2 (two) times daily.   Systane 0.4-0.3 % Soln Generic drug: Polyethyl Glycol-Propyl Glycol Apply 1 drop to eye daily as needed (dry eyes).   ticagrelor 90 MG Tabs tablet Commonly known as: BRILINTA Take 1 tablet (90 mg total) by mouth 2 (two) times daily.           Outstanding Labs/Studies   Repeat FLP in 6-8 weeks  Anticipate repeat echocardiogram in 3 months to evaluate for improvement in EF  Duration of Discharge Encounter   Greater than 30 minutes including physician time.  Signed, Beatriz StallionKrista M. Gwenneth Whiteman, PA-C 08/01/2021, 2:40 PM

## 2021-08-01 NOTE — Progress Notes (Signed)
Progress Note  Patient Name: Annette Bryant Date of Encounter: 08/01/2021  [facility] Cardiologist: None   Subjective   Patient doing okay this morning.  No chest pain or shortness of breath.  Inpatient Medications    Scheduled Meds:  ALPRAZolam  1 mg Oral BID   ALPRAZolam  1 mg Oral Once   aspirin EC  81 mg Oral Daily   metoprolol tartrate  25 mg Oral BID   potassium chloride  40 mEq Oral Once   rosuvastatin  20 mg Oral Daily   sodium chloride flush  3 mL Intravenous Q12H   sodium chloride flush  3 mL Intravenous Q12H   ticagrelor  90 mg Oral BID   Continuous Infusions:  sodium chloride     PRN Meds: sodium chloride, acetaminophen, albuterol, nitroGLYCERIN, ondansetron (ZOFRAN) IV, sodium chloride flush   Vital Signs    Vitals:   07/31/21 2132 07/31/21 2356 08/01/21 0452 08/01/21 0500  BP: 122/82 133/81 140/69   Pulse: 85 82 77   Resp: 20  16   Temp: 98.7 F (37.1 C) 98.6 F (37 C) 98.6 F (37 C)   TempSrc: Oral Oral Oral   SpO2: 100% 99% 95%   Weight:    122.4 kg  Height:        Intake/Output Summary (Last 24 hours) at 08/01/2021 0921 Last data filed at 08/01/2021 0842 Gross per 24 hour  Intake 840 ml  Output 2850 ml  Net -2010 ml   Last 3 Weights 08/01/2021 07/31/2021 07/29/2021  Weight (lbs) 269 lb 12.8 oz 275 lb 271 lb  Weight (kg) 122.38 kg 124.739 kg 122.925 kg  Some encounter information is confidential and restricted. Go to Review Flowsheets activity to see all data.      Telemetry    Sinus rhythm with episodes of sinus bradycardia- Personally Reviewed  ECG    Normal sinus rhythm 84 bpm, ST and T wave abnormality consider anterolateral ischemia, inferior ischemia- Personally Reviewed  Physical Exam  Alert, oriented, no distress GEN: No acute distress.   Neck: No JVD Cardiac: RRR, no murmurs, rubs, or gallops.  Respiratory: Clear to auscultation bilaterally. GI: Soft, nontender, non-distended  MS: No edema; No deformity.  Right  radial site clear with no hematoma or ecchymosis Neuro:  Nonfocal  Psych: Normal affect   Labs    High Sensitivity Troponin:   Recent Labs  Lab 07/29/21 0130 07/29/21 0333  TROPONINIHS 17,854* 17,022*      Chemistry Recent Labs  Lab 07/28/21 2320 07/30/21 0629 07/31/21 0724 08/01/21 0149  NA 135 136 136 135  K 3.4* 3.7 3.5 3.7  CL 100 100 102 102  CO2 27 28 27 24   GLUCOSE 139* 135* 107* 108*  BUN 11 11 11 12   CREATININE 0.91 1.18* 0.90 0.91  CALCIUM 9.0 8.9 8.6* 8.9  PROT 8.9*  --   --   --   ALBUMIN 4.3  --   --   --   AST 114*  --   --   --   ALT 26  --   --   --   ALKPHOS 138*  --   --   --   BILITOT 1.1  --   --   --   GFRNONAA >60 54* >60 >60  ANIONGAP 8 8 7 9      Hematology Recent Labs  Lab 07/30/21 0629 07/31/21 0339 08/01/21 0149  WBC 13.1* 14.0* 12.9*  RBC 4.67 4.35 4.44  HGB 12.6 11.7*  12.0  HCT 39.4 36.7 36.7  MCV 84.4 84.4 82.7  MCH 27.0 26.9 27.0  MCHC 32.0 31.9 32.7  RDW 15.9* 15.6* 15.4  PLT 231 224 243    BNPNo results for input(s): BNP, PROBNP in the last 168 hours.   DDimer No results for input(s): DDIMER in the last 168 hours.   Radiology    CARDIAC CATHETERIZATION  Result Date: 07/31/2021   Mid RCA lesion is 100% stenosed.   Ost Cx to Prox Cx lesion is 40% stenosed.   Mid LAD lesion is 99% stenosed.   Prox RCA lesion is 99% stenosed.   A drug-eluting stent was successfully placed using a SYNERGY XD 3.0X28.   Post intervention, there is a 0% residual stenosis. The LAD is a large caliber vessel that courses to the apex. The mid LAD is sub-totally occluded. The Circumflex is a large caliber vessel with moderate ostial stenosis The RCA is a large dominant artery with chronic occlusion of the mid vessel. The distal vessel fills from right to right bridging collaterals. Successful PTCA/DES x 1 mid LAD Unsuccessful attempt at PCI of the mid RCA CTO Elevated LVEDP Recommendations: Continue DAPT with ASA/Brilinta for one year. Continue beta  blocker and statin. Will give one dose of IV Lasix today given her elevated filling pressures. She may benefit from ongoing diuresis.   ECHOCARDIOGRAM COMPLETE  Result Date: 07/30/2021    ECHOCARDIOGRAM REPORT   Patient Name:   Annette Bryant Date of Exam: 07/30/2021 Medical Rec #:  [id number]       Height:       67.0 in Accession #:    [id number]      Weight:       271.0 lb Date of Birth:  May 13, 1961       BSA:          2.301 m Patient Age:    59 years        BP:           111/67 mmHg Patient Gender: F               HR:           70 bpm. Exam Location:  Inpatient Procedure: 2D Echo, 3D Echo, Cardiac Doppler and Color Doppler Indications:    NSTEMI  History:        Patient has prior history of Echocardiogram examinations, most                 recent 12/11/2013. COPD; Risk Factors:Hypertension, Dyslipidemia                 and Sleep Apnea.  Sonographer:    Ross LudwigArthur Guy RDCS (AE) Referring Phys: [phone] JILL D MCDANIEL IMPRESSIONS  1. Left ventricular ejection fraction by 3D volume is 44 %. The left ventricle has mildly decreased function. The left ventricle demonstrates regional wall motion abnormalities (see scoring diagram/findings for description). The left ventricular internal cavity size was mildly dilated. There is severe asymmetric left ventricular hypertrophy. Left ventricular diastolic parameters are consistent with Grade I diastolic dysfunction (impaired relaxation). There is severe akinesis of the left ventricular, basal-mid inferior wall.  2. Right ventricular systolic function is normal. The right ventricular size is normal.  3. The mitral valve is normal in structure. Mild mitral valve regurgitation.  4. The aortic valve is grossly normal. Aortic valve regurgitation is not visualized. No aortic stenosis is present. FINDINGS  Left Ventricle: Left ventricular ejection fraction by 3D volume is  44 %. The left ventricle has mildly decreased function. The left ventricle demonstrates regional wall motion  abnormalities. Severe akinesis of the left ventricular, basal-mid inferior wall. The left ventricular internal cavity size was mildly dilated. There is severe asymmetric left ventricular hypertrophy. Left ventricular diastolic parameters are consistent with Grade I diastolic dysfunction (impaired relaxation). Right Ventricle: The right ventricular size is normal. Right vetricular wall thickness was not well visualized. Right ventricular systolic function is normal. Left Atrium: Left atrial size was normal in size. Right Atrium: Right atrial size was normal in size. Pericardium: There is no evidence of pericardial effusion. Mitral Valve: The mitral valve is normal in structure. Mild mitral valve regurgitation. MV peak gradient, 3.7 mmHg. The mean mitral valve gradient is 1.0 mmHg. Tricuspid Valve: The tricuspid valve is normal in structure. Tricuspid valve regurgitation is not demonstrated. Aortic Valve: The aortic valve is grossly normal. Aortic valve regurgitation is not visualized. No aortic stenosis is present. Aortic valve mean gradient measures 5.0 mmHg. Aortic valve peak gradient measures 8.8 mmHg. Aortic valve area, by VTI measures 2.73 cm. Pulmonic Valve: The pulmonic valve was grossly normal. Pulmonic valve regurgitation is not visualized. Aorta: The aortic root and ascending aorta are structurally normal, with no evidence of dilitation. IAS/Shunts: The atrial septum is grossly normal.  LEFT VENTRICLE PLAX 2D LVIDd:         5.10 cm         Diastology LVIDs:         3.70 cm         LV e' medial:    6.31 cm/s LV PW:         1.00 cm         LV E/e' medial:  8.3 LV IVS:        1.90 cm         LV e' lateral:   9.25 cm/s LVOT diam:     2.20 cm         LV E/e' lateral: 5.7 LV SV:         78 LV SV Index:   34 LVOT Area:     3.80 cm        3D Volume EF                                LV 3D EF:    Left                                             ventricul LV Volumes (MOD)                            ar LV vol d, MOD     142.0 ml                   ejection A2C:                                        fraction LV vol d, MOD    122.0 ml                   by 3D A4C:  volume is LV vol s, MOD    76.1 ml                    44 %. A2C: LV vol s, MOD    63.3 ml A4C:                           3D Volume EF: LV SV MOD A2C:   65.9 ml       3D EF:        44 % LV SV MOD A4C:   122.0 ml      LV EDV:       183 ml LV SV MOD BP:    64.7 ml       LV ESV:       103 ml                                LV SV:        80 ml RIGHT VENTRICLE             IVC RV Basal diam:  3.10 cm     IVC diam: 2.70 cm RV S prime:     13.20 cm/s TAPSE (M-mode): 2.6 cm LEFT ATRIUM           Index       RIGHT ATRIUM           Index LA diam:      2.60 cm 1.13 cm/m  RA Area:     16.20 cm LA Vol (A2C): 44.9 ml 19.51 ml/m RA Volume:   46.80 ml  20.34 ml/m LA Vol (A4C): 40.3 ml 17.51 ml/m  AORTIC VALVE AV Area (Vmax):    2.62 cm AV Area (Vmean):   2.63 cm AV Area (VTI):     2.73 cm AV Vmax:           148.00 cm/s AV Vmean:          99.100 cm/s AV VTI:            0.284 m AV Peak Grad:      8.8 mmHg AV Mean Grad:      5.0 mmHg LVOT Vmax:         102.00 cm/s LVOT Vmean:        68.600 cm/s LVOT VTI:          0.204 m LVOT/AV VTI ratio: 0.72  AORTA Ao Root diam: 3.20 cm Ao Asc diam:  3.30 cm MITRAL VALVE MV Area (PHT): 2.69 cm    SHUNTS MV Area VTI:   3.00 cm    Systemic VTI:  0.20 m MV Peak grad:  3.7 mmHg    Systemic Diam: 2.20 cm MV Mean grad:  1.0 mmHg MV Vmax:       0.96 m/s MV Vmean:      50.3 cm/s MV Decel Time: 282 msec MV E velocity: 52.50 cm/s MV A velocity: 70.60 cm/s MV E/A ratio:  0.74 Kristeen Miss MD Electronically signed by Kristeen Miss MD Signature Date/Time: 07/30/2021/12:11:31 PM    Final     Cardiac Studies   See above  Patient Profile     60 y.o. female with past medical history of HTN, HLD, morbid obesity, OSA on CPAP, COPD, anxiety with depression, who presented to [facility] ER on 07/29/21 for emesis, diarrhea after  eating some cookies. She had  2 days of chest pain radiating to right elbow prior to that. Hs trop 17854 > 17022. EKG showed ST with HR 101bpm, frequent PACs, anterior Q waves (present on prior tracing) and ST elevation of lateral leads and  ST depression of inferior leads. She was started on heparin gtt, transferred to [facility] for cardiac cath on Monday.  Assessment & Plan    1.  Non-STEMI/late presenting MI: Patient found to have severe two-vessel coronary artery disease at cardiac catheterization and treated with stenting of the proximal LAD with good result.  The LAD lesion was critical and likely the patient's culprit vessel.  The RCA was totally occluded with an attempt at wiring the vessel unsuccessful.  I suspect this represents a chronic total occlusion.  Recommend initial medical therapy.  Patient is medically stable now day 1 post PCI with no recurrent angina, stable heart rhythm, and no vascular access site complication.  She is treated with dual antiplatelet therapy using aspirin and ticagrelor.  The patient is on a high intensity statin drug with rosuvastatin 20 mg.  Her LVEF is 44%.  She is on metoprolol.  Will add Entresto at low-dose.  We will add Jardiance 10 mg daily.  Follow-up will be arranged in [place].  Discussed the importance of phase 2 cardiac rehab with her.  I think she would benefit from extensive dietary education. 2.  Hypertension: Medication adjustments as above. 3.  Mixed hyperlipidemia: On rosuvastatin 20, favor increase to 40 mg if no intolerance or contraindication noted. 4.  Obesity: Lifestyle modification discussed 5.  Disposition: Medically stable for discharge home today.  Recommend metabolic panel 1 to 2 weeks and office follow-up 1 to 2 weeks in [place].  For questions or updates, please contact [facility] Please consult [email or web address] for contact info under        Signed, Tonny Bollman, MD  08/01/2021, 9:21 AM

## 2021-08-01 NOTE — Progress Notes (Signed)
0830 Came to see pt to walk and educate. Family in room. Pt stated did not sleep last night. Asked that I come back in two hours. Will return then. Luetta Nutting RN BSN 08/01/2021 8:32 AM

## 2021-08-01 NOTE — Progress Notes (Signed)
Pt sinus arrhythmia with occasional pauses with non-conducted beats while sleeping- see saved tele strips

## 2021-08-11 ENCOUNTER — Telehealth: Payer: Self-pay | Admitting: *Deleted

## 2021-08-11 NOTE — Telephone Encounter (Signed)
Spoke with patient about participating in Clinical Research Study [facility].  Patient was emailed a copy of consent.  Will Follow-up with the patient in a few days.     Evern Bio, RN BSN [facility] Cardiovascular Research & Education Direct Line: [phone]

## 2021-08-16 ENCOUNTER — Other Ambulatory Visit (HOSPITAL_COMMUNITY): Payer: Self-pay | Admitting: Family Medicine

## 2021-08-16 DIAGNOSIS — Z1231 Encounter for screening mammogram for malignant neoplasm of breast: Secondary | ICD-10-CM

## 2021-08-18 ENCOUNTER — Encounter: Payer: Self-pay | Admitting: Physician Assistant

## 2021-08-18 NOTE — Progress Notes (Addendum)
Cardiology Office Note    Date:  08/21/2021   ID:  [name], DOB [birth year], MRN [id number]  PCP:  Lemmie Evens, MD  Cardiologist:  Remotely Dr. Domenic Polite 2015, seen by Dr. Acie Fredrickson first in recent [place] 07/2021 Electrophysiologist:  None   Chief Complaint: f/u MI  History of Present Illness:   Annette Bryant is a 60 y.o. female with history of HTN, OSA on CPAP, morbid obesity, anxiety, depression, recently diagnosed CAD with late presenting MI, ischemic cardiomyopathy/chronic combined CHF, pre-DM by recent A1C who presents for post-hospital follow-up.  She was recently admitted to the hospital 07/2021 with 2 day history of chest pain with nausea, diarrhea, and elbow pain. She was found to have markedly elevated troponin in the 17k range.  Cardiac cath showed findings below with PTCA/DES to mid LAD and unsuccessful PCI of the mid RCA CTO, with residual disease treated medically. Her LVEDP was elevated prompting dose of IV Lasix. 2D echo showed EF 44%, mildly dilated LV, grade 1 DD, severe akinesis of the left ventricular, basal-mid inferior wall, mild MR. She was started on standard post-MI therapy and GDMT for HF. She had some anxiety during her stay. She was also noted to have PACs on tele.  She presents for follow-up today. She states overall she's been doing OK, trying to go out and about walking in stores. She did notice she felt a little weak at [facility] on Saturday but no localizing symptoms. Today in the waiting room she had some chest pain so took a SL NTG. It felt similar to prior angina but did not radiate like it had before. This is the only episode of CP she's had since DC. She states she thinks it was because she was just nervous. She was brought back for her OV and reported feeling a little weak. Her CP had resolved in about 10 minutes time. HR was 104 (sinus tach) and BP was 98/70. We saw her quickly and she reported feeling significantly better within minutes. No further chest  pain. HR came down to the 80s. BP remains 92/60 but she is completely asymptomatic now. No orthopnea, PND, palpitations, syncope or presyncope. She does not take Lasix/KCl every day - only about 3x/week.   Labwork independently reviewed: 07/2021 K 3.7, Cr 0.91, WBC 12.9, Hgb 12.0, plt 243, LDL 112, trig 147, HDL 39, A1C 6.4, AST 114, ALT 26, alk phos 138 2015 TSH wnl  Past Medical History:  Diagnosis Date   Anxiety    Arthritis    Asthma    CAD (coronary artery disease)    Carpal tunnel syndrome    Chronic combined systolic (congestive) and diastolic (congestive) heart failure (HCC)    COPD (chronic obstructive pulmonary disease) (HCC)    Depression    Dyspnea    a. Adm 12/2013 for dyspnea/palps: normal stress-pics-only Lexiscan nuc, echo unremarkable with normal EF, d-dimer normal, ruled out for MI.   Elevated lipids    HLD (hyperlipidemia)    Hyperglycemia    Hypertension    Ischemic cardiomyopathy    Mild mitral regurgitation    Morbid obesity (HCC)    Panic attacks    Pre-diabetes    Premature atrial contractions    Sleep apnea    uses CPAP    Past Surgical History:  Procedure Laterality Date   CESAREAN SECTION     CHOLECYSTECTOMY N/A 08/01/2017   Procedure: LAPAROSCOPIC CHOLECYSTECTOMY;  Surgeon: Donnie Mesa, MD;  Location: [facility];  Service: General;  Laterality: N/A;   COLONOSCOPY WITH PROPOFOL N/A 11/21/2020   Procedure: COLONOSCOPY WITH PROPOFOL;  Surgeon: Daneil Dolin, MD;  Location: [facility];  Service: Endoscopy;  Laterality: N/A;  12:00pm   CORONARY STENT INTERVENTION N/A 07/31/2021   Procedure: CORONARY STENT INTERVENTION;  Surgeon: Burnell Blanks, MD;  Location: [place] [facility];  Service: Cardiovascular;  Laterality: N/A;   LEFT HEART CATH AND CORONARY ANGIOGRAPHY N/A 07/31/2021   Procedure: LEFT HEART CATH AND CORONARY ANGIOGRAPHY;  Surgeon: Burnell Blanks, MD;  Location: [place];  Service: Cardiovascular;  Laterality:  N/A;   TUBAL LIGATION      Current Medications: Current Meds  Medication Sig   albuterol (PROVENTIL) (2.5 MG/3ML) 0.083% nebulizer solution Take 2.5 mg by nebulization every 6 (six) hours as needed for wheezing or shortness of breath.    ALPRAZolam (XANAX) 1 MG tablet Take 1 tablet (1 mg total) by mouth 2 (two) times daily.   aspirin 81 MG EC tablet Take 1 tablet (81 mg total) by mouth daily. Swallow whole.   budesonide-formoterol (SYMBICORT) 160-4.5 MCG/ACT inhaler Inhale 2 puffs into the lungs 2 (two) times daily.   cholecalciferol (VITAMIN D3) 25 MCG (1000 UNIT) tablet Take 1,000 Units by mouth daily.   empagliflozin (JARDIANCE) 10 MG TABS tablet Take 1 tablet (10 mg total) by mouth daily.   furosemide (LASIX) 40 MG tablet Take 40 mg by mouth daily as needed for fluid or edema.    HYDROcodone-acetaminophen (NORCO) 7.5-325 MG tablet Take 0.5-1 tablets by mouth 3 (three) times daily as needed for severe pain.    KLOR-CON M20 20 MEQ tablet Take 20 mEq by mouth daily as needed (when taking furosemide).   metoprolol tartrate (LOPRESSOR) 25 MG tablet Take 1 tablet (25 mg total) by mouth 2 (two) times daily.   Multiple Vitamin (MULTIVITAMIN WITH MINERALS) TABS tablet Take 1 tablet by mouth daily.   nitroGLYCERIN (NITROSTAT) 0.4 MG SL tablet Place 1 tablet (0.4 mg total) under the tongue every 5 (five) minutes x 3 doses as needed for chest pain.   Polyethyl Glycol-Propyl Glycol (SYSTANE) 0.4-0.3 % SOLN Apply 1 drop to eye daily as needed (dry eyes).   PROAIR HFA 108 (90 BASE) MCG/ACT inhaler Inhale 1-2 puffs into the lungs every 6 (six) hours as needed for wheezing or shortness of breath.   rosuvastatin (CRESTOR) 40 MG tablet Take 1 tablet (40 mg total) by mouth daily.   sacubitril-valsartan (ENTRESTO) 24-26 MG Take 1 tablet by mouth 2 (two) times daily.   ticagrelor (BRILINTA) 90 MG TABS tablet Take 1 tablet (90 mg total) by mouth 2 (two) times daily.      Allergies:   Patient has no known  allergies.   Social History   Socioeconomic History   Marital status: Legally Separated    Spouse name: Not on file   Number of children: Not on file   Years of education: Not on file   Highest education level: Not on file  Occupational History   Not on file  Tobacco Use   Smoking status: Never   Smokeless tobacco: Never  Vaping Use   Vaping Use: Never used  Substance and Sexual Activity   Alcohol use: No   Drug use: No   Sexual activity: Not Currently    Birth control/protection: Surgical  Other Topics Concern   Not on file  Social History Narrative   Not on file   Social Determinants of Health   Financial Resource Strain:  Not on file  Food Insecurity: Not on file  Transportation Needs: Not on file  Physical Activity: Not on file  Stress: Not on file  Social Connections: Not on file     Family History:  The patient's family history includes Alcohol abuse in her brother and father; Coronary artery disease in her sister; Coronary artery disease (age of onset: 40) in her sister; Depression in her mother and sister; Heart attack (age of onset: 80) in her sister; Schizophrenia in her brother. There is no history of Colon cancer.  ROS:   Please see the history of present illness.  All other systems are reviewed and otherwise negative.    EKGs/Labs/Other Studies Reviewed:    Studies reviewed are outlined and summarized above. Reports included below if pertinent.  Cardiac Cath 07/2021   Mid RCA lesion is 100% stenosed.   Ost Cx to Prox Cx lesion is 40% stenosed.   Mid LAD lesion is 99% stenosed.   Prox RCA lesion is 99% stenosed.   A drug-eluting stent was successfully placed using a SYNERGY XD 3.0X28.   Post intervention, there is a 0% residual stenosis.   The LAD is a large caliber vessel that courses to the apex. The mid LAD is sub-totally occluded.  The Circumflex is a large caliber vessel with moderate ostial stenosis The RCA is a large dominant artery with  chronic occlusion of the mid vessel. The distal vessel fills from right to right bridging collaterals.  Successful PTCA/DES x 1 mid LAD Unsuccessful attempt at PCI of the mid RCA CTO Elevated LVEDP   Recommendations: Continue DAPT with ASA/Brilinta for one year. Continue beta blocker and statin. Will give one dose of IV Lasix today given her elevated filling pressures. She may benefit from ongoing diuresis.   2D echo 07/30/21    1. Left ventricular ejection fraction by 3D volume is 44 %. The left  ventricle has mildly decreased function. The left ventricle demonstrates  regional wall motion abnormalities (see scoring diagram/findings for  description). The left ventricular  internal cavity size was mildly dilated. There is severe asymmetric left  ventricular hypertrophy. Left ventricular diastolic parameters are  consistent with Grade I diastolic dysfunction (impaired relaxation). There  is severe akinesis of the left  ventricular, basal-mid inferior wall.   2. Right ventricular systolic function is normal. The right ventricular  size is normal.   3. The mitral valve is normal in structure. Mild mitral valve  regurgitation.   4. The aortic valve is grossly normal. Aortic valve regurgitation is not  visualized. No aortic stenosis is present.     EKG:  EKG is ordered today, personally reviewed, demonstrating sinus tach 104bpm, nonspecific STTW changes, prior septal infarct  Recent Labs: 07/28/2021: ALT 26 08/01/2021: BUN 12; Creatinine, Ser 0.91; Hemoglobin 12.0; Platelets 243; Potassium 3.7; Sodium 135  Recent Lipid Panel    Component Value Date/Time   CHOL 180 07/30/2021 0629   TRIG 147 07/30/2021 0629   HDL 39 (L) 07/30/2021 0629   CHOLHDL 4.6 07/30/2021 0629   VLDL 29 07/30/2021 0629   LDLCALC 112 (H) 07/30/2021 0629    PHYSICAL EXAM:    VS:  BP 98/70   Pulse (!) 104   Ht '5\' 7"'  (1.702 m)   Wt 164 lb 12.8 oz (74.8 kg)   LMP 07/11/2017 (Exact Date)   SpO2 96%   BMI  25.81 kg/m   BMI: Body mass index is 25.81 kg/m.  GEN: Well nourished, well developed female in  no acute distress HEENT: normocephalic, atraumatic Neck: no JVD, carotid bruits, or masses Cardiac: RRR; no murmurs, rubs, or gallops, no edema  Respiratory:  clear to auscultation bilaterally, normal work of breathing GI: soft, nontender, nondistended, + BS MS: no deformity or atrophy Skin: warm and dry, no rash, right radial cath site without hematoma or ecchymosis; good pulse. Neuro:  Alert and Oriented x 3, Strength and sensation are intact, follows commands Psych: euthymic mood, full affect  Wt Readings from Last 3 Encounters:  08/21/21 264 lb 12.8 oz (120.1 kg)  08/01/21 269 lb 12.8 oz (122.4 kg)  06/21/21 260 lb (117.9 kg)     ASSESSMENT & PLAN:   1. Hypotension - overall had been doing well but today had episode of CP in waiting room today then took SL NTG followed by sinus tachy and hypotension. Symptoms resolved within minutes and she felt fine the remainder of the visit. HR returned to the 80s. Her BP remained soft. EKG nonacute. I reviewed patient and EKG with Dr. Domenic Polite. Since patient is now asymptomatic, he feels we can manage her as outpatient by pulling back on some of her medications - had multiple meds started in the hospital recently. Will hold Jardiance and Entresto and have her do a nurse visit later this week, with close post-hospital follow-up thereafter (next available). Will also check stat CBC/BMET if patient agreeable - she was very upset about having to get labs but discussed rationale. She is not SOB or hypoxic and is asymptomatic leaving the office. ER precautions reviewed.  2. CAD s/p recent MI - brief recurrent CP, took SL NTG as above. EKG nonacute, reviewed with MD. Will continue ASA, Brilinta, BB as tolerated, and statin. Will need to review plan for f/u liver/lipids at next OV. Defer cardiac rehab until BP stabilizes.  3. Chronic combined CHF/ICM - appears  euvolemic. Reviewed 2g sodium restriction, 2L fluid restriction, daily weights with patient. Hold [place] and [place] for now as above. She only takes Lasix/KCl sparingly at this time. Check BMET today along with baseline TSH. Consider f/u echo 3 months post-MI.  4. Hyperlipidemia - will need consideration of f/u liver/LFTs arranged at follow-up if tolerating statin.  5. Mild MR - per guidelines would recheck echo within 3-5 years but anticipate she may have follow-up echocardiogram sooner than this to reassess whether there was recovery of LV function post MI.    Cardiac Rehabilitation Eligibility Assessment: The patient is NOT ready to start Cardiac Rehab due to hypotension.   Disposition: Will have her do a nurse visit later this week, with close post-hospital follow-up thereafter (next available).   Medication Adjustments/Labs and Tests Ordered: Current medicines are reviewed at length with the patient today.  Concerns regarding medicines are outlined above. Medication changes, Labs and Tests ordered today are summarized above and listed in the Patient Instructions accessible in Encounters.    Signed, Charlie Pitter, PA-C  08/21/2021 3:51 PM    [facility] in [place]. [place], [place] [address] Ph: [phone]; Fax [phone]

## 2021-08-21 ENCOUNTER — Other Ambulatory Visit: Payer: Self-pay

## 2021-08-21 ENCOUNTER — Encounter: Payer: Self-pay | Admitting: Physician Assistant

## 2021-08-21 ENCOUNTER — Ambulatory Visit (INDEPENDENT_AMBULATORY_CARE_PROVIDER_SITE_OTHER): Payer: Medicare Other | Admitting: Physician Assistant

## 2021-08-21 ENCOUNTER — Other Ambulatory Visit (HOSPITAL_COMMUNITY)
Admission: AD | Admit: 2021-08-21 | Discharge: 2021-08-21 | Disposition: A | Discharge status: Home / Self Care | Payer: Medicare Other | Source: Skilled Nursing Facility | Attending: Physician Assistant | Admitting: Physician Assistant

## 2021-08-21 VITALS — BP 98/70 | HR 104 | Ht 67.0 in | Wt 264.8 lb

## 2021-08-21 DIAGNOSIS — I959 Hypotension, unspecified: Secondary | ICD-10-CM

## 2021-08-21 DIAGNOSIS — I255 Ischemic cardiomyopathy: Secondary | ICD-10-CM | POA: Insufficient documentation

## 2021-08-21 DIAGNOSIS — I110 Hypertensive heart disease with heart failure: Secondary | ICD-10-CM | POA: Insufficient documentation

## 2021-08-21 DIAGNOSIS — I10 Essential (primary) hypertension: Secondary | ICD-10-CM | POA: Diagnosis not present

## 2021-08-21 DIAGNOSIS — I340 Nonrheumatic mitral (valve) insufficiency: Secondary | ICD-10-CM | POA: Diagnosis not present

## 2021-08-21 DIAGNOSIS — I2510 Atherosclerotic heart disease of native coronary artery without angina pectoris: Secondary | ICD-10-CM | POA: Diagnosis not present

## 2021-08-21 DIAGNOSIS — E785 Hyperlipidemia, unspecified: Secondary | ICD-10-CM | POA: Diagnosis not present

## 2021-08-21 DIAGNOSIS — I5042 Chronic combined systolic (congestive) and diastolic (congestive) heart failure: Secondary | ICD-10-CM

## 2021-08-21 LAB — BASIC METABOLIC PANEL
Anion gap: 6 (ref 5–15)
BUN: 18 mg/dL (ref 6–20)
CO2: 25 mmol/L (ref 22–32)
Calcium: 8.9 mg/dL (ref 8.9–10.3)
Chloride: 106 mmol/L (ref 98–111)
Creatinine, Ser: 1.08 mg/dL — ABNORMAL HIGH (ref 0.44–1.00)
GFR, Estimated: 60 mL/min — ABNORMAL LOW (ref >60)
Glucose, Bld: 113 mg/dL — ABNORMAL HIGH (ref 70–99)
Potassium: 3.8 mmol/L (ref 3.5–5.1)
Sodium: 137 mmol/L (ref 135–145)

## 2021-08-21 LAB — CBC
HCT: 44.1 % (ref 36.0–46.0)
Hemoglobin: 13.6 g/dL (ref 12.0–15.0)
MCH: 26.4 pg (ref 26.0–34.0)
MCHC: 30.8 g/dL (ref 30.0–36.0)
MCV: 85.6 fL (ref 80.0–100.0)
Platelets: 306 10*3/uL (ref 150–400)
RBC: 5.15 MIL/uL — ABNORMAL HIGH (ref 3.87–5.11)
RDW: 15.1 % (ref 11.5–15.5)
WBC: 8.1 10*3/uL (ref 4.0–10.5)
nRBC: 0 % (ref 0.0–0.2)

## 2021-08-21 LAB — TSH: TSH: 0.408 u[IU]/mL (ref 0.350–4.500)

## 2021-08-21 NOTE — Patient Instructions (Signed)
Medication Instructions:   Stop Taking Entresto for now  Stop Taking Jardiance for now   *If you need a refill on your cardiac medications before your next appointment, please call your pharmacy*   Lab Work: Your physician recommends that you return for lab work in: Today (STAT)   If you have labs (blood work) drawn today and your tests are completely normal, you will receive your results only by: MyChart Message (if you have MyChart) OR A paper copy in the mail If you have any lab test that is abnormal or we need to change your treatment, we will call you to review the results.   Testing/Procedures: NONE    Follow-Up: At [facility], you and your health needs are our priority.  As part of our continuing mission to provide you with exceptional heart care, we have created designated Provider Care Teams.  These Care Teams include your primary Cardiologist (physician) and Advanced Practice Providers (APPs -  Physician Assistants and Nurse Practitioners) who all work together to provide you with the care you need, when you need it.  We recommend signing up for the patient portal called "MyChart".  Sign up information is provided on this After Visit Summary.  MyChart is used to connect with patients for Virtual Visits (Telemedicine).  Patients are able to view lab/test results, encounter notes, upcoming appointments, etc.  Non-urgent messages can be sent to your provider as well.   To learn more about what you can do with MyChart, go to [email or web address].    Your next appointment:   1-2  week(s)  The format for your next appointment:   In Person  Provider:   You will see one of the following Advanced Practice Providers on your designated Care Team:   Randall An, PA-C  Jacolyn Reedy, PA-C   Other Instructions Thank you for choosing [facility]!

## 2021-08-22 ENCOUNTER — Telehealth: Payer: Self-pay | Admitting: Physician Assistant

## 2021-08-22 NOTE — Telephone Encounter (Signed)
Lab results given to patient.

## 2021-08-22 NOTE — Telephone Encounter (Signed)
New message    Patient is returning call to Kisha   

## 2021-08-23 ENCOUNTER — Other Ambulatory Visit: Payer: Self-pay

## 2021-08-23 ENCOUNTER — Ambulatory Visit (INDEPENDENT_AMBULATORY_CARE_PROVIDER_SITE_OTHER): Payer: Medicare Other | Admitting: *Deleted

## 2021-08-23 VITALS — BP 138/88 | HR 74 | Ht 67.0 in | Wt 261.8 lb

## 2021-08-23 DIAGNOSIS — Z0130 Encounter for examination of blood pressure without abnormal findings: Secondary | ICD-10-CM

## 2021-08-23 NOTE — Progress Notes (Signed)
Pt states that she is feeling much better. " I'm back to normal".

## 2021-08-28 ENCOUNTER — Telehealth: Payer: Self-pay | Admitting: Physician Assistant

## 2021-08-28 NOTE — Telephone Encounter (Signed)
New message    Patient called said someone called her about getting an order for cardiac rehab?  I did not see a note?

## 2021-08-30 ENCOUNTER — Telehealth: Payer: Self-pay | Admitting: Physician Assistant

## 2021-08-30 MED ORDER — METOPROLOL TARTRATE 25 MG PO TABS: 25.0000 mg | ORAL_TABLET | Freq: Two times a day (BID) | ORAL | 3 refills | Status: DC

## 2021-08-30 NOTE — Telephone Encounter (Signed)
Let's start losartan 25mg  daily instead, repeat BMET 1 week, and keep follow-up as planned.

## 2021-08-30 NOTE — Telephone Encounter (Signed)
Pt states that she feels okay.  Bp has been between 130/88 and 116/80 per pt.  Pt states that she cannot take Entresto. She feels that this medication and Jardiance lowers he bp too much. Pt stated that she also cannot afford these medications, so she threw them both away. Please advise.

## 2021-08-30 NOTE — Telephone Encounter (Signed)
Will fwd to provider for input.

## 2021-08-30 NOTE — Telephone Encounter (Signed)
Yes, OK to refill, thank you!  I also saw she had her nurse visit last week. For some reason I do not see this was routed to my box at the time. Please find out how patient is feeling. If she remains feeling good and BP is >120 systolic, would have her restart sacubitril/valsartan Sherryll Burger) at prior dose but stay off empaglifozin (Jardiance) for now. Would also hold off Lasix/potassium for now - Though not officially a water pill, Entresto can exert similar effects. She was only taking Lasix/potasium sporadically previously anyway. Recheck BMET 1 week. Notify for symptoms of fluid retention or weight gain.  If she is feeling back to normal, please let cardiac rehab team know she is OK to move forward with cardiac rehab. Keep f/u as planned.

## 2021-08-30 NOTE — Telephone Encounter (Signed)
Needing refill on Rx #: [id number]  metoprolol tartrate (LOPRESSOR) 25 MG tablet   sent to [facility] [address]  Still has hospital provider on the Rx

## 2021-08-30 NOTE — Telephone Encounter (Signed)
Informed pt that d/t low blood pressure she is unable to do cardiac rehab at this time. Pt thankful for the call back.

## 2021-08-31 NOTE — Telephone Encounter (Signed)
Pt returned your call.  

## 2021-08-31 NOTE — Telephone Encounter (Signed)
Lmfcb

## 2021-08-31 NOTE — Telephone Encounter (Signed)
Left a message for pt to give office a call back

## 2021-08-31 NOTE — Telephone Encounter (Signed)
Pt stated that her bp is fine. She does not feel the need to take anything else.

## 2021-08-31 NOTE — Telephone Encounter (Signed)
It is up to her whether she would like to take it or not. Losartan generally does not lower the blood pressure as much as the other medications would have together. The different heart medicines work in different ways with the goal of trying to improve her decreased heart pumping function. If she would prefer to start out with a 1/2 tablet (12.5mg  daily) that is fine too but would still need BMET 1 week. Otherwise if she chooses not to take that, that is completely her decision. Thank you!

## 2021-08-31 NOTE — Telephone Encounter (Signed)
Pt stated that she did not want to start Losartan 25 mg tablets because she is taking Metoprolol and does not want to lower her bp more. Please advise.

## 2021-09-01 ENCOUNTER — Other Ambulatory Visit: Payer: Self-pay | Admitting: Physician Assistant

## 2021-09-20 ENCOUNTER — Encounter: Payer: Self-pay | Admitting: Nurse Practitioner

## 2021-09-20 ENCOUNTER — Ambulatory Visit (INDEPENDENT_AMBULATORY_CARE_PROVIDER_SITE_OTHER): Payer: Medicare Other | Admitting: Nurse Practitioner

## 2021-09-20 ENCOUNTER — Other Ambulatory Visit: Payer: Self-pay

## 2021-09-20 VITALS — BP 138/82 | HR 80 | Ht 67.0 in | Wt 262.0 lb

## 2021-09-20 DIAGNOSIS — E785 Hyperlipidemia, unspecified: Secondary | ICD-10-CM

## 2021-09-20 DIAGNOSIS — I2510 Atherosclerotic heart disease of native coronary artery without angina pectoris: Secondary | ICD-10-CM

## 2021-09-20 DIAGNOSIS — I5022 Chronic systolic (congestive) heart failure: Secondary | ICD-10-CM

## 2021-09-20 DIAGNOSIS — I10 Essential (primary) hypertension: Secondary | ICD-10-CM

## 2021-09-20 DIAGNOSIS — I255 Ischemic cardiomyopathy: Secondary | ICD-10-CM

## 2021-09-20 NOTE — Patient Instructions (Signed)
Medication Instructions:  Your physician recommends that you continue on your current medications as directed. Please refer to the Current Medication list given to you today.  *If you need a refill on your cardiac medications before your next appointment, please call your pharmacy*   Lab Work: FASTING Lipids LFT's If you have labs (blood work) drawn today and your tests are completely normal, you will receive your results only by: MyChart Message (if you have MyChart) OR A paper copy in the mail If you have any lab test that is abnormal or we need to change your treatment, we will call you to review the results.   Testing/Procedures: Your physician has requested that you have an echocardiogram. Echocardiography is a painless test that uses sound waves to create images of your heart. It provides your doctor with information about the size and shape of your heart and how well your heart's chambers and valves are working. This procedure takes approximately one hour. There are no restrictions for this procedure.    Follow-Up: At [facility], you and your health needs are our priority.  As part of our continuing mission to provide you with exceptional heart care, we have created designated Provider Care Teams.  These Care Teams include your primary Cardiologist (physician) and Advanced Practice Providers (APPs -  Physician Assistants and Nurse Practitioners) who all work together to provide you with the care you need, when you need it.  We recommend signing up for the patient portal called "MyChart".  Sign up information is provided on this After Visit Summary.  MyChart is used to connect with patients for Virtual Visits (Telemedicine).  Patients are able to view lab/test results, encounter notes, upcoming appointments, etc.  Non-urgent messages can be sent to your provider as well.   To learn more about what you can do with MyChart, go to [email or web address].    Your next appointment:    8-10 week(s)  The format for your next appointment:   In Person  Provider:   You may see Nona Dell, MD or one of the following Advanced Practice Providers on your designated Care Team:   Randall An, PA-C  Jacolyn Reedy, [place]    Other Instructions

## 2021-09-20 NOTE — Progress Notes (Signed)
Office Visit    Patient Name: Annette Bryant Date of Encounter: 09/20/2021  Primary Care Provider:  Gareth Morgan, MD Primary Cardiologist:  Annette Dell, MD  Chief Complaint    60 year old female with a history of hypertension, sleep apnea on CPAP, obesity, anxiety, depression, and recently diagnosed CAD with late presenting MI (August 2022), ischemic cardiomyopathy, HFrEF, and prediabetes, who presents for follow-up related to CAD and heart failure.  Past Medical History    Past Medical History:  Diagnosis Date   Anxiety    Arthritis    Asthma    CAD (coronary artery disease)    a. 07/2021 NSTEMI/PCI: LM nl, LAD 21m (3.0x28 Synergy XD DES), LCX large, 40ost/p, RCA large, 150m CTA (unable to wire). Dist RCA filla via R->R collats from RV branch.   Carpal tunnel syndrome    Chronic combined systolic (congestive) and diastolic (congestive) heart failure (HCC)    a. 07/2021 Echo: EF 44%, sev asymm LVH, Gr1 DD, sev basal-mid inf AK. Nl RV size/fxn. Mild MR.   COPD (chronic obstructive pulmonary disease) (HCC)    Depression    Dyspnea    a. Adm 12/2013 for dyspnea/palps: normal stress-pics-only Lexiscan nuc, echo unremarkable with normal EF, d-dimer normal, ruled out for MI.   Elevated lipids    HLD (hyperlipidemia)    Hyperglycemia    Hypertension    Ischemic cardiomyopathy    a. 07/2021 Echo: EF 44%.   Mild mitral regurgitation    Morbid obesity (HCC)    Panic attacks    Pre-diabetes    Premature atrial contractions    Sleep apnea    uses CPAP   Past Surgical History:  Procedure Laterality Date   CESAREAN SECTION     CHOLECYSTECTOMY N/A 08/01/2017   Procedure: LAPAROSCOPIC CHOLECYSTECTOMY;  Surgeon: Manus Rudd, MD;  Location: [facility];  Service: General;  Laterality: N/A;   COLONOSCOPY WITH PROPOFOL N/A 11/21/2020   Procedure: COLONOSCOPY WITH PROPOFOL;  Surgeon: Corbin Ade, MD;  Location: [facility];  Service: Endoscopy;  Laterality: N/A;  12:00pm    CORONARY STENT INTERVENTION N/A 07/31/2021   Procedure: CORONARY STENT INTERVENTION;  Surgeon: Kathleene Hazel, MD;  Location: [facility];  Service: Cardiovascular;  Laterality: N/A;   LEFT HEART CATH AND CORONARY ANGIOGRAPHY N/A 07/31/2021   Procedure: LEFT HEART CATH AND CORONARY ANGIOGRAPHY;  Surgeon: Kathleene Hazel, MD;  Location: [facility];  Service: Cardiovascular;  Laterality: N/A;   TUBAL LIGATION      Allergies  No Known Allergies  History of Present Illness    60 year old female with a history of hypertension, sleep apnea on CPAP, obesity, anxiety, and depression.  In August 2022, she was hospitalized with a 2-day history of chest pain associate with nausea, diarrhea, and elbow pain.  Troponin was markedly elevated at 17,000.  She underwent diagnostic catheterization revealing a chronic total occlusion of the right coronary artery and severe mid LAD disease.  The LAD was successfully treated with drug-eluting stent while the RCA was unable to be successfully wired.  She did have elevated LVEDP and required intravenous Lasix.  Echocardiogram showed an EF of 44% with grade 1 diastolic dysfunction, severe basal-mid inferior wall akinesis, and mild mitral regurgitation.  She was placed on GDMT and subsequently discharged.  She followed up in clinic on September 12, at which time she reported experience chest pain in the waiting room.  She also complained of weakness.  She was mildly tachycardic (  104) and relatively hypotensive (98/70).  EKG was unremarkable.  Jardiance and Entresto were held, and it was up sequently recommended that she start low-dose losartan.  Since her last visit, she has felt much better.  Because of her experience with Jardiance and Sherryll Burger, she is not interested in trying an ACE/ARB or spironolactone.  We had a long discussion about the role of medical therapy in post MI care as well as ischemic cardiomyopathy/systolic heart failure.  She  has been walking regularly without chest pain or dyspnea.  She has been taking her Lasix daily but is willing to drop it back to as needed.  She is interested in participating in cardiac rehab.  She denies palpitations, PND, orthopnea, dizziness, syncope, edema, or early satiety.  Home Medications    Current Outpatient Medications  Medication Sig Dispense Refill   albuterol (PROVENTIL) (2.5 MG/3ML) 0.083% nebulizer solution Take 2.5 mg by nebulization every 6 (six) hours as needed for wheezing or shortness of breath.      ALPRAZolam (XANAX) 1 MG tablet Take 1 tablet (1 mg total) by mouth 2 (two) times daily. 60 tablet 2   aspirin 81 MG EC tablet Take 1 tablet (81 mg total) by mouth daily. Swallow whole. 90 tablet 3   budesonide-formoterol (SYMBICORT) 160-4.5 MCG/ACT inhaler Inhale 2 puffs into the lungs 2 (two) times daily.     cholecalciferol (VITAMIN D3) 25 MCG (1000 UNIT) tablet Take 1,000 Units by mouth daily.     furosemide (LASIX) 40 MG tablet Take 40 mg by mouth daily as needed for fluid or edema.      HYDROcodone-acetaminophen (NORCO) 7.5-325 MG tablet Take 0.5-1 tablets by mouth 3 (three) times daily as needed for severe pain.   0   metoprolol tartrate (LOPRESSOR) 25 MG tablet Take 1 tablet (25 mg total) by mouth 2 (two) times daily. 60 tablet 3   Multiple Vitamin (MULTIVITAMIN WITH MINERALS) TABS tablet Take 1 tablet by mouth daily.     nitroGLYCERIN (NITROSTAT) 0.4 MG SL tablet Place 1 tablet (0.4 mg total) under the tongue every 5 (five) minutes x 3 doses as needed for chest pain. 25 tablet 3   Polyethyl Glycol-Propyl Glycol (SYSTANE) 0.4-0.3 % SOLN Apply 1 drop to eye daily as needed (dry eyes).     Potassium Chloride ER 20 MEQ TBCR Take 1 tablet by mouth daily.     PROAIR HFA 108 (90 BASE) MCG/ACT inhaler Inhale 1-2 puffs into the lungs every 6 (six) hours as needed for wheezing or shortness of breath.     rosuvastatin (CRESTOR) 40 MG tablet Take 1 tablet (40 mg total) by mouth  daily. 90 tablet 3   ticagrelor (BRILINTA) 90 MG TABS tablet Take 1 tablet (90 mg total) by mouth 2 (two) times daily. 180 tablet 3   empagliflozin (JARDIANCE) 10 MG TABS tablet Take 1 tablet (10 mg total) by mouth daily. (Patient not taking: No sig reported) 30 tablet 6   KLOR-CON M20 20 MEQ tablet Take 20 mEq by mouth daily as needed (when taking furosemide). (Patient not taking: Reported on 09/20/2021)     sacubitril-valsartan (ENTRESTO) 24-26 MG Take 1 tablet by mouth 2 (two) times daily. (Patient not taking: No sig reported) 60 tablet 6   No current facility-administered medications for this visit.     Review of Systems    Feeling much better compared to her last visit.  She denies chest pain, palpitations, dyspnea, pnd, orthopnea, n, v, dizziness, syncope, edema, weight gain, or early  satiety.  All other systems reviewed and are otherwise negative except as noted above.  Physical Exam    VS:  BP 138/82   Pulse 80   Ht 5\' 7"  (1.702 m)   Wt 262 lb (118.8 kg)   LMP 07/11/2017 (Exact Date)   SpO2 98%   BMI 41.04 kg/m  , BMI Body mass index is 41.04 kg/m.     GEN: Obese, in no acute distress. HEENT: normal. Neck: Supple, no JVD, carotid bruits, or masses. Cardiac: RRR, no murmurs, rubs, or gallops. No clubbing, cyanosis, edema.  Radials/PT 2+ and equal bilaterally.  Respiratory:  Respirations regular and unlabored, clear to auscultation bilaterally. GI: Obese, soft, nontender, nondistended, BS + x 4. MS: no deformity or atrophy. Skin: warm and dry, no rash. Neuro:  Strength and sensation are intact. Psych: Normal affect.  Accessory Clinical Findings    Lab Results  Component Value Date   WBC 8.1 08/21/2021   HGB 13.6 08/21/2021   HCT 44.1 08/21/2021   MCV 85.6 08/21/2021   PLT 306 08/21/2021   Lab Results  Component Value Date   CREATININE 1.08 (H) 08/21/2021   BUN 18 08/21/2021   NA 137 08/21/2021   K 3.8 08/21/2021   CL 106 08/21/2021   CO2 25 08/21/2021    Lab Results  Component Value Date   ALT 26 07/28/2021   AST 114 (H) 07/28/2021   ALKPHOS 138 (H) 07/28/2021   BILITOT 1.1 07/28/2021   Lab Results  Component Value Date   CHOL 180 07/30/2021   HDL 39 (L) 07/30/2021   LDLCALC 112 (H) 07/30/2021   TRIG 147 07/30/2021   CHOLHDL 4.6 07/30/2021    Lab Results  Component Value Date   HGBA1C 6.4 (H) 07/29/2021    Assessment & Plan    1.  Coronary artery disease: Status post non-STEMI with PCI and drug-eluting stent placement to the LAD.  She also has a chronic total occlusion of the right coronary artery which the interventional team was not able to successfully wire, and thus this is being managed medically.  Since her last visit, now off of 07/31/2021, she notes significant improvement in energy.  She has been walking regularly without chest pain or dyspnea.  No recurrence of presyncope/weakness.  She remains on aspirin, Brilinta, beta-blocker, and high potency statin therapy.  She is eager to begin cardiac rehabilitation and I agree that she is now stable and ready to begin exercising at rehab.  2.  Ischemic cardiomyopathy/chronic heart failure with reduced ejection fraction: EF 44% by echocardiogram in the setting of her non-STEMI.  She did not tolerate Entresto and Jardiance in the setting of hypotension and profound fatigue with presyncope.  We discussed potentially adding a low-dose of losartan for the purpose of afterload reduction in the setting of chronic heart failure/LV dysfunction however, she is not interested in trying an additional medication due to her experience with Entresto/Jardiance.  We discussed the importance of daily weights, sodium restriction, medication compliance, and symptom reporting and she verbalizes understanding.  Of note, her Lasix is prescribed as needed however, she has been taking it daily.  I encouraged her to drop back to taking it on an as-needed basis only (with potassium).  I will arrange  for a follow-up echocardiogram in about another 4 to 6 weeks, once she gets the 90-day point following revascularization.  3.  Essential hypertension: Blood pressure mildly elevated today at 138/82.  In this setting, I offered to start  low-dose losartan however, as outlined above, she is not interested in retrying another medication.  She does have a cuff at home and I encouraged her to check her blood pressure.  4.  Hyperlipidemia: LDL was 112 at the time of her event in August.  Since then, she has been switched from atorvastatin 40 to rosuvastatin 40.  I will arrange for follow-up fasting lipids and LFTs at her convenience (she is not fasting today).  5.  Morbid obesity: Patient to enroll in cardiac rehab.  6.  Prediabetes: A1c 6.4 in August.  She is to enroll in cardiac rehab.  Encouraged weight loss.  7.  Disposition: Follow-up fasting lipids and LFTs at her convenience.  Follow-up echocardiogram in approximately 4 to 6 weeks.  Follow-up in cardiology clinic in about 3 months.   Nicolasa Ducking, NP 09/20/2021, 4:57 PM

## 2021-09-21 ENCOUNTER — Emergency Department (HOSPITAL_COMMUNITY): Payer: Medicare Other

## 2021-09-21 ENCOUNTER — Emergency Department (HOSPITAL_COMMUNITY)
Admission: EM | Admit: 2021-09-21 | Discharge: 2021-09-21 | Disposition: A | Discharge status: Home / Self Care | Payer: Medicare Other | Attending: Emergency Medicine | Admitting: Emergency Medicine

## 2021-09-21 ENCOUNTER — Encounter (HOSPITAL_COMMUNITY): Payer: Self-pay

## 2021-09-21 ENCOUNTER — Other Ambulatory Visit: Payer: Self-pay

## 2021-09-21 DIAGNOSIS — J45909 Unspecified asthma, uncomplicated: Secondary | ICD-10-CM | POA: Diagnosis not present

## 2021-09-21 DIAGNOSIS — I5041 Acute combined systolic (congestive) and diastolic (congestive) heart failure: Secondary | ICD-10-CM | POA: Diagnosis not present

## 2021-09-21 DIAGNOSIS — K439 Ventral hernia without obstruction or gangrene: Secondary | ICD-10-CM

## 2021-09-21 DIAGNOSIS — Z7984 Long term (current) use of oral hypoglycemic drugs: Secondary | ICD-10-CM | POA: Insufficient documentation

## 2021-09-21 DIAGNOSIS — J449 Chronic obstructive pulmonary disease, unspecified: Secondary | ICD-10-CM | POA: Diagnosis not present

## 2021-09-21 DIAGNOSIS — I2510 Atherosclerotic heart disease of native coronary artery without angina pectoris: Secondary | ICD-10-CM | POA: Diagnosis not present

## 2021-09-21 DIAGNOSIS — I110 Hypertensive heart disease with heart failure: Secondary | ICD-10-CM | POA: Insufficient documentation

## 2021-09-21 DIAGNOSIS — Z7982 Long term (current) use of aspirin: Secondary | ICD-10-CM | POA: Diagnosis not present

## 2021-09-21 DIAGNOSIS — Z7951 Long term (current) use of inhaled steroids: Secondary | ICD-10-CM | POA: Insufficient documentation

## 2021-09-21 DIAGNOSIS — Z79899 Other long term (current) drug therapy: Secondary | ICD-10-CM | POA: Insufficient documentation

## 2021-09-21 DIAGNOSIS — R1032 Left lower quadrant pain: Secondary | ICD-10-CM

## 2021-09-21 LAB — CBC WITH DIFFERENTIAL/PLATELET
Abs Immature Granulocytes: 0.04 10*3/uL (ref 0.00–0.07)
Basophils Absolute: 0 10*3/uL (ref 0.0–0.1)
Basophils Relative: 0 %
Eosinophils Absolute: 0.1 10*3/uL (ref 0.0–0.5)
Eosinophils Relative: 1 %
HCT: 42.2 % (ref 36.0–46.0)
Hemoglobin: 13.4 g/dL (ref 12.0–15.0)
Immature Granulocytes: 0 %
Lymphocytes Relative: 30 %
Lymphs Abs: 2.7 10*3/uL (ref 0.7–4.0)
MCH: 27.1 pg (ref 26.0–34.0)
MCHC: 31.8 g/dL (ref 30.0–36.0)
MCV: 85.4 fL (ref 80.0–100.0)
Monocytes Absolute: 0.4 10*3/uL (ref 0.1–1.0)
Monocytes Relative: 4 %
Neutro Abs: 5.9 10*3/uL (ref 1.7–7.7)
Neutrophils Relative %: 65 %
Platelets: 261 10*3/uL (ref 150–400)
RBC: 4.94 MIL/uL (ref 3.87–5.11)
RDW: 15.4 % (ref 11.5–15.5)
WBC: 9.2 10*3/uL (ref 4.0–10.5)
nRBC: 0 % (ref 0.0–0.2)

## 2021-09-21 LAB — COMPREHENSIVE METABOLIC PANEL
ALT: 16 U/L (ref 0–44)
AST: 17 U/L (ref 15–41)
Albumin: 3.8 g/dL (ref 3.5–5.0)
Alkaline Phosphatase: 114 U/L (ref 38–126)
Anion gap: 8 (ref 5–15)
BUN: 16 mg/dL (ref 6–20)
CO2: 26 mmol/L (ref 22–32)
Calcium: 9 mg/dL (ref 8.9–10.3)
Chloride: 103 mmol/L (ref 98–111)
Creatinine, Ser: 1.11 mg/dL — ABNORMAL HIGH (ref 0.44–1.00)
GFR, Estimated: 58 mL/min — ABNORMAL LOW (ref >60)
Glucose, Bld: 170 mg/dL — ABNORMAL HIGH (ref 70–99)
Potassium: 3.5 mmol/L (ref 3.5–5.1)
Sodium: 137 mmol/L (ref 135–145)
Total Bilirubin: 0.9 mg/dL (ref 0.3–1.2)
Total Protein: 7.9 g/dL (ref 6.5–8.1)

## 2021-09-21 LAB — URINALYSIS, ROUTINE W REFLEX MICROSCOPIC
Bilirubin Urine: NEGATIVE
Glucose, UA: NEGATIVE mg/dL
Hgb urine dipstick: NEGATIVE
Ketones, ur: NEGATIVE mg/dL
Leukocytes,Ua: NEGATIVE
Nitrite: NEGATIVE
Protein, ur: NEGATIVE mg/dL
Specific Gravity, Urine: 1.035 — ABNORMAL HIGH (ref 1.005–1.030)
pH: 5 (ref 5.0–8.0)

## 2021-09-21 LAB — LIPASE, BLOOD: Lipase: 30 U/L (ref 11–51)

## 2021-09-21 MED ORDER — IOHEXOL 300 MG/ML  SOLN
100.0000 mL | Freq: Once | INTRAMUSCULAR | Status: AC | PRN
  Administered 2021-09-21: 100 mL via INTRAVENOUS

## 2021-09-21 MED ORDER — HYDROCODONE-ACETAMINOPHEN 5-325 MG PO TABS: 1.0000 | ORAL_TABLET | ORAL | 0 refills | Status: DC | PRN

## 2021-09-21 MED ORDER — SODIUM CHLORIDE 0.9 % IV BOLUS
1000.0000 mL | Freq: Once | INTRAVENOUS | Status: AC
  Administered 2021-09-21: 1000 mL via INTRAVENOUS

## 2021-09-21 MED ORDER — DICYCLOMINE HCL 20 MG PO TABS
20.0000 mg | ORAL_TABLET | Freq: Two times a day (BID) | ORAL | 0 refills | Status: AC
Start: 2021-09-21 — End: ?

## 2021-09-21 NOTE — ED Provider Notes (Signed)
[facility] EMERGENCY DEPARTMENT Provider Note   CSN: [id number] Arrival date & time: 09/21/21  0559     History Chief Complaint  Patient presents with   Abdominal Pain    LLQ x 4 days    Annette Bryant is a 60 y.o. female.  Patient is a 60 year old female with past medical history of coronary artery disease, COPD, hypertension.  Patient presenting today for evaluation of left lower quadrant abdominal pain.  This has been present for the past 3 or 4 days.  She does describe stools which are more loose and less formed than normal.  She denies any bloody stool.  She denies any fevers or chills.  Pain is worse with changing position and ambulating.  It is also worse when she pushes on the area.  She denies any urinary complaints.  The history is provided by the patient.  Abdominal Pain Pain location:  LLQ Pain quality: cramping   Pain radiates to:  Does not radiate Pain severity:  Moderate Onset quality:  Gradual Duration:  4 days Timing:  Constant Progression:  Worsening     Past Medical History:  Diagnosis Date   Anxiety    Arthritis    Asthma    CAD (coronary artery disease)    a. 07/2021 NSTEMI/PCI: LM nl, LAD 66m (3.0x28 Synergy XD DES), LCX large, 40ost/p, RCA large, 171m CTA (unable to wire). Dist RCA filla via R->R collats from RV branch.   Carpal tunnel syndrome    Chronic combined systolic (congestive) and diastolic (congestive) heart failure (HCC)    a. 07/2021 Echo: EF 44%, sev asymm LVH, Gr1 DD, sev basal-mid inf AK. Nl RV size/fxn. Mild MR.   COPD (chronic obstructive pulmonary disease) (HCC)    Depression    Dyspnea    a. Adm 12/2013 for dyspnea/palps: normal stress-pics-only Lexiscan nuc, echo unremarkable with normal EF, d-dimer normal, ruled out for MI.   Elevated lipids    HLD (hyperlipidemia)    Hyperglycemia    Hypertension    Ischemic cardiomyopathy    a. 07/2021 Echo: EF 44%.   Mild mitral regurgitation    Morbid obesity (HCC)    Panic attacks     Pre-diabetes    Premature atrial contractions    Sleep apnea    uses CPAP    Patient Active Problem List   Diagnosis Date Noted   Ischemic cardiomyopathy 08/01/2021   Acute combined systolic and diastolic CHF, NYHA class 2 (HCC) 08/01/2021   Coronary artery disease 08/01/2021   NSTEMI (non-ST elevated myocardial infarction) (HCC) 07/29/2021   Colon cancer screening 09/02/2020   Taking multiple medications for chronic disease 07/10/2018   Right hand pain 01/19/2016   Left hand pain 01/19/2016   Bilateral hand pain 01/19/2016   Morbid obesity (HCC)    Hyperlipidemia LDL goal <70    Hyperglycemia    Hypertension    Depression    OSA on CPAP    Dyspnea 12/11/2013   Heart palpitations 12/11/2013   Dizziness 12/11/2013    Past Surgical History:  Procedure Laterality Date   CESAREAN SECTION     CHOLECYSTECTOMY N/A 08/01/2017   Procedure: LAPAROSCOPIC CHOLECYSTECTOMY;  Surgeon: Manus Rudd, MD;  Location: [facility];  Service: General;  Laterality: N/A;   COLONOSCOPY WITH PROPOFOL N/A 11/21/2020   Procedure: COLONOSCOPY WITH PROPOFOL;  Surgeon: Corbin Ade, MD;  Location: [facility];  Service: Endoscopy;  Laterality: N/A;  12:00pm   CORONARY STENT INTERVENTION N/A 07/31/2021   Procedure:  CORONARY STENT INTERVENTION;  Surgeon: Kathleene Hazel, MD;  Location: [facility];  Service: Cardiovascular;  Laterality: N/A;   LEFT HEART CATH AND CORONARY ANGIOGRAPHY N/A 07/31/2021   Procedure: LEFT HEART CATH AND CORONARY ANGIOGRAPHY;  Surgeon: Kathleene Hazel, MD;  Location: [facility];  Service: Cardiovascular;  Laterality: N/A;   TUBAL LIGATION       OB History   No obstetric history on file.     Family History  Problem Relation Age of Onset   Coronary artery disease Sister 38       stents   Depression Sister    Coronary artery disease Sister    Heart attack Sister 68       CHF, MI   Schizophrenia Brother    Alcohol abuse Brother     Depression Mother    Alcohol abuse Father    Colon cancer Neg Hx     Social History   Tobacco Use   Smoking status: Never   Smokeless tobacco: Never  Vaping Use   Vaping Use: Never used  Substance Use Topics   Alcohol use: No   Drug use: No    Home Medications Prior to Admission medications   Medication Sig Start Date End Date Taking? Authorizing Provider  albuterol (PROVENTIL) (2.5 MG/3ML) 0.083% nebulizer solution Take 2.5 mg by nebulization every 6 (six) hours as needed for wheezing or shortness of breath.     [provider]  ALPRAZolam Prudy Feeler) 1 MG tablet Take 1 tablet (1 mg total) by mouth 2 (two) times daily. 10/29/17   Myrlene Broker, MD  aspirin 81 MG EC tablet Take 1 tablet (81 mg total) by mouth daily. Swallow whole. 08/02/21   Kroeger, Ovidio Kin., PA-C  budesonide-formoterol [facility]) 160-4.5 MCG/ACT inhaler Inhale 2 puffs into the lungs 2 (two) times daily.    [provider]  cholecalciferol (VITAMIN D3) 25 MCG (1000 UNIT) tablet Take 1,000 Units by mouth daily.    [provider]  empagliflozin (JARDIANCE) 10 MG TABS tablet Take 1 tablet (10 mg total) by mouth daily. Patient not taking: No sig reported 08/02/21   Beatriz Stallion., PA-C  furosemide (LASIX) 40 MG tablet Take 40 mg by mouth daily as needed for fluid or edema.     [provider]  HYDROcodone-acetaminophen (NORCO) 7.5-325 MG tablet Take 0.5-1 tablets by mouth 3 (three) times daily as needed for severe pain.  10/12/17   [provider]  KLOR-CON M20 20 MEQ tablet Take 20 mEq by mouth daily as needed (when taking furosemide). Patient not taking: Reported on 09/20/2021 08/21/20   [provider]  metoprolol tartrate (LOPRESSOR) 25 MG tablet Take 1 tablet (25 mg total) by mouth 2 (two) times daily. 08/30/21   Dunn, Tacey Ruiz, PA-C  Multiple Vitamin (MULTIVITAMIN WITH MINERALS) TABS tablet Take 1 tablet by mouth daily.    [provider]  nitroGLYCERIN  (NITROSTAT) 0.4 MG SL tablet Place 1 tablet (0.4 mg total) under the tongue every 5 (five) minutes x 3 doses as needed for chest pain. 08/01/21   Kroeger, Ovidio Kin., PA-C  Polyethyl Glycol-Propyl Glycol (SYSTANE) 0.4-0.3 % SOLN Apply 1 drop to eye daily as needed (dry eyes).    [provider]  Potassium Chloride ER 20 MEQ TBCR Take 1 tablet by mouth daily. 09/06/21   [provider]  PROAIR HFA 108 (90 BASE) MCG/ACT inhaler Inhale 1-2 puffs into the lungs every 6 (six) hours as needed  for wheezing or shortness of breath. 06/27/15   [provider]  rosuvastatin (CRESTOR) 40 MG tablet Take 1 tablet (40 mg total) by mouth daily. 08/02/21   Kroeger, Ovidio Kin., PA-C  sacubitril-valsartan (ENTRESTO) 24-26 MG Take 1 tablet by mouth 2 (two) times daily. Patient not taking: No sig reported 08/01/21   Beatriz Stallion., PA-C  ticagrelor (BRILINTA) 90 MG TABS tablet Take 1 tablet (90 mg total) by mouth 2 (two) times daily. 08/01/21   Kroeger, Ovidio Kin., PA-C    Allergies    Patient has no known allergies.  Review of Systems   Review of Systems  Gastrointestinal:  Positive for abdominal pain.  All other systems reviewed and are negative.  Physical Exam Updated Vital Signs BP 113/81   Pulse 86   Temp 98 F (36.7 C) (Oral)   Resp 19   Ht 5\' 7"  (1.702 m)   Wt 118.8 kg   LMP 07/11/2017 (Exact Date)   SpO2 99%   BMI 41.03 kg/m   Physical Exam Vitals and nursing note reviewed.  Constitutional:      General: She is not in acute distress.    Appearance: She is well-developed. She is not diaphoretic.  HENT:     Head: Normocephalic and atraumatic.  Cardiovascular:     Rate and Rhythm: Normal rate and regular rhythm.     Heart sounds: No murmur heard.   No friction rub. No gallop.  Pulmonary:     Effort: Pulmonary effort is normal. No respiratory distress.     Breath sounds: Normal breath sounds. No wheezing.  Abdominal:     General: Bowel sounds are normal. There is  no distension.     Palpations: Abdomen is soft.     Tenderness: There is abdominal tenderness in the left lower quadrant. There is no right CVA tenderness, left CVA tenderness, guarding or rebound.  Musculoskeletal:        General: Normal range of motion.     Cervical back: Normal range of motion and neck supple.  Skin:    General: Skin is warm and dry.  Neurological:     General: No focal deficit present.     Mental Status: She is alert and oriented to person, place, and time.    ED Results / Procedures / Treatments   Labs (all labs ordered are listed, but only abnormal results are displayed) Labs Reviewed - No data to display  EKG None  Radiology No results found.  Procedures Procedures   Medications Ordered in ED Medications - No data to display  ED Course  I have reviewed the triage vital signs and the nursing notes.  Pertinent labs & imaging results that were available during my care of the patient were reviewed by me and considered in my medical decision making (see chart for details).    MDM Rules/Calculators/A&P  Patient presenting with a 4-day history of left lower quadrant pain.  Differential includes diverticulitis, ovarian pathology, UTI.  Laboratory studies pending.  Patient will undergo CT scan.  Care signed out to oncoming provider to obtain the results of the above studies and determine the final disposition.  Final Clinical Impression(s) / ED Diagnoses Final diagnoses:  None    Rx / DC Orders ED Discharge Orders     None        09/10/2017, MD 09/21/21 [phone]

## 2021-09-21 NOTE — ED Triage Notes (Signed)
Pt reports LLQ pain x 4 days, pt also reports BM's have not been formed, but loose stools. No other sx reported. No fever.

## 2021-09-21 NOTE — ED Notes (Signed)
Advised patient we needed urine specimen.  Patient stated she needed something to drink in order to provide urine specimen.  Patient given cup of water and advised to ring cal bell when she needed to go to bathroom.

## 2021-09-21 NOTE — ED Provider Notes (Signed)
Pt signed out by Dr. Judd Lien pending labs/ua/ct.  Labs unremarkable other than glucose 170.  Urine nl.   Ct shows a ventral hernia, but no obstruction.  Pt unaware she had a hernia.  No diverticulitis.  Pt is feeling better.  She is stable for d/c.  She is to f/u with pcp.  Return if worse.   Jacalyn Lefevre, MD 09/21/21 1106

## 2021-09-22 ENCOUNTER — Other Ambulatory Visit (HOSPITAL_COMMUNITY)
Admission: RE | Admit: 2021-09-22 | Discharge: 2021-09-22 | Disposition: A | Discharge status: Home / Self Care | Payer: Medicare Other | Source: Ambulatory Visit | Attending: Nurse Practitioner | Admitting: Nurse Practitioner

## 2021-09-22 ENCOUNTER — Ambulatory Visit (HOSPITAL_COMMUNITY): Payer: Medicare Other

## 2021-09-22 DIAGNOSIS — I2510 Atherosclerotic heart disease of native coronary artery without angina pectoris: Secondary | ICD-10-CM

## 2021-09-22 LAB — HEPATIC FUNCTION PANEL
ALT: 14 U/L (ref 0–44)
AST: 14 U/L — ABNORMAL LOW (ref 15–41)
Albumin: 3.8 g/dL (ref 3.5–5.0)
Alkaline Phosphatase: 119 U/L (ref 38–126)
Bilirubin, Direct: 0.1 mg/dL (ref 0.0–0.2)
Indirect Bilirubin: 0.7 mg/dL (ref 0.3–0.9)
Total Bilirubin: 0.8 mg/dL (ref 0.3–1.2)
Total Protein: 7.7 g/dL (ref 6.5–8.1)

## 2021-09-22 LAB — LIPID PANEL
Cholesterol: 92 mg/dL (ref 0–200)
HDL: 29 mg/dL — ABNORMAL LOW (ref >40)
LDL Cholesterol: 42 mg/dL (ref 0–99)
Total CHOL/HDL Ratio: 3.2 RATIO
Triglycerides: 104 mg/dL (ref <150)
VLDL: 21 mg/dL (ref 0–40)

## 2021-09-25 ENCOUNTER — Ambulatory Visit (HOSPITAL_COMMUNITY)
Admission: RE | Admit: 2021-09-25 | Discharge: 2021-09-25 | Disposition: A | Discharge status: Home / Self Care | Payer: Medicare Other | Source: Ambulatory Visit | Attending: Family Medicine | Admitting: Family Medicine

## 2021-09-25 ENCOUNTER — Other Ambulatory Visit: Payer: Self-pay

## 2021-09-25 DIAGNOSIS — Z1231 Encounter for screening mammogram for malignant neoplasm of breast: Secondary | ICD-10-CM | POA: Insufficient documentation

## 2021-09-26 ENCOUNTER — Other Ambulatory Visit (HOSPITAL_COMMUNITY): Payer: Self-pay | Admitting: Family Medicine

## 2021-09-26 DIAGNOSIS — R928 Other abnormal and inconclusive findings on diagnostic imaging of breast: Secondary | ICD-10-CM

## 2021-09-27 ENCOUNTER — Other Ambulatory Visit (HOSPITAL_COMMUNITY): Payer: Self-pay | Admitting: Family Medicine

## 2021-09-27 DIAGNOSIS — R928 Other abnormal and inconclusive findings on diagnostic imaging of breast: Secondary | ICD-10-CM

## 2021-10-04 ENCOUNTER — Ambulatory Visit (HOSPITAL_COMMUNITY)
Admission: RE | Admit: 2021-10-04 | Discharge: 2021-10-04 | Disposition: A | Discharge status: Home / Self Care | Payer: Medicare Other | Source: Ambulatory Visit | Attending: Family Medicine | Admitting: Family Medicine

## 2021-10-04 ENCOUNTER — Other Ambulatory Visit: Payer: Self-pay

## 2021-10-04 DIAGNOSIS — R928 Other abnormal and inconclusive findings on diagnostic imaging of breast: Secondary | ICD-10-CM

## 2021-10-24 ENCOUNTER — Inpatient Hospital Stay (HOSPITAL_COMMUNITY): Admission: RE | Admit: 2021-10-24 | Payer: Medicare Other | Source: Ambulatory Visit

## 2021-10-24 ENCOUNTER — Ambulatory Visit (HOSPITAL_COMMUNITY): Payer: Medicare Other

## 2021-10-24 ENCOUNTER — Encounter (HOSPITAL_COMMUNITY): Payer: Self-pay

## 2021-10-25 ENCOUNTER — Encounter (HOSPITAL_COMMUNITY): Payer: Self-pay

## 2021-10-25 ENCOUNTER — Emergency Department (HOSPITAL_COMMUNITY)
Admission: EM | Admit: 2021-10-25 | Discharge: 2021-10-25 | Disposition: A | Discharge status: Home / Self Care | Payer: Medicare Other | Attending: Emergency Medicine | Admitting: Emergency Medicine

## 2021-10-25 DIAGNOSIS — I2510 Atherosclerotic heart disease of native coronary artery without angina pectoris: Secondary | ICD-10-CM | POA: Diagnosis not present

## 2021-10-25 DIAGNOSIS — I5041 Acute combined systolic (congestive) and diastolic (congestive) heart failure: Secondary | ICD-10-CM | POA: Insufficient documentation

## 2021-10-25 DIAGNOSIS — Z7982 Long term (current) use of aspirin: Secondary | ICD-10-CM | POA: Insufficient documentation

## 2021-10-25 DIAGNOSIS — Z79899 Other long term (current) drug therapy: Secondary | ICD-10-CM | POA: Diagnosis not present

## 2021-10-25 DIAGNOSIS — J45909 Unspecified asthma, uncomplicated: Secondary | ICD-10-CM | POA: Insufficient documentation

## 2021-10-25 DIAGNOSIS — J449 Chronic obstructive pulmonary disease, unspecified: Secondary | ICD-10-CM | POA: Diagnosis not present

## 2021-10-25 DIAGNOSIS — G8929 Other chronic pain: Secondary | ICD-10-CM | POA: Insufficient documentation

## 2021-10-25 DIAGNOSIS — I110 Hypertensive heart disease with heart failure: Secondary | ICD-10-CM | POA: Insufficient documentation

## 2021-10-25 DIAGNOSIS — M25561 Pain in right knee: Secondary | ICD-10-CM | POA: Diagnosis present

## 2021-10-25 NOTE — ED Triage Notes (Signed)
Pt complaining of right knee pain, needs a cortisone shot and been taking pain meds and needs it now.  PCP told her to come here.

## 2021-10-25 NOTE — ED Provider Notes (Signed)
[facility] EMERGENCY DEPARTMENT Provider Note   CSN: [id number] Arrival date & time: 10/25/21  1126     History No chief complaint on file.   Annette Bryant is a 60 y.o. female.  60 year old female with history of chronic right knee pain presents with request for steroid injection into her knee.  Patient sees Dr. Romeo Apple locally however he is out of town, she called the office to do this in the office and was told that she could come to the emergency room.  Patient is taking her hydrocodone without improvement in her pain.  No falls, no injuries, no changes in her chronic anterior right knee pain, denies redness.  No other complaints or concerns.      Past Medical History:  Diagnosis Date   Anxiety    Arthritis    Asthma    CAD (coronary artery disease)    a. 07/2021 NSTEMI/PCI: LM nl, LAD 39m (3.0x28 Synergy XD DES), LCX large, 40ost/p, RCA large, 135m CTA (unable to wire). Dist RCA filla via R->R collats from RV branch.   Carpal tunnel syndrome    Chronic combined systolic (congestive) and diastolic (congestive) heart failure (HCC)    a. 07/2021 Echo: EF 44%, sev asymm LVH, Gr1 DD, sev basal-mid inf AK. Nl RV size/fxn. Mild MR.   COPD (chronic obstructive pulmonary disease) (HCC)    Depression    Dyspnea    a. Adm 12/2013 for dyspnea/palps: normal stress-pics-only Lexiscan nuc, echo unremarkable with normal EF, d-dimer normal, ruled out for MI.   Elevated lipids    HLD (hyperlipidemia)    Hyperglycemia    Hypertension    Ischemic cardiomyopathy    a. 07/2021 Echo: EF 44%.   Mild mitral regurgitation    Morbid obesity (HCC)    Panic attacks    Pre-diabetes    Premature atrial contractions    Sleep apnea    uses CPAP    Patient Active Problem List   Diagnosis Date Noted   Ischemic cardiomyopathy 08/01/2021   Acute combined systolic and diastolic CHF, NYHA class 2 (HCC) 08/01/2021   Coronary artery disease 08/01/2021   NSTEMI (non-ST elevated myocardial infarction)  (HCC) 07/29/2021   Colon cancer screening 09/02/2020   Taking multiple medications for chronic disease 07/10/2018   Right hand pain 01/19/2016   Left hand pain 01/19/2016   Bilateral hand pain 01/19/2016   Morbid obesity (HCC)    Hyperlipidemia LDL goal <70    Hyperglycemia    Hypertension    Depression    OSA on CPAP    Dyspnea 12/11/2013   Heart palpitations 12/11/2013   Dizziness 12/11/2013    Past Surgical History:  Procedure Laterality Date   CESAREAN SECTION     CHOLECYSTECTOMY N/A 08/01/2017   Procedure: LAPAROSCOPIC CHOLECYSTECTOMY;  Surgeon: Manus Rudd, MD;  Location: [facility];  Service: General;  Laterality: N/A;   COLONOSCOPY WITH PROPOFOL N/A 11/21/2020   Procedure: COLONOSCOPY WITH PROPOFOL;  Surgeon: Corbin Ade, MD;  Location: [facility] SUITE;  Service: Endoscopy;  Laterality: N/A;  12:00pm   CORONARY STENT INTERVENTION N/A 07/31/2021   Procedure: CORONARY STENT INTERVENTION;  Surgeon: Kathleene Hazel, MD;  Location: [facility] INVASIVE CV LAB;  Service: Cardiovascular;  Laterality: N/A;   LEFT HEART CATH AND CORONARY ANGIOGRAPHY N/A 07/31/2021   Procedure: LEFT HEART CATH AND CORONARY ANGIOGRAPHY;  Surgeon: Kathleene Hazel, MD;  Location: [facility] INVASIVE CV LAB;  Service: Cardiovascular;  Laterality: N/A;   TUBAL LIGATION  OB History   No obstetric history on file.     Family History  Problem Relation Age of Onset   Coronary artery disease Sister 70       stents   Depression Sister    Coronary artery disease Sister    Heart attack Sister 80       CHF, MI   Schizophrenia Brother    Alcohol abuse Brother    Depression Mother    Alcohol abuse Father    Colon cancer Neg Hx     Social History   Tobacco Use   Smoking status: Never   Smokeless tobacco: Never  Vaping Use   Vaping Use: Never used  Substance Use Topics   Alcohol use: No   Drug use: No    Home Medications Prior to Admission medications   Medication Sig Start Date End  Date Taking? Authorizing Provider  albuterol (PROVENTIL) (2.5 MG/3ML) 0.083% nebulizer solution Take 2.5 mg by nebulization every 6 (six) hours as needed for wheezing or shortness of breath.     [provider]  ALPRAZolam Prudy Feeler) 1 MG tablet Take 1 tablet (1 mg total) by mouth 2 (two) times daily. 10/29/17   Myrlene Broker, MD  aspirin 81 MG EC tablet Take 1 tablet (81 mg total) by mouth daily. Swallow whole. 08/02/21   Kroeger, Ovidio Kin., PA-C  budesonide-formoterol [facility]) 160-4.5 MCG/ACT inhaler Inhale 2 puffs into the lungs 2 (two) times daily.    [provider]  cholecalciferol (VITAMIN D3) 25 MCG (1000 UNIT) tablet Take 1,000 Units by mouth daily.    [provider]  dicyclomine (BENTYL) 20 MG tablet Take 1 tablet (20 mg total) by mouth 2 (two) times daily. 09/21/21   Jacalyn Lefevre, MD  empagliflozin (JARDIANCE) 10 MG TABS tablet Take 1 tablet (10 mg total) by mouth daily. Patient not taking: No sig reported 08/02/21   Beatriz Stallion., PA-C  furosemide (LASIX) 40 MG tablet Take 40 mg by mouth daily as needed for fluid or edema.     [provider]  HYDROcodone-acetaminophen (NORCO/VICODIN) 5-325 MG tablet Take 1 tablet by mouth every 4 (four) hours as needed. 09/21/21   Jacalyn Lefevre, MD  KLOR-CON M20 20 MEQ tablet Take 20 mEq by mouth daily as needed (when taking furosemide). 08/21/20   [provider]  metoprolol tartrate (LOPRESSOR) 25 MG tablet Take 1 tablet (25 mg total) by mouth 2 (two) times daily. 08/30/21   Dunn, Tacey Ruiz, PA-C  Multiple Vitamin (MULTIVITAMIN WITH MINERALS) TABS tablet Take 1 tablet by mouth daily.    [provider]  nitroGLYCERIN (NITROSTAT) 0.4 MG SL tablet Place 1 tablet (0.4 mg total) under the tongue every 5 (five) minutes x 3 doses as needed for chest pain. 08/01/21   Kroeger, Ovidio Kin., PA-C  Polyethyl Glycol-Propyl Glycol (SYSTANE) 0.4-0.3 % SOLN Apply 1 drop to eye daily as needed (dry eyes).     [provider]  PROAIR HFA 108 (90 BASE) MCG/ACT inhaler Inhale 1-2 puffs into the lungs every 6 (six) hours as needed for wheezing or shortness of breath. 06/27/15   [provider]  rosuvastatin (CRESTOR) 40 MG tablet Take 1 tablet (40 mg total) by mouth daily. 08/02/21   Kroeger, Ovidio Kin., PA-C  sacubitril-valsartan (ENTRESTO) 24-26 MG Take 1 tablet by mouth 2 (two) times daily. Patient not taking: No sig reported 08/01/21   Beatriz Stallion., PA-C  ticagrelor (BRILINTA) 90 MG TABS tablet Take 1 tablet (90 mg  total) by mouth 2 (two) times daily. 08/01/21   Kroeger, Ovidio Kin., PA-C    Allergies    Patient has no known allergies.  Review of Systems   Review of Systems  Constitutional:  Negative for fever.  Musculoskeletal:  Positive for arthralgias and gait problem. Negative for joint swelling.  Skin:  Negative for color change, rash and wound.  Neurological:  Negative for weakness and numbness.   Physical Exam Updated Vital Signs BP (!) 134/95 (BP Location: Right Arm)   Pulse 83   Temp 98.3 F (36.8 C) (Oral)   Resp 15   LMP 07/11/2017 (Exact Date)   SpO2 93%   Physical Exam Vitals and nursing note reviewed.  Constitutional:      General: She is not in acute distress.    Appearance: She is well-developed. She is not diaphoretic.  HENT:     Head: Normocephalic and atraumatic.  Cardiovascular:     Pulses: Normal pulses.  Pulmonary:     Effort: Pulmonary effort is normal.  Musculoskeletal:        General: Tenderness present. No swelling.  Skin:    General: Skin is warm and dry.     Findings: No erythema or rash.  Neurological:     Mental Status: She is alert and oriented to person, place, and time.     Sensory: No sensory deficit.     Motor: No weakness.  Psychiatric:        Behavior: Behavior normal.    ED Results / Procedures / Treatments   Labs (all labs ordered are listed, but only abnormal results are displayed) Labs Reviewed - No data to  display  EKG None  Radiology No results found.  Procedures Procedures   Medications Ordered in ED Medications - No data to display  ED Course  I have reviewed the triage vital signs and the nursing notes.  Pertinent labs & imaging results that were available during my care of the patient were reviewed by me and considered in my medical decision making (see chart for details).  Clinical Course as of 10/25/21 1248  Wed Oct 25, 2021  [id number] 59 year old female presents with request for cortisone injection in the knee as above.  Found to have anterior right knee pain, no joint line tenderness, no effusion, no erythema.  Offered oral pain medication or topical however unable to do cortisone injection in the emergency room.  Patient is upset as she was sent here specifically for this.  Declines any other treatment options through the emergency room.  I contacted [facility] regarding urgent care services and was informed this can be done through their urgent care today.  Patient is advised to present to [facility] for further evaluation and care. [LM]    Clinical Course User Index [LM] Alden Hipp   MDM Rules/Calculators/A&P                           Final Clinical Impression(s) / ED Diagnoses Final diagnoses:  Chronic pain of right knee    Rx / DC Orders ED Discharge Orders     None        Jeannie Fend, PA-C 10/25/21 1248    Pollyann Savoy, MD 10/25/21 [phone]

## 2021-10-26 ENCOUNTER — Other Ambulatory Visit: Payer: Self-pay

## 2021-10-26 ENCOUNTER — Encounter: Payer: Self-pay | Admitting: Orthopaedic Surgery

## 2021-10-26 ENCOUNTER — Ambulatory Visit: Payer: Medicare Other

## 2021-10-26 ENCOUNTER — Ambulatory Visit (INDEPENDENT_AMBULATORY_CARE_PROVIDER_SITE_OTHER): Payer: Medicare Other | Admitting: Orthopaedic Surgery

## 2021-10-26 VITALS — BP 136/84 | HR 98 | Ht 67.0 in | Wt 260.0 lb

## 2021-10-26 DIAGNOSIS — M25561 Pain in right knee: Secondary | ICD-10-CM

## 2021-10-26 DIAGNOSIS — G8929 Other chronic pain: Secondary | ICD-10-CM | POA: Diagnosis not present

## 2021-10-26 NOTE — Progress Notes (Signed)
My right knee hurts  She has developed pain and swelling of the right knee over the last six weeks or so.  She has popping but no giving way. She has no trauma. She has seen Dr. Romeo Apple in the past and I have reviewed the notes.  She has no redness or numbness.  She cannot take NSAIDs.  She had MI in August.  Right knee has effusion, crepitus, ROM 0 to 105, medial joint line pain, stable.  NV intact.  X-rays were done of the right knee, reported separately.  Encounter Diagnosis  Name Primary?   Chronic pain of right knee Yes   PROCEDURE NOTE:  The patient requests injections of the right knee , verbal consent was obtained.  The right knee was prepped appropriately after time out was performed.   Sterile technique was observed and injection of 1 cc of DepoMedrol 40mg  with several cc's of plain xylocaine. Anesthesia was provided by ethyl chloride and a 20-gauge needle was used to inject the knee area. The injection was tolerated well.  A band aid dressing was applied.  The patient was advised to apply ice later today and tomorrow to the injection sight as needed.   Return in three weeks.  Call if any problem.  Precautions discussed.  Electronically Signed , MD 11/17/20229:24 AM

## 2021-10-30 ENCOUNTER — Telehealth: Payer: Self-pay | Admitting: Cardiology

## 2021-10-30 MED ORDER — ROSUVASTATIN CALCIUM 40 MG PO TABS: 40.0000 mg | ORAL_TABLET | Freq: Every day | ORAL | 3 refills | Status: DC

## 2021-10-30 MED ORDER — TICAGRELOR 90 MG PO TABS: 90.0000 mg | ORAL_TABLET | Freq: Two times a day (BID) | ORAL | 3 refills | Status: DC

## 2021-10-30 NOTE — Telephone Encounter (Signed)
90 day refills of both brilinta and crestor e-scribed to [facility]

## 2021-10-30 NOTE — Telephone Encounter (Signed)
*  STAT* If patient is at the pharmacy, call can be transferred to refill team.   1. Which medications need to be refilled? (please list name of each medication and dose if known)  ticagrelor (BRILINTA) 90 MG TABS tablet, rosuvastatin (CRESTOR) 40 MG tablet  2. Which pharmacy/location (including street and city if local pharmacy) is medication to be sent to? [id number] - [place], [place] - [address] AT [facility] [facility] [facility]  3. Do they need a 30 day or 90 day supply? 90 DS

## 2021-10-30 NOTE — Telephone Encounter (Signed)
Pt c/o medication issue:  1. Name of Medication: Brilinta & Rosuvastatin  2. How are you currently taking this medication (dosage and times per day)? Currently out of medication   3. Are you having a reaction (difficulty breathing--STAT)? no  4. What is your medication issue? Pt would like to know if she will be ok without medication until her rx is sent over to the pharmacy... please advise

## 2021-10-30 NOTE — Telephone Encounter (Signed)
I spoke with patient and she just ran out this am. [facility], 90 day with RF:3 crestor and brilinta

## 2021-11-01 ENCOUNTER — Ambulatory Visit (HOSPITAL_COMMUNITY): Payer: Medicare Other

## 2021-11-06 NOTE — Progress Notes (Signed)
Cardiology Office Note    Date:  11/13/2021   ID:  Annette Bryant, DOB 08-Jan-1961, MRN [id number]   PCP:  Gareth Morgan, MD   [facility]  Cardiologist:  Nona Dell, MD   Advanced Practice Provider:  No care team member to display Electrophysiologist:  None   [phone]   Chief Complaint  Patient presents with   Follow-up     History of Present Illness:  Annette Bryant is a 60 y.o. female with history of hypertension, HLD OSA on CPAP, obesity, anxiety depression, CAD with late presenting NSTEMI 07/2021 treated with DES to the LAD, CTO of the RCA-unable to wire, ischemic cardiomyopathy EF 44% on echo 07/2021  She developed hypotension and mild tachycardia on follow-up in September and Jardiance and [place] were held.  Patient saw Mr. Brion Aliment NP 09/20/2021 at which time she was not willing to try Jardiance or Entresto again.  She did not want to start low-dose losartan.  Patient is doing cardiac rehab and doing well. No shortness of breath, chest pain, edema. Taking lasix about 3 times/week. BP running 120-130/70. Limited echo cancelled per patient. Ran out of brilinta and metoprolol for a day.   Past Medical History:  Diagnosis Date   Anxiety    Arthritis    Asthma    CAD (coronary artery disease)    a. 07/2021 NSTEMI/PCI: LM nl, LAD 61m (3.0x28 Synergy XD DES), LCX large, 40ost/p, RCA large, 136m CTA (unable to wire). Dist RCA filla via R->R collats from RV branch.   Carpal tunnel syndrome    Chronic combined systolic (congestive) and diastolic (congestive) heart failure (HCC)    a. 07/2021 Echo: EF 44%, sev asymm LVH, Gr1 DD, sev basal-mid inf AK. Nl RV size/fxn. Mild MR.   COPD (chronic obstructive pulmonary disease) (HCC)    Depression    Dyspnea    a. Adm 12/2013 for dyspnea/palps: normal stress-pics-only Lexiscan nuc, echo unremarkable with normal EF, d-dimer normal, ruled out for MI.   Elevated lipids    HLD (hyperlipidemia)     Hyperglycemia    Hypertension    Ischemic cardiomyopathy    a. 07/2021 Echo: EF 44%.   Mild mitral regurgitation    Morbid obesity (HCC)    Panic attacks    Pre-diabetes    Premature atrial contractions    Sleep apnea    uses CPAP    Past Surgical History:  Procedure Laterality Date   CESAREAN SECTION     CHOLECYSTECTOMY N/A 08/01/2017   Procedure: LAPAROSCOPIC CHOLECYSTECTOMY;  Surgeon: Manus Rudd, MD;  Location: [facility];  Service: General;  Laterality: N/A;   COLONOSCOPY WITH PROPOFOL N/A 11/21/2020   Procedure: COLONOSCOPY WITH PROPOFOL;  Surgeon: Corbin Ade, MD;  Location: [facility];  Service: Endoscopy;  Laterality: N/A;  12:00pm   CORONARY STENT INTERVENTION N/A 07/31/2021   Procedure: CORONARY STENT INTERVENTION;  Surgeon: Kathleene Hazel, MD;  Location: [facility] INVASIVE CV LAB;  Service: Cardiovascular;  Laterality: N/A;   LEFT HEART CATH AND CORONARY ANGIOGRAPHY N/A 07/31/2021   Procedure: LEFT HEART CATH AND CORONARY ANGIOGRAPHY;  Surgeon: Kathleene Hazel, MD;  Location: [facility] INVASIVE CV LAB;  Service: Cardiovascular;  Laterality: N/A;   TUBAL LIGATION      Current Medications: Current Meds  Medication Sig   albuterol (PROVENTIL) (2.5 MG/3ML) 0.083% nebulizer solution Take 2.5 mg by nebulization every 6 (six) hours as needed for wheezing or shortness of breath.    ALPRAZolam Prudy Feeler)  1 MG tablet Take 1 tablet (1 mg total) by mouth 2 (two) times daily. (Patient taking differently: Take 1 mg by mouth 2 (two) times daily as needed for anxiety.)   aspirin 81 MG EC tablet Take 1 tablet (81 mg total) by mouth daily. Swallow whole.   budesonide-formoterol (SYMBICORT) 160-4.5 MCG/ACT inhaler Inhale 2 puffs into the lungs 2 (two) times daily.   cholecalciferol (VITAMIN D3) 25 MCG (1000 UNIT) tablet Take 1,000 Units by mouth daily.   dicyclomine (BENTYL) 20 MG tablet Take 1 tablet (20 mg total) by mouth 2 (two) times daily.   furosemide (LASIX) 40 MG tablet Take 40  mg by mouth daily as needed for fluid or edema.    HYDROcodone-acetaminophen (NORCO) 7.5-325 MG tablet Take 1 tablet by mouth 3 (three) times daily as needed for severe pain.   HYDROcodone-acetaminophen (NORCO/VICODIN) 5-325 MG tablet Take 1 tablet by mouth every 4 (four) hours as needed.   KLOR-CON M20 20 MEQ tablet Take 20 mEq by mouth daily as needed (when taking furosemide).   metoprolol tartrate (LOPRESSOR) 25 MG tablet Take 1 tablet (25 mg total) by mouth 2 (two) times daily.   Multiple Vitamin (MULTIVITAMIN WITH MINERALS) TABS tablet Take 1 tablet by mouth daily.   Polyethyl Glycol-Propyl Glycol (SYSTANE) 0.4-0.3 % SOLN Apply 1 drop to eye daily as needed (dry eyes).   PROAIR HFA 108 (90 BASE) MCG/ACT inhaler Inhale 1-2 puffs into the lungs every 6 (six) hours as needed for wheezing or shortness of breath.   rosuvastatin (CRESTOR) 40 MG tablet Take 1 tablet (40 mg total) by mouth daily. (Patient taking differently: Take 40 mg by mouth at bedtime.)   ticagrelor (BRILINTA) 90 MG TABS tablet Take 1 tablet (90 mg total) by mouth 2 (two) times daily.   [DISCONTINUED] empagliflozin (JARDIANCE) 10 MG TABS tablet Take 1 tablet (10 mg total) by mouth daily.   [DISCONTINUED] nitroGLYCERIN (NITROSTAT) 0.4 MG SL tablet Place 1 tablet (0.4 mg total) under the tongue every 5 (five) minutes x 3 doses as needed for chest pain.   [DISCONTINUED] sacubitril-valsartan (ENTRESTO) 24-26 MG Take 1 tablet by mouth 2 (two) times daily.     Allergies:   Patient has no known allergies.   Social History   Socioeconomic History   Marital status: Legally Separated    Spouse name: Not on file   Number of children: Not on file   Years of education: Not on file   Highest education level: Not on file  Occupational History   Not on file  Tobacco Use   Smoking status: Never   Smokeless tobacco: Never  Vaping Use   Vaping Use: Never used  Substance and Sexual Activity   Alcohol use: No   Drug use: No   Sexual  activity: Not Currently    Birth control/protection: Surgical  Other Topics Concern   Not on file  Social History Narrative   Not on file   Social Determinants of Health   Financial Resource Strain: Not on file  Food Insecurity: Not on file  Transportation Needs: Not on file  Physical Activity: Not on file  Stress: Not on file  Social Connections: Not on file     Family History:  The patient's  family history includes Alcohol abuse in her brother and father; Coronary artery disease in her sister; Coronary artery disease (age of onset: 64) in her sister; Depression in her mother and sister; Heart attack (age of onset: 81) in her sister; Schizophrenia in  her brother.   ROS:   Please see the history of present illness.    ROS All other systems reviewed and are negative.   PHYSICAL EXAM:   VS:  BP 134/82   Pulse 88   Ht 5\' 7"  (1.702 m)   Wt 265 lb (120.2 kg)   LMP 07/11/2017 (Exact Date)   SpO2 97%   BMI 41.50 kg/m   Physical Exam  GEN: Obese, in no acute distress  Neck: no JVD, carotid bruits, or masses Cardiac:RRR; no murmurs, rubs, or gallops  Respiratory:  clear to auscultation bilaterally, normal work of breathing GI: soft, nontender, nondistended, + BS Ext: without cyanosis, clubbing, or edema, Good distal pulses bilaterally Neuro:  Alert and Oriented x 3, Psych: euthymic mood, full affect  Wt Readings from Last 3 Encounters:  11/13/21 265 lb (120.2 kg)  11/08/21 261 lb 3.9 oz (118.5 kg)  10/26/21 260 lb (117.9 kg)      Studies/Labs Reviewed:   EKG:  EKG is not ordered today.    Recent Labs: 08/21/2021: TSH 0.408 09/21/2021: BUN 16; Creatinine, Ser 1.11; Hemoglobin 13.4; Platelets 261; Potassium 3.5; Sodium 137 09/22/2021: ALT 14   Lipid Panel    Component Value Date/Time   CHOL 92 09/22/2021 1244   TRIG 104 09/22/2021 1244   HDL 29 (L) 09/22/2021 1244   CHOLHDL 3.2 09/22/2021 1244   VLDL 21 09/22/2021 1244   LDLCALC 42 09/22/2021 1244     Additional studies/ records that were reviewed today include:   LHC 07/31/21   Mid RCA lesion is 100% stenosed.   Ost Cx to Prox Cx lesion is 40% stenosed.   Mid LAD lesion is 99% stenosed.   Prox RCA lesion is 99% stenosed.   A drug-eluting stent was successfully placed using a SYNERGY XD 3.0X28.   Post intervention, there is a 0% residual stenosis.   The LAD is a large caliber vessel that courses to the apex. The mid LAD is sub-totally occluded.  The Circumflex is a large caliber vessel with moderate ostial stenosis The RCA is a large dominant artery with chronic occlusion of the mid vessel. The distal vessel fills from right to right bridging collaterals.  Successful PTCA/DES x 1 mid LAD Unsuccessful attempt at PCI of the mid RCA CTO Elevated LVEDP   Recommendations: Continue DAPT with ASA/Brilinta for one year. Continue beta blocker and statin. Will give one dose of IV Lasix today given her elevated filling pressures. She may benefit from ongoing diuresis.    Echo 07/30/21 IMPRESSIONS     1. Left ventricular ejection fraction by 3D volume is 44 %. The left  ventricle has mildly decreased function. The left ventricle demonstrates  regional wall motion abnormalities (see scoring diagram/findings for  description). The left ventricular  internal cavity size was mildly dilated. There is severe asymmetric left  ventricular hypertrophy. Left ventricular diastolic parameters are  consistent with Grade I diastolic dysfunction (impaired relaxation). There  is severe akinesis of the left  ventricular, basal-mid inferior wall.   2. Right ventricular systolic function is normal. The right ventricular  size is normal.   3. The mitral valve is normal in structure. Mild mitral valve  regurgitation.   4. The aortic valve is grossly normal. Aortic valve regurgitation is not  visualized. No aortic stenosis is present.    Risk Assessment/Calculations:         ASSESSMENT:    1.  Coronary artery disease involving native coronary artery of native heart without  angina pectoris   2. Ischemic cardiomyopathy   3. Essential hypertension   4. Hyperlipidemia, unspecified hyperlipidemia type   5. OSA on CPAP   6. Morbid obesity (HCC)      PLAN:  In order of problems listed above:  CAD with late presenting NSTEMI 07/2021 treated with DES to the LAD, CTO of the RCA-unable to wire, ischemic cardiomyopathy EF 44% on echo 07/2021. No angina.  Patient ran out of Brilinta and metoprolol for 1 day.  Advised her to try not to do this again.  Continue cardiac rehab.  Ischemic cardiomyopathy EF 44% on echo 07/2021, and did not tolerate Entresto or Jardiance.  We will get limited echo to reassess LV function.  Blood pressure would allow [place] to start something but patient reluctant at this time.  Hypertension blood pressure controlled on metoprolol  Hyperlipidemia LDL 42-10/14/22  OSA on CPAP  Obesity weight loss recommended  Shared Decision Making/Informed Consent        Medication Adjustments/Labs and Tests Ordered: Current medicines are reviewed at length with the patient today.  Concerns regarding medicines are outlined above.  Medication changes, Labs and Tests ordered today are listed in the Patient Instructions below. Patient Instructions  Medication Instructions:  Your physician recommends that you continue on your current medications as directed. Please refer to the Current Medication list given to you today.  *If you need a refill on your cardiac medications before your next appointment, please call your pharmacy*   Lab Work: NONE   If you have labs (blood work) drawn today and your tests are completely normal, you will receive your results only by: MyChart Message (if you have MyChart) OR A paper copy in the mail If you have any lab test that is abnormal or we need to change your treatment, we will call you to review the results.   Testing/Procedures: Your  physician has requested that you have an echocardiogram. Echocardiography is a painless test that uses sound waves to create images of your heart. It provides your doctor with information about the size and shape of your heart and how well your heart's chambers and valves are working. This procedure takes approximately one hour. There are no restrictions for this procedure.    Follow-Up: At [facility], you and your health needs are our priority.  As part of our continuing mission to provide you with exceptional heart care, we have created designated Provider Care Teams.  These Care Teams include your primary Cardiologist (physician) and Advanced Practice Providers (APPs -  Physician Assistants and Nurse Practitioners) who all work together to provide you with the care you need, when you need it.  We recommend signing up for the patient portal called "MyChart".  Sign up information is provided on this After Visit Summary.  MyChart is used to connect with patients for Virtual Visits (Telemedicine).  Patients are able to view lab/test results, encounter notes, upcoming appointments, etc.  Non-urgent messages can be sent to your provider as well.   To learn more about what you can do with MyChart, go to [email or web address].    Your next appointment:    Next available   The format for your next appointment:   In Person  Provider:   Nona Dell, MD    Other Instructions Thank you for choosing [facility]!     Signed, Jacolyn Reedy, PA-C  11/13/2021 2:16 PM    [facility] [address] [place], [place], [place]  [address] Phone: [phone]; Fax: [phone]

## 2021-11-08 ENCOUNTER — Encounter (HOSPITAL_COMMUNITY): Payer: Self-pay

## 2021-11-08 ENCOUNTER — Encounter (HOSPITAL_COMMUNITY)
Admission: RE | Admit: 2021-11-08 | Discharge: 2021-11-08 | Disposition: A | Discharge status: Home / Self Care | Payer: Medicare Other | Source: Ambulatory Visit | Attending: Cardiovascular Disease | Admitting: Cardiovascular Disease

## 2021-11-08 ENCOUNTER — Other Ambulatory Visit: Payer: Self-pay

## 2021-11-08 VITALS — BP 124/70 | HR 78 | Ht 67.0 in | Wt 261.2 lb

## 2021-11-08 DIAGNOSIS — I214 Non-ST elevation (NSTEMI) myocardial infarction: Secondary | ICD-10-CM | POA: Insufficient documentation

## 2021-11-08 DIAGNOSIS — Z955 Presence of coronary angioplasty implant and graft: Secondary | ICD-10-CM | POA: Insufficient documentation

## 2021-11-08 NOTE — Progress Notes (Signed)
Cardiac Individual Treatment Plan  Patient Details  Name: Annette Bryant MRN: [id number] Date of Birth: May 18, 1961 Referring Provider:   Flowsheet Row CARDIAC REHAB PHASE II ORIENTATION from 11/08/2021 in [facility] CARDIAC REHABILITATION  Referring Provider Dr. Elease Hashimoto       Initial Encounter Date:  Flowsheet Row CARDIAC REHAB PHASE II ORIENTATION from 11/08/2021 in [place] CARDIAC REHABILITATION  Date 11/08/21       Visit Diagnosis: NSTEMI (non-ST elevated myocardial infarction) [facility]  Status post coronary artery stent placement  Patient's Home Medications on Admission:  Current Outpatient Medications:    albuterol (PROVENTIL) (2.5 MG/3ML) 0.083% nebulizer solution, Take 2.5 mg by nebulization every 6 (six) hours as needed for wheezing or shortness of breath. , Disp: , Rfl:    ALPRAZolam (XANAX) 1 MG tablet, Take 1 tablet (1 mg total) by mouth 2 (two) times daily. (Patient taking differently: Take 1 mg by mouth 2 (two) times daily as needed for anxiety.), Disp: 60 tablet, Rfl: 2   aspirin 81 MG EC tablet, Take 1 tablet (81 mg total) by mouth daily. Swallow whole., Disp: 90 tablet, Rfl: 3   budesonide-formoterol (SYMBICORT) 160-4.5 MCG/ACT inhaler, Inhale 2 puffs into the lungs 2 (two) times daily., Disp: , Rfl:    cholecalciferol (VITAMIN D3) 25 MCG (1000 UNIT) tablet, Take 1,000 Units by mouth daily., Disp: , Rfl:    furosemide (LASIX) 40 MG tablet, Take 40 mg by mouth daily as needed for fluid or edema. , Disp: , Rfl:    HYDROcodone-acetaminophen (NORCO) 7.5-325 MG tablet, Take 1 tablet by mouth 3 (three) times daily as needed for severe pain., Disp: , Rfl:    KLOR-CON M20 20 MEQ tablet, Take 20 mEq by mouth daily as needed (when taking furosemide)., Disp: , Rfl:    metoprolol tartrate (LOPRESSOR) 25 MG tablet, Take 1 tablet (25 mg total) by mouth 2 (two) times daily., Disp: 60 tablet, Rfl: 3   Multiple Vitamin (MULTIVITAMIN WITH MINERALS) TABS tablet, Take 1 tablet by mouth  daily., Disp: , Rfl:    nitroGLYCERIN (NITROSTAT) 0.4 MG SL tablet, Place 1 tablet (0.4 mg total) under the tongue every 5 (five) minutes x 3 doses as needed for chest pain., Disp: 25 tablet, Rfl: 3   Polyethyl Glycol-Propyl Glycol (SYSTANE) 0.4-0.3 % SOLN, Apply 1 drop to eye daily as needed (dry eyes)., Disp: , Rfl:    PROAIR HFA 108 (90 BASE) MCG/ACT inhaler, Inhale 1-2 puffs into the lungs every 6 (six) hours as needed for wheezing or shortness of breath., Disp: , Rfl:    rosuvastatin (CRESTOR) 40 MG tablet, Take 1 tablet (40 mg total) by mouth daily. (Patient taking differently: Take 40 mg by mouth at bedtime.), Disp: 90 tablet, Rfl: 3   ticagrelor (BRILINTA) 90 MG TABS tablet, Take 1 tablet (90 mg total) by mouth 2 (two) times daily., Disp: 180 tablet, Rfl: 3   dicyclomine (BENTYL) 20 MG tablet, Take 1 tablet (20 mg total) by mouth 2 (two) times daily. (Patient not taking: Reported on 11/06/2021), Disp: 20 tablet, Rfl: 0   empagliflozin (JARDIANCE) 10 MG TABS tablet, Take 1 tablet (10 mg total) by mouth daily. (Patient not taking: Reported on 11/06/2021), Disp: 30 tablet, Rfl: 6   HYDROcodone-acetaminophen (NORCO/VICODIN) 5-325 MG tablet, Take 1 tablet by mouth every 4 (four) hours as needed. (Patient not taking: Reported on 11/06/2021), Disp: 10 tablet, Rfl: 0   sacubitril-valsartan (ENTRESTO) 24-26 MG, Take 1 tablet by mouth 2 (two) times daily. (Patient not taking:  Reported on 11/06/2021), Disp: 60 tablet, Rfl: 6  Past Medical History: Past Medical History:  Diagnosis Date   Anxiety    Arthritis    Asthma    CAD (coronary artery disease)    a. 07/2021 NSTEMI/PCI: LM nl, LAD 2m (3.0x28 Synergy XD DES), LCX large, 40ost/p, RCA large, 181m CTA (unable to wire). Dist RCA filla via R->R collats from RV branch.   Carpal tunnel syndrome    Chronic combined systolic (congestive) and diastolic (congestive) heart failure (HCC)    a. 07/2021 Echo: EF 44%, sev asymm LVH, Gr1 DD, sev basal-mid inf  AK. Nl RV size/fxn. Mild MR.   COPD (chronic obstructive pulmonary disease) (HCC)    Depression    Dyspnea    a. Adm 12/2013 for dyspnea/palps: normal stress-pics-only Lexiscan nuc, echo unremarkable with normal EF, d-dimer normal, ruled out for MI.   Elevated lipids    HLD (hyperlipidemia)    Hyperglycemia    Hypertension    Ischemic cardiomyopathy    a. 07/2021 Echo: EF 44%.   Mild mitral regurgitation    Morbid obesity (HCC)    Panic attacks    Pre-diabetes    Premature atrial contractions    Sleep apnea    uses CPAP    Tobacco Use: Social History   Tobacco Use  Smoking Status Never  Smokeless Tobacco Never    Labs: Recent Review Flowsheet Data     Labs for ITP Cardiac and Pulmonary Rehab Latest Ref Rng & Units 12/11/2013 07/29/2021 07/30/2021 09/22/2021   Cholestrol 0 - 200 mg/dL 540(J) - 811 92   LDLCALC 0 - 99 mg/dL 914(N) - 829(F) 42   HDL >40 mg/dL 62(Z) - 30(Q) 65(H)   Trlycerides <150 mg/dL 846 - 962 952   Hemoglobin A1c 4.8 - 5.6 % 6.0(H) 6.4(H) - -       Capillary Blood Glucose: Lab Results  Component Value Date   GLUCAP 107 (H) 02/27/2016     Exercise Target Goals: Exercise Program Goal: Individual exercise prescription set using results from initial 6 min walk test and THRR while considering  patient's activity barriers and safety.   Exercise Prescription Goal: Starting with aerobic activity 30 plus minutes a day, 3 days per week for initial exercise prescription. Provide home exercise prescription and guidelines that participant acknowledges understanding prior to discharge.  Activity Barriers & Risk Stratification:  Activity Barriers & Cardiac Risk Stratification - 11/08/21 1311       Activity Barriers & Cardiac Risk Stratification   Activity Barriers Arthritis;Deconditioning    Cardiac Risk Stratification High             6 Minute Walk:  6 Minute Walk     Row Name 11/08/21 1411         6 Minute Walk   Phase Initial     Distance  1100 feet     Walk Time 6 minutes     # of Rest Breaks 0     MPH 2.1     METS 2.54     RPE 13     VO2 Peak 8.92     Symptoms No     Resting HR 78 bpm     Resting BP 124/70     Resting Oxygen Saturation  93 %     Exercise Oxygen Saturation  during 6 min walk 97 %     Max Ex. HR 105 bpm     Max Ex. BP 140/70  2 Minute Post BP 120/68              Oxygen Initial Assessment:   Oxygen Re-Evaluation:   Oxygen Discharge (Final Oxygen Re-Evaluation):   Initial Exercise Prescription:  Initial Exercise Prescription - 11/08/21 1400       Date of Initial Exercise RX and Referring Provider   Date 11/08/21    Referring Provider Dr. Elease Hashimoto    Expected Discharge Date 02/02/22      NuStep   Level 2    SPM 70    Minutes 22      Arm Ergometer   Level 1    RPM 60    Minutes 17      Prescription Details   Frequency (times per week) 3    Duration Progress to 30 minutes of continuous aerobic without signs/symptoms of physical distress      Intensity   THRR 40-80% of Max Heartrate 65-130    Ratings of Perceived Exertion 11-13    Perceived Dyspnea 0-4      Resistance Training   Training Prescription Yes    Weight 3    Reps 10-15             Perform Capillary Blood Glucose checks as needed.  Exercise Prescription Changes:   Exercise Comments:   Exercise Goals and Review:   Exercise Goals     Row Name 11/08/21 1414             Exercise Goals   Increase Physical Activity Yes       Intervention Provide advice, education, support and counseling about physical activity/exercise needs.;Develop an individualized exercise prescription for aerobic and resistive training based on initial evaluation findings, risk stratification, comorbidities and participant's personal goals.       Expected Outcomes Short Term: Attend rehab on a regular basis to increase amount of physical activity.;Long Term: Add in home exercise to make exercise part of routine and to  increase amount of physical activity.;Long Term: Exercising regularly at least 3-5 days a week.       Increase Strength and Stamina Yes       Intervention Provide advice, education, support and counseling about physical activity/exercise needs.;Develop an individualized exercise prescription for aerobic and resistive training based on initial evaluation findings, risk stratification, comorbidities and participant's personal goals.       Expected Outcomes Short Term: Increase workloads from initial exercise prescription for resistance, speed, and METs.;Short Term: Perform resistance training exercises routinely during rehab and add in resistance training at home;Long Term: Improve cardiorespiratory fitness, muscular endurance and strength as measured by increased METs and functional capacity ( )       Able to understand and use rate of perceived exertion (RPE) scale Yes       Intervention Provide education and explanation on how to use RPE scale       Expected Outcomes Short Term: Able to use RPE daily in rehab to express subjective intensity level;Long Term:  Able to use RPE to guide intensity level when exercising independently       Knowledge and understanding of Target Heart Rate Range (THRR) Yes       Intervention Provide education and explanation of THRR including how the numbers were predicted and where they are located for reference       Expected Outcomes Short Term: Able to use daily as guideline for intensity in rehab;Long Term: Able to use THRR to govern intensity when exercising independently;Short Term: Able to state/look up  THRR       Able to check pulse independently Yes       Intervention Provide education and demonstration on how to check pulse in carotid and radial arteries.;Review the importance of being able to check your own pulse for safety during independent exercise       Expected Outcomes Short Term: Able to explain why pulse checking is important during independent  exercise;Long Term: Able to check pulse independently and accurately       Understanding of Exercise Prescription Yes       Intervention Provide education, explanation, and written materials on patient's individual exercise prescription       Expected Outcomes Long Term: Able to explain home exercise prescription to exercise independently;Short Term: Able to explain program exercise prescription                Exercise Goals Re-Evaluation :    Discharge Exercise Prescription (Final Exercise Prescription Changes):   Nutrition:  Target Goals: Understanding of nutrition guidelines, daily intake of sodium 1500mg , cholesterol 200mg , calories 30% from fat and 7% or less from saturated fats, daily to have 5 or more servings of fruits and vegetables.  Biometrics:  Pre Biometrics - 11/08/21 1415       Pre Biometrics   Height 5\' 7"  (1.702 m)    Weight 261 lb 3.9 oz (118.5 kg)    Waist Circumference 48 inches    Hip Circumference 44 inches    Waist to Hip Ratio 1.09 %    BMI (Calculated) 40.91    Triceps Skinfold 29 mm    % Body Fat 49.5 %    Flexibility 6.5 in    Single Leg Stand 11 seconds              Nutrition Therapy Plan and Nutrition Goals:   Nutrition Assessments:  Nutrition Assessments - 11/08/21 1316       MEDFICTS Scores   Pre Score 6            MEDIFICTS Score Key: ?70 Need to make dietary changes  40-70 Heart Healthy Diet ? 40 Therapeutic Level Cholesterol Diet   Picture Your Plate Scores: 11/10/21 Unhealthy dietary pattern with much room for improvement. 41-50 Dietary pattern unlikely to meet recommendations for good health and room for improvement. 51-60 More healthful dietary pattern, with some room for improvement.  >60 Healthy dietary pattern, although there may be some specific behaviors that could be improved.    Nutrition Goals Re-Evaluation:   Nutrition Goals Discharge (Final Nutrition Goals Re-Evaluation):   Psychosocial: Target  Goals: Acknowledge presence or absence of significant depression and/or stress, maximize coping skills, provide positive support system. Participant is able to verbalize types and ability to use techniques and skills needed for reducing stress and depression.  Initial Review & Psychosocial Screening:  Initial Psych Review & Screening - 11/08/21 1314       Initial Review   Current issues with Current Depression;Current Anxiety/Panic   on xanax prn     Family Dynamics   Good Support System? Yes    Comments Her four children are her main support system. She also has several friends through church.      Barriers   Psychosocial barriers to participate in program The patient should benefit from training in stress management and relaxation.      Screening Interventions   Interventions Encouraged to exercise;Provide feedback about the scores to participant    Expected Outcomes Long Term goal: The participant improves quality  of Life and PHQ9 Scores as seen by post scores and/or verbalization of changes;Short Term goal: Identification and review with participant of any Quality of Life or Depression concerns found by scoring the questionnaire.             Quality of Life Scores:  Quality of Life - 11/08/21 1416       Quality of Life   Select Quality of Life      Quality of Life Scores   Health/Function Pre 23.14 %    Socioeconomic Pre 23.4 %    Psych/Spiritual Pre 24.86 %    Family Pre 28.5 %    GLOBAL Pre 24.3 %            Scores of 19 and below usually indicate a poorer quality of life in these areas.  A difference of  2-3 points is a clinically meaningful difference.  A difference of 2-3 points in the total score of the Quality of Life Index has been associated with significant improvement in overall quality of life, self-image, physical symptoms, and general health in studies assessing change in quality of life.  PHQ-9: Recent Review Flowsheet Data     Depression screen  [facility] 2/9 11/08/2021   Decreased Interest 1   Down, Depressed, Hopeless 0   PHQ - 2 Score 1   Altered sleeping 1   Tired, decreased energy 1   Change in appetite 0   Feeling bad or failure about yourself  1   Trouble concentrating 0   Moving slowly or fidgety/restless 0   Suicidal thoughts 0   PHQ-9 Score 4   Difficult doing work/chores Not difficult at all      Interpretation of Total Score  Total Score Depression Severity:  1-4 = Minimal depression, 5-9 = Mild depression, 10-14 = Moderate depression, 15-19 = Moderately severe depression, 20-27 = Severe depression   Psychosocial Evaluation and Intervention:  Psychosocial Evaluation - 11/08/21 1332       Psychosocial Evaluation & Interventions   Interventions Stress management education;Relaxation education;Encouraged to exercise with the program and follow exercise prescription    Comments Pt has no barriers to completing rehab. She does have anxiety and depression which is treated with xanax prn. 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 This has caused her a lot of sadness and she brought it up several times during the orientation. She feels betrayed by him and has still not gotten over it. She reports that she is coping well on her own and declined a therapist referral. She has a good support system with her four children and her church friends. She is currently attending night school to get her associates degree in divinity. She reports that she does have problems controlling her eating. She states that she often overeats to cope with her emotions and this has caused her to gain a significant amount of weight over the years. Her goals while in the program are to lose about 20 lbs, decrease her SOB with exertion, and to increase her energy levels. She is nervous about starting the program, but she is hopeful that she will benefit from it.    Expected Outcomes Pt's depression  and anxiety will continue to be managed with xanax and pt will have no other identifiable psychosocial issues.    Continue Psychosocial Services  No Follow up required             Psychosocial  Re-Evaluation:   Psychosocial Discharge (Final Psychosocial Re-Evaluation):   Vocational Rehabilitation: Provide vocational rehab assistance to qualifying candidates.   Vocational Rehab Evaluation & Intervention:  Vocational Rehab - 11/08/21 1321       Initial Vocational Rehab Evaluation & Intervention   Assessment shows need for Vocational Rehabilitation No             Education: Education Goals: Education classes will be provided on a weekly basis, covering required topics. Participant will state understanding/return demonstration of topics presented.  Learning Barriers/Preferences:  Learning Barriers/Preferences - 11/08/21 1317       Learning Barriers/Preferences   Learning Barriers None    Learning Preferences Written Material             Education Topics: Hypertension, Hypertension Reduction -Define heart disease and high blood pressure. Discus how high blood pressure affects the body and ways to reduce high blood pressure.   Exercise and Your Heart -Discuss why it is important to exercise, the FITT principles of exercise, normal and abnormal responses to exercise, and how to exercise safely.   Angina -Discuss definition of angina, causes of angina, treatment of angina, and how to decrease risk of having angina.   Cardiac Medications -Review what the following cardiac medications are used for, how they affect the body, and side effects that may occur when taking the medications.  Medications include Aspirin, Beta blockers, calcium channel blockers, ACE Inhibitors, angiotensin receptor blockers, diuretics, digoxin, and antihyperlipidemics.   Congestive Heart Failure -Discuss the definition of CHF, how to live with CHF, the signs and symptoms of CHF, and how  keep track of weight and sodium intake.   Heart Disease and Intimacy -Discus the effect sexual activity has on the heart, how changes occur during intimacy as we age, and safety during sexual activity.   Smoking Cessation / COPD -Discuss different methods to quit smoking, the health benefits of quitting smoking, and the definition of COPD.   Nutrition I: Fats -Discuss the types of cholesterol, what cholesterol does to the heart, and how cholesterol levels can be controlled.   Nutrition II: Labels -Discuss the different components of food labels and how to read food label   Heart Parts/Heart Disease and PAD -Discuss the anatomy of the heart, the pathway of blood circulation through the heart, and these are affected by heart disease.   Stress I: Signs and Symptoms -Discuss the causes of stress, how stress may lead to anxiety and depression, and ways to limit stress.   Stress II: Relaxation -Discuss different types of relaxation techniques to limit stress.   Warning Signs of Stroke / TIA -Discuss definition of a stroke, what the signs and symptoms are of a stroke, and how to identify when someone is having stroke.   Knowledge Questionnaire Score:  Knowledge Questionnaire Score - 11/08/21 1317       Knowledge Questionnaire Score   Pre Score 18/24             Core Components/Risk Factors/Patient Goals at Admission:  Personal Goals and Risk Factors at Admission - 11/08/21 1321       Core Components/Risk Factors/Patient Goals on Admission    Weight Management Yes;Obesity;Weight Loss    Intervention Weight Management: Develop a combined nutrition and exercise program designed to reach desired caloric intake, while maintaining appropriate intake of nutrient and fiber, sodium and fats, and appropriate energy expenditure required for the weight goal.;Weight Management: Provide education and appropriate resources to help participant work on and attain dietary  goals.;Weight  Management/Obesity: Establish reasonable short term and long term weight goals.;Obesity: Provide education and appropriate resources to help participant work on and attain dietary goals.    Admit Weight 260 lb (117.9 kg)    Goal Weight: Short Term 240 lb (108.9 kg)    Expected Outcomes Short Term: Continue to assess and modify interventions until short term weight is achieved;Long Term: Adherence to nutrition and physical activity/exercise program aimed toward attainment of established weight goal;Weight Loss: Understanding of general recommendations for a balanced deficit meal plan, which promotes 1-2 lb weight loss per week and includes a negative energy balance of [phone] kcal/d;Understanding recommendations for meals to include 15-35% energy as protein, 25-35% energy from fat, 35-60% energy from carbohydrates, less than 200mg  of dietary cholesterol, 20-35 gm of total fiber daily;Understanding of distribution of calorie intake throughout the day with the consumption of 4-5 meals/snacks;Weight Maintenance: Understanding of the daily nutrition guidelines, which includes 25-35% calories from fat, 7% or less cal from saturated fats, less than 200mg  cholesterol, less than 1.5gm of sodium, & 5 or more servings of fruits and vegetables daily    Improve shortness of breath with ADL's Yes    Intervention Provide education, individualized exercise plan and daily activity instruction to help decrease symptoms of SOB with activities of daily living.    Expected Outcomes Short Term: Improve cardiorespiratory fitness to achieve a reduction of symptoms when performing ADLs;Long Term: Be able to perform more ADLs without symptoms or delay the onset of symptoms    Personal Goal Other Yes    Personal Goal Have more energy    Intervention Participate in cardiac rehab and home exercise    Expected Outcomes She will gain more energy and stamina.             Core Components/Risk Factors/Patient Goals Review:     Core Components/Risk Factors/Patient Goals at Discharge (Final Review):    ITP Comments:   Comments: Patient arrived for 1st visit/orientation/education at 1230. Patient was referred to CR by Dr. due to NSTEMI (I21.4) and status post coronary artery stent placement (Z95.5). During orientation advised patient on arrival and appointment times what to wear, what to do before, during and after exercise. Reviewed attendance and class policy.  Pt is scheduled to return Cardiac Rehab on 11/13/2021 at 1100. Pt was advised to come to class 15 minutes before class starts.  Discussed RPE/Dpysnea scales. Patient participated in warm up stretches. Patient was able to complete 6 minute walk test.  Telemetry: NSR. Patient was measured for the equipment. Discussed equipment safety with patient. Took patient pre-anthropometric measurements. Patient finished visit at 1355.

## 2021-11-09 ENCOUNTER — Ambulatory Visit: Payer: Medicare Other | Admitting: Orthopedic Surgery

## 2021-11-13 ENCOUNTER — Encounter (HOSPITAL_COMMUNITY)
Admission: RE | Admit: 2021-11-13 | Discharge: 2021-11-13 | Disposition: A | Discharge status: Home / Self Care | Payer: Medicare Other | Source: Ambulatory Visit | Attending: Cardiovascular Disease | Admitting: Cardiovascular Disease

## 2021-11-13 ENCOUNTER — Ambulatory Visit (INDEPENDENT_AMBULATORY_CARE_PROVIDER_SITE_OTHER): Payer: Medicare Other | Admitting: Physician Assistant

## 2021-11-13 ENCOUNTER — Other Ambulatory Visit: Payer: Self-pay

## 2021-11-13 ENCOUNTER — Encounter: Payer: Self-pay | Admitting: Physician Assistant

## 2021-11-13 VITALS — BP 134/82 | HR 88 | Ht 67.0 in | Wt 265.0 lb

## 2021-11-13 VITALS — Wt 265.9 lb

## 2021-11-13 DIAGNOSIS — I10 Essential (primary) hypertension: Secondary | ICD-10-CM | POA: Diagnosis not present

## 2021-11-13 DIAGNOSIS — G4733 Obstructive sleep apnea (adult) (pediatric): Secondary | ICD-10-CM

## 2021-11-13 DIAGNOSIS — E785 Hyperlipidemia, unspecified: Secondary | ICD-10-CM | POA: Diagnosis not present

## 2021-11-13 DIAGNOSIS — Z955 Presence of coronary angioplasty implant and graft: Secondary | ICD-10-CM | POA: Diagnosis present

## 2021-11-13 DIAGNOSIS — I2510 Atherosclerotic heart disease of native coronary artery without angina pectoris: Secondary | ICD-10-CM

## 2021-11-13 DIAGNOSIS — I214 Non-ST elevation (NSTEMI) myocardial infarction: Secondary | ICD-10-CM | POA: Insufficient documentation

## 2021-11-13 DIAGNOSIS — I255 Ischemic cardiomyopathy: Secondary | ICD-10-CM | POA: Diagnosis not present

## 2021-11-13 DIAGNOSIS — Z9989 Dependence on other enabling machines and devices: Secondary | ICD-10-CM

## 2021-11-13 MED ORDER — NITROGLYCERIN 0.4 MG SL SUBL: 0.4000 mg | SUBLINGUAL_TABLET | SUBLINGUAL | 3 refills | Status: DC | PRN

## 2021-11-13 NOTE — Progress Notes (Signed)
Daily Session Note  Patient Details  Name: Annette Bryant MRN: [id number] Date of Birth: Nov 06, 1961 Referring Provider:   Flowsheet Row CARDIAC REHAB PHASE II ORIENTATION from 11/08/2021 in [facility]  Referring Provider Dr. Acie Fredrickson       Encounter Date: 11/13/2021  Check In:  Session Check In - 11/13/21 1100       Check-In   Supervising physician immediately available to respond to emergencies [facility] MD immediately available    Physician(s) Dr. Harl Bowie    Location [facility]    Staff Present Hoy Register, MS, ACSM-CEP, Exercise Physiologist;Leory Allinson Zigmund Daniel, Exercise Physiologist;Debra Wynetta Emery, RN, BSN    Virtual Visit No    Medication changes reported     No    Fall or balance concerns reported    No    Tobacco Cessation No Change    Warm-up and Cool-down Performed as group-led instruction    Resistance Training Performed Yes    VAD Patient? No    PAD/SET Patient? No      Pain Assessment   Currently in Pain? No/denies    Pain Score 0-No pain    Multiple Pain Sites No              Capillary Blood Glucose: No results found for this or any previous visit (from the past 24 hour(s)).    Social History   Tobacco Use  Smoking Status Never  Smokeless Tobacco Never    Goals Met:  Independence with exercise equipment Exercise tolerated well No report of concerns or symptoms today Strength training completed today    Goals Unmet:  Not Applicable  Comments: check out 1200    Dr. Kathie Dike is Medical Director for [facility].

## 2021-11-13 NOTE — Patient Instructions (Signed)
Medication Instructions:  Your physician recommends that you continue on your current medications as directed. Please refer to the Current Medication list given to you today.  *If you need a refill on your cardiac medications before your next appointment, please call your pharmacy*   Lab Work: NONE   If you have labs (blood work) drawn today and your tests are completely normal, you will receive your results only by: MyChart Message (if you have MyChart) OR A paper copy in the mail If you have any lab test that is abnormal or we need to change your treatment, we will call you to review the results.   Testing/Procedures: Your physician has requested that you have an echocardiogram. Echocardiography is a painless test that uses sound waves to create images of your heart. It provides your doctor with information about the size and shape of your heart and how well your heart's chambers and valves are working. This procedure takes approximately one hour. There are no restrictions for this procedure.    Follow-Up: At [facility], you and your health needs are our priority.  As part of our continuing mission to provide you with exceptional heart care, we have created designated Provider Care Teams.  These Care Teams include your primary Cardiologist (physician) and Advanced Practice Providers (APPs -  Physician Assistants and Nurse Practitioners) who all work together to provide you with the care you need, when you need it.  We recommend signing up for the patient portal called "MyChart".  Sign up information is provided on this After Visit Summary.  MyChart is used to connect with patients for Virtual Visits (Telemedicine).  Patients are able to view lab/test results, encounter notes, upcoming appointments, etc.  Non-urgent messages can be sent to your provider as well.   To learn more about what you can do with MyChart, go to [email or web address].    Your next appointment:    Next  available   The format for your next appointment:   In Person  Provider:   Nona Dell, MD    Other Instructions Thank you for choosing [facility]!

## 2021-11-15 ENCOUNTER — Other Ambulatory Visit: Payer: Self-pay

## 2021-11-15 ENCOUNTER — Encounter (HOSPITAL_COMMUNITY)
Admission: RE | Admit: 2021-11-15 | Discharge: 2021-11-15 | Disposition: A | Discharge status: Home / Self Care | Payer: Medicare Other | Source: Ambulatory Visit | Attending: Cardiovascular Disease | Admitting: Cardiovascular Disease

## 2021-11-15 DIAGNOSIS — I214 Non-ST elevation (NSTEMI) myocardial infarction: Secondary | ICD-10-CM | POA: Diagnosis not present

## 2021-11-15 DIAGNOSIS — Z955 Presence of coronary angioplasty implant and graft: Secondary | ICD-10-CM

## 2021-11-15 NOTE — Progress Notes (Signed)
Cardiac Individual Treatment Plan  Patient Details  Name: Annette Bryant MRN: [id number] Date of Birth: 1961-02-18 Referring Provider:   Flowsheet Row CARDIAC REHAB PHASE II ORIENTATION from 11/08/2021 in [facility] CARDIAC REHABILITATION  Referring Provider Dr. Elease Hashimoto       Initial Encounter Date:  Flowsheet Row CARDIAC REHAB PHASE II ORIENTATION from 11/08/2021 in [place] CARDIAC REHABILITATION  Date 11/08/21       Visit Diagnosis: NSTEMI (non-ST elevated myocardial infarction) [facility])  Status post coronary artery stent placement  Patient's Home Medications on Admission:  Current Outpatient Medications:    albuterol (PROVENTIL) (2.5 MG/3ML) 0.083% nebulizer solution, Take 2.5 mg by nebulization every 6 (six) hours as needed for wheezing or shortness of breath. , Disp: , Rfl:    ALPRAZolam (XANAX) 1 MG tablet, Take 1 tablet (1 mg total) by mouth 2 (two) times daily. (Patient taking differently: Take 1 mg by mouth 2 (two) times daily as needed for anxiety.), Disp: 60 tablet, Rfl: 2   aspirin 81 MG EC tablet, Take 1 tablet (81 mg total) by mouth daily. Swallow whole., Disp: 90 tablet, Rfl: 3   budesonide-formoterol (SYMBICORT) 160-4.5 MCG/ACT inhaler, Inhale 2 puffs into the lungs 2 (two) times daily., Disp: , Rfl:    cholecalciferol (VITAMIN D3) 25 MCG (1000 UNIT) tablet, Take 1,000 Units by mouth daily., Disp: , Rfl:    dicyclomine (BENTYL) 20 MG tablet, Take 1 tablet (20 mg total) by mouth 2 (two) times daily., Disp: 20 tablet, Rfl: 0   furosemide (LASIX) 40 MG tablet, Take 40 mg by mouth daily as needed for fluid or edema. , Disp: , Rfl:    HYDROcodone-acetaminophen (NORCO) 7.5-325 MG tablet, Take 1 tablet by mouth 3 (three) times daily as needed for severe pain., Disp: , Rfl:    HYDROcodone-acetaminophen (NORCO/VICODIN) 5-325 MG tablet, Take 1 tablet by mouth every 4 (four) hours as needed., Disp: 10 tablet, Rfl: 0   KLOR-CON M20 20 MEQ tablet, Take 20 mEq by mouth daily as  needed (when taking furosemide)., Disp: , Rfl:    metoprolol tartrate (LOPRESSOR) 25 MG tablet, Take 1 tablet (25 mg total) by mouth 2 (two) times daily., Disp: 60 tablet, Rfl: 3   Multiple Vitamin (MULTIVITAMIN WITH MINERALS) TABS tablet, Take 1 tablet by mouth daily., Disp: , Rfl:    nitroGLYCERIN (NITROSTAT) 0.4 MG SL tablet, Place 1 tablet (0.4 mg total) under the tongue every 5 (five) minutes x 3 doses as needed for chest pain., Disp: 25 tablet, Rfl: 3   Polyethyl Glycol-Propyl Glycol (SYSTANE) 0.4-0.3 % SOLN, Apply 1 drop to eye daily as needed (dry eyes)., Disp: , Rfl:    PROAIR HFA 108 (90 BASE) MCG/ACT inhaler, Inhale 1-2 puffs into the lungs every 6 (six) hours as needed for wheezing or shortness of breath., Disp: , Rfl:    rosuvastatin (CRESTOR) 40 MG tablet, Take 1 tablet (40 mg total) by mouth daily. (Patient taking differently: Take 40 mg by mouth at bedtime.), Disp: 90 tablet, Rfl: 3   ticagrelor (BRILINTA) 90 MG TABS tablet, Take 1 tablet (90 mg total) by mouth 2 (two) times daily., Disp: 180 tablet, Rfl: 3  Past Medical History: Past Medical History:  Diagnosis Date   Anxiety    Arthritis    Asthma    CAD (coronary artery disease)    a. 07/2021 NSTEMI/PCI: LM nl, LAD 81m (3.0x28 Synergy XD DES), LCX large, 40ost/p, RCA large, 153m CTA (unable to wire). Dist RCA filla via R->R collats  from RV branch.   Carpal tunnel syndrome    Chronic combined systolic (congestive) and diastolic (congestive) heart failure (HCC)    a. 07/2021 Echo: EF 44%, sev asymm LVH, Gr1 DD, sev basal-mid inf AK. Nl RV size/fxn. Mild MR.   COPD (chronic obstructive pulmonary disease) (HCC)    Depression    Dyspnea    a. Adm 12/2013 for dyspnea/palps: normal stress-pics-only Lexiscan nuc, echo unremarkable with normal EF, d-dimer normal, ruled out for MI.   Elevated lipids    HLD (hyperlipidemia)    Hyperglycemia    Hypertension    Ischemic cardiomyopathy    a. 07/2021 Echo: EF 44%.   Mild mitral  regurgitation    Morbid obesity (HCC)    Panic attacks    Pre-diabetes    Premature atrial contractions    Sleep apnea    uses CPAP    Tobacco Use: Social History   Tobacco Use  Smoking Status Never  Smokeless Tobacco Never    Labs: Recent Review Flowsheet Data     Labs for ITP Cardiac and Pulmonary Rehab Latest Ref Rng & Units 12/11/2013 07/29/2021 07/30/2021 09/22/2021   Cholestrol 0 - 200 mg/dL 219(X) - 588 92   LDLCALC 0 - 99 mg/dL 325(Q) - 982(M) 42   HDL >40 mg/dL 41(R) - 83(E) 94(M)   Trlycerides <150 mg/dL 768 - 088 110   Hemoglobin A1c 4.8 - 5.6 % 6.0(H) 6.4(H) - -       Capillary Blood Glucose: Lab Results  Component Value Date   GLUCAP 107 (H) 02/27/2016     Exercise Target Goals: Exercise Program Goal: Individual exercise prescription set using results from initial 6 min walk test and THRR while considering  patient's activity barriers and safety.   Exercise Prescription Goal: Starting with aerobic activity 30 plus minutes a day, 3 days per week for initial exercise prescription. Provide home exercise prescription and guidelines that participant acknowledges understanding prior to discharge.  Activity Barriers & Risk Stratification:  Activity Barriers & Cardiac Risk Stratification - 11/08/21 1311       Activity Barriers & Cardiac Risk Stratification   Activity Barriers Arthritis;Deconditioning    Cardiac Risk Stratification High             6 Minute Walk:  6 Minute Walk     Row Name 11/08/21 1411         6 Minute Walk   Phase Initial     Distance 1100 feet     Walk Time 6 minutes     # of Rest Breaks 0     MPH 2.1     METS 2.54     RPE 13     VO2 Peak 8.92     Symptoms No     Resting HR 78 bpm     Resting BP 124/70     Resting Oxygen Saturation  93 %     Exercise Oxygen Saturation  during 6 min walk 97 %     Max Ex. HR 105 bpm     Max Ex. BP 140/70     2 Minute Post BP 120/68              Oxygen Initial  Assessment:   Oxygen Re-Evaluation:   Oxygen Discharge (Final Oxygen Re-Evaluation):   Initial Exercise Prescription:  Initial Exercise Prescription - 11/08/21 1400       Date of Initial Exercise RX and Referring Provider   Date 11/08/21    Referring  Provider Dr. Elease Hashimoto    Expected Discharge Date 02/02/22      NuStep   Level 2    SPM 70    Minutes 22      Arm Ergometer   Level 1    RPM 60    Minutes 17      Prescription Details   Frequency (times per week) 3    Duration Progress to 30 minutes of continuous aerobic without signs/symptoms of physical distress      Intensity   THRR 40-80% of Max Heartrate 65-130    Ratings of Perceived Exertion 11-13    Perceived Dyspnea 0-4      Resistance Training   Training Prescription Yes    Weight 3    Reps 10-15             Perform Capillary Blood Glucose checks as needed.  Exercise Prescription Changes:   Exercise Prescription Changes     Row Name 11/13/21 1151             Response to Exercise   Blood Pressure (Admit) 108/70       Blood Pressure (Exercise) 145/70       Blood Pressure (Exit) 115/65       Heart Rate (Admit) 79 bpm       Heart Rate (Exercise) 95 bpm       Heart Rate (Exit) 88 bpm       Rating of Perceived Exertion (Exercise) 13       Duration Continue with 30 min of aerobic exercise without signs/symptoms of physical distress.       Intensity THRR unchanged         Progression   Progression Continue to progress workloads to maintain intensity without signs/symptoms of physical distress.         Resistance Training   Training Prescription Yes       Weight 3       Reps 10-15       Time 10 Minutes         NuStep   Level 1       SPM 86       Minutes 22       METs 2.09         Arm Ergometer   Level 1       Watts 25       RPM 29       Minutes 17       METs 1.35                Exercise Comments:   Exercise Goals and Review:   Exercise Goals     Row Name 11/08/21 1414  11/13/21 1153           Exercise Goals   Increase Physical Activity Yes Yes      Intervention Provide advice, education, support and counseling about physical activity/exercise needs.;Develop an individualized exercise prescription for aerobic and resistive training based on initial evaluation findings, risk stratification, comorbidities and participant's personal goals. Provide advice, education, support and counseling about physical activity/exercise needs.;Develop an individualized exercise prescription for aerobic and resistive training based on initial evaluation findings, risk stratification, comorbidities and participant's personal goals.      Expected Outcomes Short Term: Attend rehab on a regular basis to increase amount of physical activity.;Long Term: Add in home exercise to make exercise part of routine and to increase amount of physical activity.;Long Term: Exercising regularly at least 3-5 days a  week. Short Term: Attend rehab on a regular basis to increase amount of physical activity.;Long Term: Add in home exercise to make exercise part of routine and to increase amount of physical activity.;Long Term: Exercising regularly at least 3-5 days a week.      Increase Strength and Stamina Yes Yes      Intervention Provide advice, education, support and counseling about physical activity/exercise needs.;Develop an individualized exercise prescription for aerobic and resistive training based on initial evaluation findings, risk stratification, comorbidities and participant's personal goals. Provide advice, education, support and counseling about physical activity/exercise needs.;Develop an individualized exercise prescription for aerobic and resistive training based on initial evaluation findings, risk stratification, comorbidities and participant's personal goals.      Expected Outcomes Short Term: Increase workloads from initial exercise prescription for resistance, speed, and METs.;Short Term:  Perform resistance training exercises routinely during rehab and add in resistance training at home;Long Term: Improve cardiorespiratory fitness, muscular endurance and strength as measured by increased METs and functional capacity ( ) Short Term: Increase workloads from initial exercise prescription for resistance, speed, and METs.;Short Term: Perform resistance training exercises routinely during rehab and add in resistance training at home;Long Term: Improve cardiorespiratory fitness, muscular endurance and strength as measured by increased METs and functional capacity ( )      Able to understand and use rate of perceived exertion (RPE) scale Yes Yes      Intervention Provide education and explanation on how to use RPE scale Provide education and explanation on how to use RPE scale      Expected Outcomes Short Term: Able to use RPE daily in rehab to express subjective intensity level;Long Term:  Able to use RPE to guide intensity level when exercising independently Short Term: Able to use RPE daily in rehab to express subjective intensity level;Long Term:  Able to use RPE to guide intensity level when exercising independently      Knowledge and understanding of Target Heart Rate Range (THRR) Yes Yes      Intervention Provide education and explanation of THRR including how the numbers were predicted and where they are located for reference Provide education and explanation of THRR including how the numbers were predicted and where they are located for reference      Expected Outcomes Short Term: Able to use daily as guideline for intensity in rehab;Long Term: Able to use THRR to govern intensity when exercising independently;Short Term: Able to state/look up THRR Short Term: Able to use daily as guideline for intensity in rehab;Long Term: Able to use THRR to govern intensity when exercising independently;Short Term: Able to state/look up THRR      Able to check pulse independently Yes Yes       Intervention Provide education and demonstration on how to check pulse in carotid and radial arteries.;Review the importance of being able to check your own pulse for safety during independent exercise Provide education and demonstration on how to check pulse in carotid and radial arteries.;Review the importance of being able to check your own pulse for safety during independent exercise      Expected Outcomes Short Term: Able to explain why pulse checking is important during independent exercise;Long Term: Able to check pulse independently and accurately Short Term: Able to explain why pulse checking is important during independent exercise;Long Term: Able to check pulse independently and accurately      Understanding of Exercise Prescription Yes Yes      Intervention Provide education, explanation, and written materials on patient's  individual exercise prescription Provide education, explanation, and written materials on patient's individual exercise prescription      Expected Outcomes Long Term: Able to explain home exercise prescription to exercise independently;Short Term: Able to explain program exercise prescription Long Term: Able to explain home exercise prescription to exercise independently;Short Term: Able to explain program exercise prescription               Exercise Goals Re-Evaluation :  Exercise Goals Re-Evaluation     Row Name 11/13/21 1153             Exercise Goal Re-Evaluation   Exercise Goals Review Increase Physical Activity;Increase Strength and Stamina;Able to understand and use rate of perceived exertion (RPE) scale;Knowledge and understanding of Target Heart Rate Range (THRR);Able to check pulse independently;Understanding of Exercise Prescription       Comments Pt has completed 2 sessions of cardiac rehab. I feel she could push herself harder during class but it is her first week. She is currently exercising at 2.09 METs in the stepper. Will continue to monitor and  progress as able.       Expected Outcomes Through exercise at rehab and at home, the patient will meet their stated goals.                 Discharge Exercise Prescription (Final Exercise Prescription Changes):  Exercise Prescription Changes - 11/13/21 1151       Response to Exercise   Blood Pressure (Admit) 108/70    Blood Pressure (Exercise) 145/70    Blood Pressure (Exit) 115/65    Heart Rate (Admit) 79 bpm    Heart Rate (Exercise) 95 bpm    Heart Rate (Exit) 88 bpm    Rating of Perceived Exertion (Exercise) 13    Duration Continue with 30 min of aerobic exercise without signs/symptoms of physical distress.    Intensity THRR unchanged      Progression   Progression Continue to progress workloads to maintain intensity without signs/symptoms of physical distress.      Resistance Training   Training Prescription Yes    Weight 3    Reps 10-15    Time 10 Minutes      NuStep   Level 1    SPM 86    Minutes 22    METs 2.09      Arm Ergometer   Level 1    Watts 25    RPM 29    Minutes 17    METs 1.35             Nutrition:  Target Goals: Understanding of nutrition guidelines, daily intake of sodium 1500mg , cholesterol 200mg , calories 30% from fat and 7% or less from saturated fats, daily to have 5 or more servings of fruits and vegetables.  Biometrics:  Pre Biometrics - 11/08/21 1415       Pre Biometrics   Height 5\' 7"  (1.702 m)    Weight 118.5 kg    Waist Circumference 48 inches    Hip Circumference 44 inches    Waist to Hip Ratio 1.09 %    BMI (Calculated) 40.91    Triceps Skinfold 29 mm    % Body Fat 49.5 %    Flexibility 6.5 in    Single Leg Stand 11 seconds              Nutrition Therapy Plan and Nutrition Goals:   Nutrition Assessments:  Nutrition Assessments - 11/08/21 1316       MEDFICTS  Scores   Pre Score 6            MEDIFICTS Score Key: ?70 Need to make dietary changes  40-70 Heart Healthy Diet ? 40 Therapeutic  Level Cholesterol Diet   Picture Your Plate Scores: <16 Unhealthy dietary pattern with much room for improvement. 41-50 Dietary pattern unlikely to meet recommendations for good health and room for improvement. 51-60 More healthful dietary pattern, with some room for improvement.  >60 Healthy dietary pattern, although there may be some specific behaviors that could be improved.    Nutrition Goals Re-Evaluation:   Nutrition Goals Discharge (Final Nutrition Goals Re-Evaluation):   Psychosocial: Target Goals: Acknowledge presence or absence of significant depression and/or stress, maximize coping skills, provide positive support system. Participant is able to verbalize types and ability to use techniques and skills needed for reducing stress and depression.  Initial Review & Psychosocial Screening:  Initial Psych Review & Screening - 11/08/21 1314       Initial Review   Current issues with Current Depression;Current Anxiety/Panic   on xanax prn     Family Dynamics   Good Support System? Yes    Comments Her four children are her main support system. She also has several friends through church.      Barriers   Psychosocial barriers to participate in program The patient should benefit from training in stress management and relaxation.      Screening Interventions   Interventions Encouraged to exercise;Provide feedback about the scores to participant    Expected Outcomes Long Term goal: The participant improves quality of Life and PHQ9 Scores as seen by post scores and/or verbalization of changes;Short Term goal: Identification and review with participant of any Quality of Life or Depression concerns found by scoring the questionnaire.             Quality of Life Scores:  Quality of Life - 11/08/21 1416       Quality of Life   Select Quality of Life      Quality of Life Scores   Health/Function Pre 23.14 %    Socioeconomic Pre 23.4 %    Psych/Spiritual Pre 24.86 %     Family Pre 28.5 %    GLOBAL Pre 24.3 %            Scores of 19 and below usually indicate a poorer quality of life in these areas.  A difference of  2-3 points is a clinically meaningful difference.  A difference of 2-3 points in the total score of the Quality of Life Index has been associated with significant improvement in overall quality of life, self-image, physical symptoms, and general health in studies assessing change in quality of life.  PHQ-9: Recent Review Flowsheet Data     Depression screen [facility] 2/9 11/08/2021   Decreased Interest 1   Down, Depressed, Hopeless 0   PHQ - 2 Score 1   Altered sleeping 1   Tired, decreased energy 1   Change in appetite 0   Feeling bad or failure about yourself  1   Trouble concentrating 0   Moving slowly or fidgety/restless 0   Suicidal thoughts 0   PHQ-9 Score 4   Difficult doing work/chores Not difficult at all      Interpretation of Total Score  Total Score Depression Severity:  1-4 = Minimal depression, 5-9 = Mild depression, 10-14 = Moderate depression, 15-19 = Moderately severe depression, 20-27 = Severe depression   Psychosocial Evaluation and Intervention:  Psychosocial Evaluation - 11/08/21 1332       Psychosocial Evaluation & Interventions   Interventions Stress management education;Relaxation education;Encouraged to exercise with the program and follow exercise prescription    Comments Pt has no barriers to completing rehab. She does have anxiety and depression which is treated with xanax prn. 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 This has caused her a lot of sadness and she brought it up several times during the orientation. She feels betrayed by him and has still not gotten over it. She reports that she is coping well on her own and declined a therapist referral. She has a good support system with her four children and her church friends. She is currently  attending night school to get her associates degree in divinity. She reports that she does have problems controlling her eating. She states that she often overeats to cope with her emotions and this has caused her to gain a significant amount of weight over the years. Her goals while in the program are to lose about 20 lbs, decrease her SOB with exertion, and to increase her energy levels. She is nervous about starting the program, but she is hopeful that she will benefit from it.    Expected Outcomes Pt's depression and anxiety will continue to be managed with xanax and pt will have no other identifiable psychosocial issues.    Continue Psychosocial Services  No Follow up required             Psychosocial Re-Evaluation:  Psychosocial Re-Evaluation     Row Name 11/15/21 [phone]             Psychosocial Re-Evaluation   Current issues with Current Depression;Current Anxiety/Panic       Comments Patient is new the program completeing 2 sessions. Her psychosocial issues continue to be managed with Alaprazolam. We will continue to monitor her progress.       Expected Outcomes Patient's psychosocial issues will continue to be managed.       Continue Psychosocial Services  No Follow up required                Psychosocial Discharge (Final Psychosocial Re-Evaluation):  Psychosocial Re-Evaluation - 11/15/21 0814       Psychosocial Re-Evaluation   Current issues with Current Depression;Current Anxiety/Panic    Comments Patient is new the program completeing 2 sessions. Her psychosocial issues continue to be managed with Alaprazolam. We will continue to monitor her progress.    Expected Outcomes Patient's psychosocial issues will continue to be managed.    Continue Psychosocial Services  No Follow up required             Vocational Rehabilitation: Provide vocational rehab assistance to qualifying candidates.   Vocational Rehab Evaluation & Intervention:  Vocational Rehab -  11/08/21 1321       Initial Vocational Rehab Evaluation & Intervention   Assessment shows need for Vocational Rehabilitation No             Education: Education Goals: Education classes will be provided on a weekly basis, covering required topics. Participant will state understanding/return demonstration of topics presented.  Learning Barriers/Preferences:  Learning Barriers/Preferences - 11/08/21 1317       Learning Barriers/Preferences   Learning Barriers None    Learning Preferences Written Material             Education Topics: Hypertension, Hypertension Reduction -Define heart disease  and high blood pressure. Discus how high blood pressure affects the body and ways to reduce high blood pressure.   Exercise and Your Heart -Discuss why it is important to exercise, the FITT principles of exercise, normal and abnormal responses to exercise, and how to exercise safely.   Angina -Discuss definition of angina, causes of angina, treatment of angina, and how to decrease risk of having angina.   Cardiac Medications -Review what the following cardiac medications are used for, how they affect the body, and side effects that may occur when taking the medications.  Medications include Aspirin, Beta blockers, calcium channel blockers, ACE Inhibitors, angiotensin receptor blockers, diuretics, digoxin, and antihyperlipidemics.   Congestive Heart Failure -Discuss the definition of CHF, how to live with CHF, the signs and symptoms of CHF, and how keep track of weight and sodium intake.   Heart Disease and Intimacy -Discus the effect sexual activity has on the heart, how changes occur during intimacy as we age, and safety during sexual activity.   Smoking Cessation / COPD -Discuss different methods to quit smoking, the health benefits of quitting smoking, and the definition of COPD.   Nutrition I: Fats -Discuss the types of cholesterol, what cholesterol does to the heart,  and how cholesterol levels can be controlled.   Nutrition II: Labels -Discuss the different components of food labels and how to read food label   Heart Parts/Heart Disease and PAD -Discuss the anatomy of the heart, the pathway of blood circulation through the heart, and these are affected by heart disease.   Stress I: Signs and Symptoms -Discuss the causes of stress, how stress may lead to anxiety and depression, and ways to limit stress.   Stress II: Relaxation -Discuss different types of relaxation techniques to limit stress.   Warning Signs of Stroke / TIA -Discuss definition of a stroke, what the signs and symptoms are of a stroke, and how to identify when someone is having stroke.   Knowledge Questionnaire Score:  Knowledge Questionnaire Score - 11/08/21 1317       Knowledge Questionnaire Score   Pre Score 18/24             Core Components/Risk Factors/Patient Goals at Admission:  Personal Goals and Risk Factors at Admission - 11/08/21 1321       Core Components/Risk Factors/Patient Goals on Admission    Weight Management Yes;Obesity;Weight Loss    Intervention Weight Management: Develop a combined nutrition and exercise program designed to reach desired caloric intake, while maintaining appropriate intake of nutrient and fiber, sodium and fats, and appropriate energy expenditure required for the weight goal.;Weight Management: Provide education and appropriate resources to help participant work on and attain dietary goals.;Weight Management/Obesity: Establish reasonable short term and long term weight goals.;Obesity: Provide education and appropriate resources to help participant work on and attain dietary goals.    Admit Weight 260 lb (117.9 kg)    Goal Weight: Short Term 240 lb (108.9 kg)    Expected Outcomes Short Term: Continue to assess and modify interventions until short term weight is achieved;Long Term: Adherence to nutrition and physical activity/exercise  program aimed toward attainment of established weight goal;Weight Loss: Understanding of general recommendations for a balanced deficit meal plan, which promotes 1-2 lb weight loss per week and includes a negative energy balance of [phone] kcal/d;Understanding recommendations for meals to include 15-35% energy as protein, 25-35% energy from fat, 35-60% energy from carbohydrates, less than 200mg  of dietary cholesterol, 20-35 gm of total fiber daily;Understanding  of distribution of calorie intake throughout the day with the consumption of 4-5 meals/snacks;Weight Maintenance: Understanding of the daily nutrition guidelines, which includes 25-35% calories from fat, 7% or less cal from saturated fats, less than  cholesterol, less than 1.5gm of sodium, & 5 or more servings of fruits and vegetables daily    Improve shortness of breath with ADL's Yes    Intervention Provide education, individualized exercise plan and daily activity instruction to help decrease symptoms of SOB with activities of daily living.    Expected Outcomes Short Term: Improve cardiorespiratory fitness to achieve a reduction of symptoms when performing ADLs;Long Term: Be able to perform more ADLs without symptoms or delay the onset of symptoms    Personal Goal Other Yes    Personal Goal Have more energy    Intervention Participate in cardiac rehab and home exercise    Expected Outcomes She will gain more energy and stamina.             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 She has multiple risk factors for CAD and is participating in the program for risk modification. She has completed 2 sessions. Her personal goals for the program are  to lose 20 lbs; decrease her SOB with exertion and increase her energy levels. We will continue to monitor her progress as she works towards meeting these goals.       Expected Outcomes Patient will complete the program meeting both personal and program goals.                Core Components/Risk Factors/Patient Goals at Discharge (Final Review):   Goals and Risk Factor Review - 11/15/21 0815       Core Components/Risk Factors/Patient Goals Review   Personal Goals Review Weight Management/Obesity;Improve shortness of breath with ADL's;Other    Review Patient was referred to CR with NSTEMI and DES. She has multiple risk factors for CAD and is participating in the program for risk modification. She has completed 2 sessions. Her personal goals for the program are to lose 20 lbs; decrease her SOB with exertion and increase her energy levels. We will continue to monitor her progress as she works towards meeting these goals.    Expected Outcomes Patient will complete the program meeting both personal and program goals.             ITP Comments:   Comments: ITP REVIEW Pt is making expected progress toward Cardiac Rehab goals after completing 2 sessions. Recommend continued exercise, life style modification, education, and increased stamina and strength.

## 2021-11-15 NOTE — Progress Notes (Signed)
Daily Session Note  Patient Details  Name: Annette Bryant MRN: [id number] Date of Birth: 22-Oct-1961 Referring Provider:   Flowsheet Row CARDIAC REHAB PHASE II ORIENTATION from 11/08/2021 in [place]  Referring Provider Dr. Acie Fredrickson       Encounter Date: 11/15/2021  Check In:  Session Check In - 11/15/21 1100       Check-In   Supervising physician immediately available to respond to emergencies [facility] MD immediately available    Physician(s) Dr. Harl Bowie    Location [facility]    Staff Present Hoy Register, MS, ACSM-CEP, Exercise Physiologist;Sonnie Pawloski Zigmund Daniel, Exercise Physiologist;Debra Wynetta Emery, RN, BSN    Virtual Visit No    Medication changes reported     No    Fall or balance concerns reported    No    Tobacco Cessation No Change    Warm-up and Cool-down Performed as group-led instruction    Resistance Training Performed Yes    VAD Patient? No    PAD/SET Patient? No      Pain Assessment   Currently in Pain? No/denies    Pain Score 0-No pain    Multiple Pain Sites No             Capillary Blood Glucose: No results found for this or any previous visit (from the past 24 hour(s)).    Social History   Tobacco Use  Smoking Status Never  Smokeless Tobacco Never    Goals Met:  Independence with exercise equipment Exercise tolerated well No report of concerns or symptoms today Strength training completed today  Goals Unmet:  Not Applicable  Comments: check out 1200   Dr. Kathie Dike is Medical Director for [facility].

## 2021-11-16 ENCOUNTER — Ambulatory Visit: Payer: Medicare Other | Admitting: Orthopaedic Surgery

## 2021-11-17 ENCOUNTER — Encounter (HOSPITAL_COMMUNITY)
Admission: RE | Admit: 2021-11-17 | Discharge: 2021-11-17 | Disposition: A | Discharge status: Home / Self Care | Payer: Medicare Other | Source: Ambulatory Visit | Attending: Cardiovascular Disease | Admitting: Cardiovascular Disease

## 2021-11-17 DIAGNOSIS — I214 Non-ST elevation (NSTEMI) myocardial infarction: Secondary | ICD-10-CM

## 2021-11-17 DIAGNOSIS — Z955 Presence of coronary angioplasty implant and graft: Secondary | ICD-10-CM

## 2021-11-17 NOTE — Progress Notes (Signed)
Daily Session Note  Patient Details  Name: Annette Bryant MRN: [id number] Date of Birth: 1961/02/18 Referring Provider:   Flowsheet Row CARDIAC REHAB PHASE II ORIENTATION from 11/08/2021 in [place]  Referring Provider Dr. Acie Fredrickson       Encounter Date: 11/17/2021  Check In:  Session Check In - 11/17/21 1100       Check-In   Supervising physician immediately available to respond to emergencies [facility] MD immediately available    Physician(s) Dr. Harl Bowie    Location [facility]    Staff Present Geanie Cooley, RN;Debra Wynetta Emery, RN, Bjorn Loser, MS, ACSM-CEP, Exercise Physiologist    Virtual Visit No    Medication changes reported     No    Fall or balance concerns reported    No    Tobacco Cessation No Change    Warm-up and Cool-down Performed as group-led instruction    Resistance Training Performed Yes    VAD Patient? No    PAD/SET Patient? No      Pain Assessment   Currently in Pain? No/denies    Pain Score 0-No pain    Multiple Pain Sites No             Capillary Blood Glucose: No results found for this or any previous visit (from the past 24 hour(s)).    Social History   Tobacco Use  Smoking Status Never  Smokeless Tobacco Never    Goals Met:  Independence with exercise equipment Exercise tolerated well No report of concerns or symptoms today Strength training completed today  Goals Unmet:  Not Applicable  Comments: check out @ 12:00pm   Dr. Kathie Dike is Medical Director for [facility].

## 2021-11-20 ENCOUNTER — Encounter (HOSPITAL_COMMUNITY)
Admission: RE | Admit: 2021-11-20 | Discharge: 2021-11-20 | Disposition: A | Discharge status: Home / Self Care | Payer: Medicare Other | Source: Ambulatory Visit | Attending: Cardiovascular Disease | Admitting: Cardiovascular Disease

## 2021-11-20 DIAGNOSIS — I214 Non-ST elevation (NSTEMI) myocardial infarction: Secondary | ICD-10-CM | POA: Diagnosis not present

## 2021-11-20 DIAGNOSIS — Z955 Presence of coronary angioplasty implant and graft: Secondary | ICD-10-CM

## 2021-11-20 NOTE — Progress Notes (Signed)
Daily Session Note  Patient Details  Name: TEANDRA HARLAN MRN: [id number] Date of Birth: Jun 18, 1961 Referring Provider:   Flowsheet Row CARDIAC REHAB PHASE II ORIENTATION from 11/08/2021 in [place]  Referring Provider Dr. Acie Fredrickson       Encounter Date: 11/20/2021  Check In:  Session Check In - 11/20/21 1100       Check-In   Supervising physician immediately available to respond to emergencies [facility] MD immediately available    Physician(s) Dr. Domenic Polite    Location [facility]    Staff Present Geanie Cooley, RN;Dalton Kris Mouton, MS, ACSM-CEP, Exercise Physiologist;Heather Zigmund Daniel, Exercise Physiologist;Daxton Nydam Wynetta Emery, RN, BSN    Virtual Visit No    Medication changes reported     No    Fall or balance concerns reported    No    Tobacco Cessation No Change    Warm-up and Cool-down Performed as group-led instruction    Resistance Training Performed Yes    VAD Patient? No    PAD/SET Patient? No      Pain Assessment   Currently in Pain? No/denies    Pain Score 0-No pain    Multiple Pain Sites No             Capillary Blood Glucose: No results found for this or any previous visit (from the past 24 hour(s)).    Social History   Tobacco Use  Smoking Status Never  Smokeless Tobacco Never    Goals Met:  Independence with exercise equipment Exercise tolerated well No report of concerns or symptoms today Strength training completed today  Goals Unmet:  Not Applicable  Comments: Check out 1200.   Dr. Kathie Dike is Medical Director for [facility].

## 2021-11-22 ENCOUNTER — Encounter (HOSPITAL_COMMUNITY)
Admission: RE | Admit: 2021-11-22 | Discharge: 2021-11-22 | Disposition: A | Discharge status: Home / Self Care | Payer: Medicare Other | Source: Ambulatory Visit | Attending: Cardiovascular Disease | Admitting: Cardiovascular Disease

## 2021-11-22 DIAGNOSIS — Z955 Presence of coronary angioplasty implant and graft: Secondary | ICD-10-CM

## 2021-11-22 DIAGNOSIS — I214 Non-ST elevation (NSTEMI) myocardial infarction: Secondary | ICD-10-CM

## 2021-11-22 NOTE — Progress Notes (Signed)
Daily Session Note  Patient Details  Name: Annette Bryant MRN: [id number] Date of Birth: [birth year] Referring Provider:   Flowsheet Row CARDIAC REHAB PHASE II ORIENTATION from 11/08/2021 in [place]  Referring Provider Dr. Acie Fredrickson       Encounter Date: 11/22/2021  Check In:  Session Check In - 11/22/21 1100       Check-In   Supervising physician immediately available to respond to emergencies [facility] MD immediately available    Physician(s) Dr. Johney Frame    Location [facility]    Staff Present Geanie Cooley, RN;Heather Otho Ket, BS, Exercise Physiologist;Debra Wynetta Emery, RN, BSN    Virtual Visit No    Medication changes reported     No    Fall or balance concerns reported    No    Tobacco Cessation No Change    Warm-up and Cool-down Performed as group-led instruction    Resistance Training Performed Yes    VAD Patient? No    PAD/SET Patient? No      Pain Assessment   Currently in Pain? No/denies    Pain Score 0-No pain    Multiple Pain Sites No             Capillary Blood Glucose: No results found for this or any previous visit (from the past 24 hour(s)).    Social History   Tobacco Use  Smoking Status Never  Smokeless Tobacco Never    Goals Met:  Independence with exercise equipment Exercise tolerated well No report of concerns or symptoms today Strength training completed today  Goals Unmet:  Not Applicable  Comments: check out @ 12:00pm   Dr. Kathie Dike is Medical Director for [facility] Re[facility].

## 2021-11-24 ENCOUNTER — Encounter (HOSPITAL_COMMUNITY): Payer: Medicare Other

## 2021-11-27 ENCOUNTER — Encounter (HOSPITAL_COMMUNITY)
Admission: RE | Admit: 2021-11-27 | Discharge: 2021-11-27 | Disposition: A | Discharge status: Home / Self Care | Payer: Medicare Other | Source: Ambulatory Visit | Attending: Cardiovascular Disease | Admitting: Cardiovascular Disease

## 2021-11-27 VITALS — Wt 261.9 lb

## 2021-11-27 DIAGNOSIS — Z955 Presence of coronary angioplasty implant and graft: Secondary | ICD-10-CM

## 2021-11-27 DIAGNOSIS — I214 Non-ST elevation (NSTEMI) myocardial infarction: Secondary | ICD-10-CM

## 2021-11-27 NOTE — Progress Notes (Signed)
I have reviewed a Home Exercise Prescription with Annette Bryant . [name] is not currently exercising at home.  The patient was advised to walk 5 days a week for 30-45 minutes.  [name] and I discussed how to progress their exercise prescription.  The patient stated that their goals were lose weight and increase her energy. The patient stated that they understand the exercise prescription.  We reviewed exercise guidelines, target heart rate during exercise, RPE Scale, weather conditions, NTG use, endpoints for exercise, warmup and cool down.  Patient is encouraged to come to me with any questions. I will continue to follow up with the patient to assist them with progression and safety.

## 2021-11-27 NOTE — Progress Notes (Signed)
Daily Session Note  Patient Details  Name: Annette Bryant MRN: [id number] Date of Birth: 1961-01-18 Referring Provider:   Flowsheet Row CARDIAC REHAB PHASE II ORIENTATION from 11/08/2021 in [place]  Referring Provider Dr. Acie Fredrickson       Encounter Date: 11/27/2021  Check In:  Session Check In - 11/27/21 1100       Check-In   Supervising physician immediately available to respond to emergencies [facility] MD immediately available    Physician(s) Dr. Johnsie Cancel    Location [facility]    Staff Present Aundra Dubin, RN, BSN;Heather Otho Ket, BS, Exercise Physiologist;Murat Rideout Kris Mouton, MS, ACSM-CEP, Exercise Physiologist    Virtual Visit No    Medication changes reported     No    Fall or balance concerns reported    No    Tobacco Cessation No Change    Warm-up and Cool-down Performed as group-led instruction    Resistance Training Performed Yes    VAD Patient? No    PAD/SET Patient? No      Pain Assessment   Currently in Pain? No/denies    Pain Score 0-No pain    Multiple Pain Sites No             Capillary Blood Glucose: No results found for this or any previous visit (from the past 24 hour(s)).    Social History   Tobacco Use  Smoking Status Never  Smokeless Tobacco Never    Goals Met:  Independence with exercise equipment Exercise tolerated well No report of concerns or symptoms today Strength training completed today  Goals Unmet:  Not Applicable  Comments: checkout time is 1200   Dr. Kathie Dike is Medical Director for [facility].

## 2021-11-29 ENCOUNTER — Encounter (HOSPITAL_COMMUNITY)
Admission: RE | Admit: 2021-11-29 | Discharge: 2021-11-29 | Disposition: A | Discharge status: Home / Self Care | Payer: Medicare Other | Source: Ambulatory Visit | Attending: Cardiovascular Disease | Admitting: Cardiovascular Disease

## 2021-11-29 DIAGNOSIS — I214 Non-ST elevation (NSTEMI) myocardial infarction: Secondary | ICD-10-CM

## 2021-11-29 DIAGNOSIS — Z955 Presence of coronary angioplasty implant and graft: Secondary | ICD-10-CM

## 2021-11-29 NOTE — Progress Notes (Signed)
Daily Session Note  Patient Details  Name: Annette Bryant MRN: [id number] Date of Birth: 1961-01-17 Referring Provider:   Flowsheet Row CARDIAC REHAB PHASE II ORIENTATION from 11/08/2021 in [place]  Referring Provider Dr. Acie Fredrickson       Encounter Date: 11/29/2021  Check In:  Session Check In - 11/29/21 1100       Check-In   Supervising physician immediately available to respond to emergencies [facility] MD immediately available    Physician(s) Dr. Harrington Challenger    Location [facility]    Staff Present Hoy Register, MS, ACSM-CEP, Exercise Physiologist;Glennda Weatherholtz Otho Ket, BS, Exercise Physiologist;Debra Wynetta Emery, RN, BSN    Virtual Visit No    Medication changes reported     No    Fall or balance concerns reported    No    Tobacco Cessation No Change    Warm-up and Cool-down Performed as group-led instruction    Resistance Training Performed Yes    VAD Patient? No    PAD/SET Patient? No      Pain Assessment   Currently in Pain? No/denies    Pain Score 0-No pain    Multiple Pain Sites No             Capillary Blood Glucose: No results found for this or any previous visit (from the past 24 hour(s)).    Social History   Tobacco Use  Smoking Status Never  Smokeless Tobacco Never    Goals Met:  Independence with exercise equipment Exercise tolerated well No report of concerns or symptoms today Strength training completed today  Goals Unmet:  Not Applicable  Comments: check out 1200   Dr. Kathie Dike is Medical Director for [facility] Re[facility].

## 2021-12-01 ENCOUNTER — Encounter (HOSPITAL_COMMUNITY): Payer: Medicare Other

## 2021-12-04 ENCOUNTER — Encounter (HOSPITAL_COMMUNITY): Payer: Medicare Other

## 2021-12-06 ENCOUNTER — Encounter (HOSPITAL_COMMUNITY): Payer: Medicare Other

## 2021-12-08 ENCOUNTER — Encounter (HOSPITAL_COMMUNITY): Payer: Medicare Other

## 2021-12-11 ENCOUNTER — Encounter (HOSPITAL_COMMUNITY): Payer: Medicare Other

## 2021-12-13 ENCOUNTER — Encounter (HOSPITAL_COMMUNITY): Payer: Medicare Other

## 2021-12-13 NOTE — Progress Notes (Signed)
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 with Alaprazolam. We will continue to monitor her progress. Patient has completed 7 sessions with no psychosocial barriers identified. Her depression and anxiety continue to be managed with Alprazolam. She seems to enjoy coming to the program and demonstrates an interest in improving her health. She is very interactive with the staff and others in her class. We will continue to monitor.      Expected Outcomes Patient's psychosocial issues will continue to be managed. Patient's psychosocial issues will continue to be managed.      Interventions -- Stress management education;Encouraged to attend Cardiac Rehabilitation for the exercise;Relaxation education      Continue Psychosocial Services  No Follow up required No Follow up required               Psychosocial Discharge (Final Psychosocial Re-Evaluation):  Psychosocial  Re-Evaluation - 11/30/21 0821       Psychosocial Re-Evaluation   Current issues with Current Depression;Current Anxiety/Panic    Comments Patient has completed 7 sessions with no psychosocial barriers identified. Her depression and anxiety continue to be managed with Alprazolam. She seems to enjoy coming to the program and demonstrates an interest in improving her health. She is very interactive with the staff and others in her class. We will continue to monitor.    Expected Outcomes Patient's psychosocial issues will continue to be managed.    Interventions Stress management education;Encouraged to attend Cardiac Rehabilitation for the exercise;Relaxation education    Continue Psychosocial Services  No Follow up required             Vocational Rehabilitation: Provide vocational rehab assistance to qualifying candidates.   Vocational Rehab Evaluation & Intervention:  Vocational Rehab - 11/08/21 1321       Initial Vocational Rehab Evaluation & Intervention   Assessment shows need for Vocational Rehabilitation No  Education: Education Goals: Education classes will be provided on a weekly basis, covering required topics. Participant will state understanding/return demonstration of topics presented.  Learning Barriers/Preferences:  Learning Barriers/Preferences - 11/08/21 1317       Learning Barriers/Preferences   Learning Barriers None    Learning Preferences Written Material             Education Topics: Hypertension, Hypertension Reduction -Define heart disease and high blood pressure. Discus how high blood pressure affects the body and ways to reduce high blood pressure.   Exercise and Your Heart -Discuss why it is important to exercise, the FITT principles of exercise, normal and abnormal responses to exercise, and how to exercise safely.   Angina -Discuss definition of angina, causes of angina, treatment of angina, and how to decrease risk of  having angina.   Cardiac Medications -Review what the following cardiac medications are used for, how they affect the body, and side effects that may occur when taking the medications.  Medications include Aspirin, Beta blockers, calcium channel blockers, ACE Inhibitors, angiotensin receptor blockers, diuretics, digoxin, and antihyperlipidemics.   Congestive Heart Failure -Discuss the definition of CHF, how to live with CHF, the signs and symptoms of CHF, and how keep track of weight and sodium intake.   Heart Disease and Intimacy -Discus the effect sexual activity has on the heart, how changes occur during intimacy as we age, and safety during sexual activity.   Smoking Cessation / COPD -Discuss different methods to quit smoking, the health benefits of quitting smoking, and the definition of COPD.   Nutrition I: Fats -Discuss the types of cholesterol, what cholesterol does to the heart, and how cholesterol levels can be controlled.   Nutrition II: Labels -Discuss the different components of food labels and how to read food label [place] from 11/29/2021 in [place]  Date 11/15/21  Educator [place]  Instruction Review Code 2- Demonstrated Understanding       Heart Parts/Heart Disease and PAD -Discuss the anatomy of the heart, the pathway of blood circulation through the heart, and these are affected by heart disease. Flowsheet Row CARDIAC REHAB PHASE II EXERCISE from 11/29/2021 in [place]  Date 11/22/21  Educator pb  Instruction Review Code 1- Verbalizes Understanding       Stress I: Signs and Symptoms -Discuss the causes of stress, how stress may lead to anxiety and depression, and ways to limit stress. Flowsheet Row CARDIAC REHAB PHASE II EXERCISE from 11/29/2021 in [place]  Date 11/29/21  Educator hj  Instruction Review Code 2- Demonstrated Understanding        Stress II: Relaxation -Discuss different types of relaxation techniques to limit stress.   Warning Signs of Stroke / TIA -Discuss definition of a stroke, what the signs and symptoms are of a stroke, and how to identify when someone is having stroke.   Knowledge Questionnaire Score:  Knowledge Questionnaire Score - 11/08/21 1317       Knowledge Questionnaire Score   Pre Score 18/24             Core Components/Risk Factors/Patient Goals at Admission:  Personal Goals and Risk Factors at Admission - 11/08/21 1321       Core Components/Risk Factors/Patient Goals on Admission    Weight Management Yes;Obesity;Weight Loss    Intervention Weight Management: Develop a combined nutrition and exercise program designed to reach desired caloric intake, while maintaining appropriate intake of  nutrient and fiber, sodium and fats, and appropriate energy expenditure required for the weight goal.;Weight Management: Provide education and appropriate resources to help participant work on and attain dietary goals.;Weight Management/Obesity: Establish reasonable short term and long term weight goals.;Obesity: Provide education and appropriate resources to help participant work on and attain dietary goals.    Admit Weight 260 lb (117.9 kg)    Goal Weight: Short Term 240 lb (108.9 kg)    Expected Outcomes Short Term: Continue to assess and modify interventions until short term weight is achieved;Long Term: Adherence to nutrition and physical activity/exercise program aimed toward attainment of established weight goal;Weight Loss: Understanding of general recommendations for a balanced deficit meal plan, which promotes 1-2 lb weight loss per week and includes a negative energy balance of [id number] kcal/d;Understanding recommendations for meals to include 15-35% energy as protein, 25-35% energy from fat, 35-60% energy from carbohydrates, less than 200mg  of dietary cholesterol, 20-35 gm of total fiber  daily;Understanding of distribution of calorie intake throughout the day with the consumption of 4-5 meals/snacks;Weight Maintenance: Understanding of the daily nutrition guidelines, which includes 25-35% calories from fat, 7% or less cal from saturated fats, less than 200mg  cholesterol, less than 1.5gm of sodium, & 5 or more servings of fruits and vegetables daily    Improve shortness of breath with ADL's Yes    Intervention Provide education, individualized exercise plan and daily activity instruction to help decrease symptoms of SOB with activities of daily living.    Expected Outcomes Short Term: Improve cardiorespiratory fitness to achieve a reduction of symptoms when performing ADLs;Long Term: Be able to perform more ADLs without symptoms or delay the onset of symptoms    Personal Goal Other Yes    Personal Goal Have more energy    Intervention Participate in cardiac rehab and home exercise    Expected Outcomes She will gain more energy and stamina.             Core Components/Risk Factors/Patient Goals Review:   Goals and Risk Factor Review     Row Name 11/15/21 0815 11/30/21 0824           Core Components/Risk Factors/Patient Goals Review   Personal Goals Review Weight Management/Obesity;Improve shortness of breath with ADL's;Other Weight Management/Obesity;Improve shortness of breath with ADL's;Other      Review Patient was referred to [facility] with NSTEMI and DES. She has multiple risk factors for CAD and is participating in the program for risk modification. She has completed 2 sessions. Her personal goals for the program are to lose 20 lbs; decrease her SOB with exertion and increase her energy levels. We will continue to monitor her progress as she works towards meeting these goals. Patient has completed 7 sessions. Her initial weight was 261.0 lbs and her current weight is 262.1 lbs.  She is doing well in the program with progressions and consistent attendance. Her last A1C was 8/20  at 6.4% up from 7 years ago. She is on Jardiance. She saw Ermalinda Barrios 12/5 for a routine check. No changes made. Patient blood pressure is usually at goal. She told PA that she had ran out of her medication for 1 day. Her personal goals for the program continue to be to lose 20 lbs long term; decrease her SOB with exertion and learn how far to push herself. We will continue to monitor her progress as she works towards meeting these goals.      Expected Outcomes Patient will complete the program meeting both  personal and program goals. Patient will complete the program meeting both personal and program goals.               Core Components/Risk Factors/Patient Goals at Discharge (Final Review):   Goals and Risk Factor Review - 11/30/21 0824       Core Components/Risk Factors/Patient Goals Review   Personal Goals Review Weight Management/Obesity;Improve shortness of breath with ADL's;Other    Review Patient has completed 7 sessions. Her initial weight was 261.0 lbs and her current weight is 262.1 lbs.  She is doing well in the program with progressions and consistent attendance. Her last A1C was 8/20 at 6.4% up from 7 years ago. She is on Jardiance. She saw Ermalinda Barrios 12/5 for a routine check. No changes made. Patient blood pressure is usually at goal. She told PA that she had ran out of her medication for 1 day. Her personal goals for the program continue to be to lose 20 lbs long term; decrease her SOB with exertion and learn how far to push herself. We will continue to monitor her progress as she works towards meeting these goals.    Expected Outcomes Patient will complete the program meeting both personal and program goals.             ITP Comments:   Comments: ITP REVIEW Pt is making expected progress toward Cardiac Rehab goals after completing 8 sessions. Recommend continued exercise, life style modification, education, and increased stamina and strength.

## 2021-12-15 ENCOUNTER — Encounter (HOSPITAL_COMMUNITY): Payer: Medicare Other

## 2021-12-18 ENCOUNTER — Encounter (HOSPITAL_COMMUNITY): Payer: Medicare Other

## 2021-12-20 ENCOUNTER — Ambulatory Visit (INDEPENDENT_AMBULATORY_CARE_PROVIDER_SITE_OTHER): Payer: Medicare Other | Admitting: Cardiology

## 2021-12-20 ENCOUNTER — Encounter (HOSPITAL_COMMUNITY): Payer: Medicare Other

## 2021-12-20 ENCOUNTER — Encounter: Payer: Self-pay | Admitting: Cardiology

## 2021-12-20 ENCOUNTER — Other Ambulatory Visit: Payer: Self-pay

## 2021-12-20 VITALS — BP 130/80 | HR 88 | Ht 67.0 in | Wt 268.6 lb

## 2021-12-20 DIAGNOSIS — I255 Ischemic cardiomyopathy: Secondary | ICD-10-CM

## 2021-12-20 DIAGNOSIS — E782 Mixed hyperlipidemia: Secondary | ICD-10-CM | POA: Diagnosis not present

## 2021-12-20 DIAGNOSIS — I25119 Atherosclerotic heart disease of native coronary artery with unspecified angina pectoris: Secondary | ICD-10-CM

## 2021-12-20 DIAGNOSIS — I5020 Unspecified systolic (congestive) heart failure: Secondary | ICD-10-CM

## 2021-12-20 NOTE — Patient Instructions (Addendum)
Medication Instructions:  Your physician recommends that you continue on your current medications as directed. Please refer to the Current Medication list given to you today.  Labwork: none  Testing/Procedures: Keep echo appointment scheduled for tomorrow  Follow-Up: Your physician recommends that you schedule a follow-up appointment in: pending  Any Other Special Instructions Will Be Listed Below (If Applicable).  If you need a refill on your cardiac medications before your next appointment, please call your pharmacy.
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
## 2021-12-21 ENCOUNTER — Ambulatory Visit (HOSPITAL_COMMUNITY)
Admission: RE | Admit: 2021-12-21 | Discharge: 2021-12-21 | Disposition: A | Discharge status: Home / Self Care | Payer: Medicare Other | Source: Ambulatory Visit | Attending: Physician Assistant | Admitting: Physician Assistant

## 2021-12-21 DIAGNOSIS — I255 Ischemic cardiomyopathy: Secondary | ICD-10-CM | POA: Insufficient documentation

## 2021-12-21 LAB — ECHOCARDIOGRAM LIMITED
AR max vel: 2.22 cm2
AV Area VTI: 1.97 cm2
AV Area mean vel: 1.94 cm2
AV Mean grad: 3 mmHg
AV Peak grad: 6 mmHg
Ao pk vel: 1.22 m/s
Area-P 1/2: 3.83 cm2
Calc EF: 47.3 %
MV VTI: 2.19 cm2
S' Lateral: 3.6 cm
Single Plane A2C EF: 47.1 %
Single Plane A4C EF: 45.8 %

## 2021-12-21 NOTE — Progress Notes (Signed)
*  PRELIMINARY RESULTS* Echocardiogram 2D Echocardiogram has been performed.  Carolyne Fiscal 12/21/2021, 11:30 AM

## 2021-12-22 ENCOUNTER — Encounter (HOSPITAL_COMMUNITY): Payer: Medicare Other

## 2021-12-25 ENCOUNTER — Encounter (HOSPITAL_COMMUNITY)
Admission: RE | Admit: 2021-12-25 | Discharge: 2021-12-25 | Disposition: A | Discharge status: Home / Self Care | Payer: Medicare Other | Source: Ambulatory Visit | Attending: Cardiovascular Disease | Admitting: Cardiovascular Disease

## 2021-12-25 VITALS — Wt 262.3 lb

## 2021-12-25 DIAGNOSIS — I214 Non-ST elevation (NSTEMI) myocardial infarction: Secondary | ICD-10-CM | POA: Diagnosis present

## 2021-12-25 DIAGNOSIS — Z955 Presence of coronary angioplasty implant and graft: Secondary | ICD-10-CM | POA: Diagnosis present

## 2021-12-25 NOTE — Progress Notes (Signed)
Daily Session Note  Patient Details  Name: Annette Bryant MRN: [id number] Date of Birth: January 31, 1961 Referring Provider:   Flowsheet Row CARDIAC REHAB PHASE II ORIENTATION from 11/08/2021 in [place]  Referring Provider Dr. Acie Fredrickson       Encounter Date: 12/25/2021  Check In:  Session Check In - 12/25/21 1100       Check-In   Supervising physician immediately available to respond to emergencies [facility] MD immediately available    Physician(s) Branch    Location [facility]    Staff Present Hoy Register, MS, ACSM-CEP, Exercise Physiologist;Heather Otho Ket, BS, Exercise Physiologist;Abbie Jablon Wynetta Emery, RN, BSN    Virtual Visit No    Medication changes reported     No    Fall or balance concerns reported    No    Tobacco Cessation No Change    Warm-up and Cool-down Performed as group-led instruction    Resistance Training Performed Yes    VAD Patient? No    PAD/SET Patient? No      Pain Assessment   Currently in Pain? No/denies    Pain Score 0-No pain    Multiple Pain Sites No             Capillary Blood Glucose: No results found for this or any previous visit (from the past 24 hour(s)).    Social History   Tobacco Use  Smoking Status Never  Smokeless Tobacco Never    Goals Met:  Independence with exercise equipment Exercise tolerated well No report of concerns or symptoms today Strength training completed today  Goals Unmet:  Not Applicable  Comments: Check out 1200.   Dr. Kathie Dike is Medical Director for [facility].

## 2021-12-27 ENCOUNTER — Encounter (HOSPITAL_COMMUNITY)
Admission: RE | Admit: 2021-12-27 | Discharge: 2021-12-27 | Disposition: A | Discharge status: Home / Self Care | Payer: Medicare Other | Source: Ambulatory Visit | Attending: Cardiovascular Disease | Admitting: Cardiovascular Disease

## 2021-12-27 DIAGNOSIS — I214 Non-ST elevation (NSTEMI) myocardial infarction: Secondary | ICD-10-CM | POA: Diagnosis not present

## 2021-12-27 DIAGNOSIS — Z955 Presence of coronary angioplasty implant and graft: Secondary | ICD-10-CM

## 2021-12-27 NOTE — Progress Notes (Signed)
Daily Session Note  Patient Details  Name: Annette Bryant MRN: [id number] Date of Birth: 11-28-1961 Referring Provider:   Flowsheet Row CARDIAC REHAB PHASE II ORIENTATION from 11/08/2021 in [place]  Referring Provider Dr. Acie Fredrickson       Encounter Date: 12/27/2021  Check In:  Session Check In - 12/27/21 1100       Check-In   Supervising physician immediately available to respond to emergencies [facility] MD immediately available    Physician(s) Dr. Gardiner Rhyme    Location [facility]    Staff Present Aundra Dubin, RN, BSN;Huntley Knoop Otho Ket, BS, Exercise Physiologist;Other    Virtual Visit No    Medication changes reported     No    Fall or balance concerns reported    No    Tobacco Cessation No Change    Warm-up and Cool-down Performed as group-led instruction    Resistance Training Performed Yes    VAD Patient? No    PAD/SET Patient? No      Pain Assessment   Currently in Pain? No/denies    Pain Score 0-No pain    Multiple Pain Sites No             Capillary Blood Glucose: No results found for this or any previous visit (from the past 24 hour(s)).    Social History   Tobacco Use  Smoking Status Never  Smokeless Tobacco Never    Goals Met:  Improved SOB with ADL's Exercise tolerated well No report of concerns or symptoms today Strength training completed today  Goals Unmet:  Not Applicable  Comments: check out 1200   Dr. Kathie Dike is Medical Director for [facility].

## 2021-12-29 ENCOUNTER — Encounter (HOSPITAL_COMMUNITY)
Admission: RE | Admit: 2021-12-29 | Discharge: 2021-12-29 | Disposition: A | Discharge status: Home / Self Care | Payer: Medicare Other | Source: Ambulatory Visit | Attending: Cardiovascular Disease | Admitting: Cardiovascular Disease

## 2021-12-29 DIAGNOSIS — Z955 Presence of coronary angioplasty implant and graft: Secondary | ICD-10-CM

## 2021-12-29 DIAGNOSIS — I214 Non-ST elevation (NSTEMI) myocardial infarction: Secondary | ICD-10-CM | POA: Diagnosis not present

## 2021-12-29 NOTE — Progress Notes (Signed)
Daily Session Note  Patient Details  Name: Annette Bryant MRN: [id number] Date of Birth: 12-24-1960 Referring Provider:   Flowsheet Row CARDIAC REHAB PHASE II ORIENTATION from 11/08/2021 in [place]  Referring Provider Dr. Acie Fredrickson       Encounter Date: 12/29/2021  Check In:  Session Check In - 12/29/21 1100       Check-In   Supervising physician immediately available to respond to emergencies [facility] immediately available    Physician(s) Dr. Audie Box    Location [facility]    Staff Present Aundra Dubin, RN, BSN;Heather Otho Ket, BS, Exercise Physiologist;Chassie Pennix, Edison Simon, MS, ACSM-CEP, Exercise Physiologist    Virtual Visit No    Medication changes reported     No    Fall or balance concerns reported    No    Tobacco Cessation No Change    Warm-up and Cool-down Performed as group-led instruction    Resistance Training Performed Yes    VAD Patient? No    PAD/SET Patient? No      Pain Assessment   Currently in Pain? No/denies    Pain Score 0-No pain    Multiple Pain Sites No             Capillary Blood Glucose: No results found for this or any previous visit (from the past 24 hour(s)).    Social History   Tobacco Use  Smoking Status Never  Smokeless Tobacco Never    Goals Met:  Independence with exercise equipment Exercise tolerated well No report of concerns or symptoms today Strength training completed today  Goals Unmet:  Not Applicable  Comments: check out @ 12:00   Dr. Kathie Dike is Medical Director for [facility].

## 2022-01-01 ENCOUNTER — Encounter (HOSPITAL_COMMUNITY): Payer: Medicare Other

## 2022-01-03 ENCOUNTER — Encounter (HOSPITAL_COMMUNITY)
Admission: RE | Admit: 2022-01-03 | Discharge: 2022-01-03 | Disposition: A | Discharge status: Home / Self Care | Payer: Medicare Other | Source: Ambulatory Visit | Attending: Cardiovascular Disease | Admitting: Cardiovascular Disease

## 2022-01-03 ENCOUNTER — Other Ambulatory Visit: Payer: Self-pay | Admitting: Physician Assistant

## 2022-01-03 DIAGNOSIS — I214 Non-ST elevation (NSTEMI) myocardial infarction: Secondary | ICD-10-CM

## 2022-01-03 DIAGNOSIS — Z955 Presence of coronary angioplasty implant and graft: Secondary | ICD-10-CM

## 2022-01-03 NOTE — Progress Notes (Signed)
Daily Session Note  Patient Details  Name: Annette Bryant MRN: [id number] Date of Birth: 07-20-1961 Referring Provider:   Flowsheet Row CARDIAC REHAB PHASE II ORIENTATION from 11/08/2021 in [place]  Referring Provider Dr. Acie Fredrickson       Encounter Date: 01/03/2022  Check In:  Session Check In - 01/03/22 1100       Check-In   Supervising physician immediately available to respond to emergencies [facility] immediately available    Physician(s) Dr. Domenic Polite    Location [facility]    Staff Present Aundra Dubin, RN, BSN;Heather Otho Ket, BS, Exercise Physiologist;Camaria Gerald, Edison Simon, MS, ACSM-CEP, Exercise Physiologist    Virtual Visit No    Medication changes reported     No    Fall or balance concerns reported    No    Tobacco Cessation No Change    Warm-up and Cool-down Performed as group-led instruction    Resistance Training Performed Yes    VAD Patient? No    PAD/SET Patient? No      Pain Assessment   Currently in Pain? No/denies    Pain Score 0-No pain    Multiple Pain Sites No             Capillary Blood Glucose: No results found for this or any previous visit (from the past 24 hour(s)).    Social History   Tobacco Use  Smoking Status Never  Smokeless Tobacco Never    Goals Met:  Independence with exercise equipment Exercise tolerated well No report of concerns or symptoms today Strength training completed today  Goals Unmet:  Not Applicable  Comments: check out @ 12:00pm   Dr. Kathie Dike is Medical Director for [facility].

## 2022-01-05 ENCOUNTER — Encounter (HOSPITAL_COMMUNITY)
Admission: RE | Admit: 2022-01-05 | Discharge: 2022-01-05 | Disposition: A | Discharge status: Home / Self Care | Payer: Medicare Other | Source: Ambulatory Visit | Attending: Cardiovascular Disease | Admitting: Cardiovascular Disease

## 2022-01-05 DIAGNOSIS — I214 Non-ST elevation (NSTEMI) myocardial infarction: Secondary | ICD-10-CM | POA: Diagnosis not present

## 2022-01-05 DIAGNOSIS — Z955 Presence of coronary angioplasty implant and graft: Secondary | ICD-10-CM

## 2022-01-05 NOTE — Progress Notes (Signed)
Daily Session Note  Patient Details  Name: Annette Bryant MRN: [id number] Date of Birth: [birth year] Referring Provider:   Flowsheet Row CARDIAC REHAB PHASE II ORIENTATION from 11/08/2021 in [place]  Referring Provider Dr. Acie Fredrickson       Encounter Date: 01/05/2022  Check In:  Session Check In - 01/05/22 1100       Check-In   Supervising physician immediately available to respond to emergencies [facility] MD immediately available    Physician(s) Dr. Domenic Polite    Location [facility]    Staff Present Aundra Dubin, RN, BSN;Heather Otho Ket, BS, Exercise Physiologist;Dalton Kris Mouton, MS, ACSM-CEP, Exercise Physiologist;Other    Virtual Visit No    Medication changes reported     No    Fall or balance concerns reported    No    Tobacco Cessation No Change    Warm-up and Cool-down Performed as group-led instruction    Resistance Training Performed Yes    VAD Patient? No    PAD/SET Patient? No      Pain Assessment   Currently in Pain? No/denies    Pain Score 0-No pain    Multiple Pain Sites No             Capillary Blood Glucose: No results found for this or any previous visit (from the past 24 hour(s)).    Social History   Tobacco Use  Smoking Status Never  Smokeless Tobacco Never    Goals Met:  Independence with exercise equipment Exercise tolerated well No report of concerns or symptoms today Strength training completed today  Goals Unmet:  Not Applicable  Comments: Check out 1200.   Dr. Kathie Dike is Medical Director for [facility].

## 2022-01-08 ENCOUNTER — Encounter (HOSPITAL_COMMUNITY)
Admission: RE | Admit: 2022-01-08 | Discharge: 2022-01-08 | Disposition: A | Discharge status: Home / Self Care | Payer: Medicare Other | Source: Ambulatory Visit | Attending: Cardiovascular Disease | Admitting: Cardiovascular Disease

## 2022-01-08 VITALS — Wt 261.7 lb

## 2022-01-08 DIAGNOSIS — Z955 Presence of coronary angioplasty implant and graft: Secondary | ICD-10-CM

## 2022-01-08 DIAGNOSIS — I214 Non-ST elevation (NSTEMI) myocardial infarction: Secondary | ICD-10-CM | POA: Diagnosis not present

## 2022-01-08 NOTE — Progress Notes (Signed)
Daily Session Note  Patient Details  Name: Annette Bryant MRN: [id number] Date of Birth: 12-26-1960 Referring Provider:   Flowsheet Row CARDIAC REHAB PHASE II ORIENTATION from 11/08/2021 in [place]  Referring Provider Dr. Acie Fredrickson       Encounter Date: 01/08/2022  Check In:  Session Check In - 01/08/22 1100       Check-In   Supervising physician immediately available to respond to emergencies [facility] immediately available    Physician(s) Dr.Pemberton    Location [facility]    Staff Present Geanie Cooley, RN;Dalton Kris Mouton, MS, ACSM-CEP, Exercise Physiologist;Debra Wynetta Emery, RN, BSN    Virtual Visit No    Medication changes reported     No    Fall or balance concerns reported    No    Tobacco Cessation No Change    Warm-up and Cool-down Performed as group-led instruction    Resistance Training Performed Yes    VAD Patient? No    PAD/SET Patient? No      Pain Assessment   Currently in Pain? No/denies    Pain Score 0-No pain    Multiple Pain Sites No             Capillary Blood Glucose: No results found for this or any previous visit (from the past 24 hour(s)).    Social History   Tobacco Use  Smoking Status Never  Smokeless Tobacco Never    Goals Met:  Independence with exercise equipment Exercise tolerated well No report of concerns or symptoms today Strength training completed today  Goals Unmet:  Not Applicable  Comments: check out @ 12:00   Dr. Kathie Dike is Medical Director for [facility].

## 2022-01-10 ENCOUNTER — Encounter (HOSPITAL_COMMUNITY)
Admission: RE | Admit: 2022-01-10 | Discharge: 2022-01-10 | Disposition: A | Discharge status: Home / Self Care | Payer: Medicare Other | Source: Ambulatory Visit | Attending: Cardiovascular Disease | Admitting: Cardiovascular Disease

## 2022-01-10 DIAGNOSIS — I214 Non-ST elevation (NSTEMI) myocardial infarction: Secondary | ICD-10-CM | POA: Diagnosis present

## 2022-01-10 DIAGNOSIS — Z955 Presence of coronary angioplasty implant and graft: Secondary | ICD-10-CM | POA: Insufficient documentation

## 2022-01-10 NOTE — Progress Notes (Signed)
Daily Session Note  Patient Details  Name: Annette Bryant MRN: [id number] Date of Birth: 14-Dec-1960 Referring Provider:   Flowsheet Row CARDIAC REHAB PHASE II ORIENTATION from 11/08/2021 in [place]  Referring Provider Dr. Acie Fredrickson       Encounter Date: 01/10/2022  Check In:  Session Check In - 01/10/22 1100       Check-In   Supervising physician immediately available to respond to emergencies [facility] immediately available    Physician(s) Dr. Harl Bowie    Location [facility]    Staff Present Aundra Dubin, RN, BSN;Heather Otho Ket, BS, Exercise Physiologist;Costas Sena Kris Mouton, MS, ACSM-CEP, Exercise Physiologist;Other    Virtual Visit No    Medication changes reported     No    Fall or balance concerns reported    No    Tobacco Cessation No Change    Warm-up and Cool-down Performed as group-led instruction    Resistance Training Performed Yes    VAD Patient? No    PAD/SET Patient? No      Pain Assessment   Currently in Pain? No/denies    Pain Score 0-No pain    Multiple Pain Sites No             Capillary Blood Glucose: No results found for this or any previous visit (from the past 24 hour(s)).    Social History   Tobacco Use  Smoking Status Never  Smokeless Tobacco Never    Goals Met:  Independence with exercise equipment Exercise tolerated well No report of concerns or symptoms today Strength training completed today  Goals Unmet:  Not Applicable  Comments: checkout time is 1200   Dr. Kathie Dike is Medical Director for [facility].

## 2022-01-10 NOTE — Progress Notes (Signed)
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 barriers to participate in program The patient should benefit from training in stress management and relaxation.      Screening Interventions   Interventions Encouraged to exercise;Provide feedback about the scores to participant    Expected Outcomes Long Term goal: The participant improves quality of Life and PHQ9 Scores as seen by post scores and/or verbalization of changes;Short Term goal: Identification and review with participant of any Quality of Life or Depression concerns found by scoring the questionnaire.             Quality of Life Scores:  Quality of Life - 11/08/21 1416       Quality of Life   Select Quality of Life      Quality of Life Scores   Health/Function Pre 23.14 %    Socioeconomic Pre 23.4 %    Psych/Spiritual Pre 24.86 %    Family Pre 28.5 %    GLOBAL Pre 24.3 %            Scores of 19 and below usually indicate a poorer quality of life in these areas.  A difference of  2-3  points is a clinically meaningful difference.  A difference of 2-3 points in the total score of the Quality of Life Index has been associated with significant improvement in overall quality of life, self-image, physical symptoms, and general health in studies assessing change in quality of life.  PHQ-9: Recent Review Flowsheet Data     [facility]   Decreased Interest 1   Down, Depressed, Hopeless 0   PHQ - 2 Score 1   Altered sleeping 1   Tired, decreased energy 1   Change in appetite 0   Feeling bad or failure about yourself  1   Trouble concentrating 0   Moving slowly or fidgety/restless 0   Suicidal thoughts 0   PHQ-9 Score 4   Difficult doing work/chores Not difficult at all      Interpretation of Total Score  Total Score Depression Severity:  1-4 = Minimal depression, 5-9 = Mild depression, 10-14 = Moderate depression, 15-19 = Moderately severe depression, 20-27 = Severe depression   Psychosocial Evaluation and Intervention:  Psychosocial Evaluation - 11/08/21 1332       Psychosocial Evaluation & Interventions   Interventions Stress management education;Relaxation education;Encouraged to exercise with the program and follow exercise prescription    Comments Pt has no barriers to completing rehab. She does have anxiety and depression which is treated with xanax prn. 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 This has caused her a lot of sadness and she brought it up several times during the orientation. She feels betrayed by him and has still not gotten over it. She reports that she is coping well on her own and declined a therapist referral. She has a good support system with her four children and her church friends. She is currently attending night school to get her associates degree in divinity. She reports that she does have problems controlling her eating. She states that she often overeats  to cope with her emotions and this  has caused her to gain a significant amount of weight over the years. Her goals while in the program are to lose about 20 lbs, decrease her SOB with exertion, and to increase her energy levels. She is nervous about starting the program, but she is hopeful that she will benefit from it.    Expected Outcomes Pt's depression and anxiety will continue to be managed with xanax and pt will have no other identifiable psychosocial issues.    Continue Psychosocial Services  No Follow up required             Psychosocial Re-Evaluation:  Psychosocial Re-Evaluation     Row Name 11/15/21 [phone] 11/30/21 0821 01/01/22 0834         Psychosocial Re-Evaluation   Current issues with Current Depression;Current Anxiety/Panic Current Depression;Current Anxiety/Panic Current Depression;Current Anxiety/Panic     Comments Patient is new the program completeing 2 sessions. Her psychosocial issues continue to be managed with Alaprazolam. We will continue to monitor her progress. Patient has completed 7 sessions with no psychosocial barriers identified. Her depression and anxiety continue to be managed with Alprazolam. She seems to enjoy coming to the program and demonstrates an interest in improving her health. She is very interactive with the staff and others in her class. We will continue to monitor. Patient has completed 11 sessions with no psychosocial barriers identified. Her depression and anxiety continue to be managed with Alprazolam. She seems to enjoy coming to the program and demonstrates an interest in improving her health. She is very interactive with the staff and others in her class. We will continue to monitor.     Expected Outcomes Patient's psychosocial issues will continue to be managed. Patient's psychosocial issues will continue to be managed. Patient's psychosocial issues will continue to be managed.     Interventions -- Stress management education;Encouraged to  attend Cardiac Rehabilitation for the exercise;Relaxation education Stress management education;Encouraged to attend Cardiac Rehabilitation for the exercise;Relaxation education     Continue Psychosocial Services  No Follow up required No Follow up required No Follow up required              Psychosocial Discharge (Final Psychosocial Re-Evaluation):  Psychosocial Re-Evaluation - 01/01/22 0834       Psychosocial Re-Evaluation   Current issues with Current Depression;Current Anxiety/Panic    Comments Patient has completed 11 sessions with no psychosocial barriers identified. Her depression and anxiety continue to be managed with Alprazolam. She seems to enjoy coming to the program and demonstrates an interest in improving her health. She is very interactive with the staff and others in her class. We will continue to monitor.    Expected Outcomes Patient's psychosocial issues will continue to be managed.    Interventions Stress management education;Encouraged to attend Cardiac Rehabilitation for the exercise;Relaxation education    Continue Psychosocial Services  No Follow up required             Vocational Rehabilitation: Provide vocational rehab assistance to qualifying candidates.   Vocational Rehab Evaluation & Intervention:  Vocational Rehab - 11/08/21 1321       Initial Vocational Rehab Evaluation & Intervention   Assessment shows need for Vocational Rehabilitation No             Education: Education Goals: Education classes will be provided on a weekly basis, covering required topics. Participant will state understanding/return demonstration of topics presented.  Learning Barriers/Preferences:  Learning Barriers/Preferences - 11/08/21 1317       Learning Barriers/Preferences  Learning Barriers None    Learning Preferences Written Material             Education Topics: Hypertension, Hypertension Reduction -Define heart disease and high blood pressure.  Discus how high blood pressure affects the body and ways to reduce high blood pressure.   Exercise and Your Heart -Discuss why it is important to exercise, the FITT principles of exercise, normal and abnormal responses to exercise, and how to exercise safely. Flowsheet Row CARDIAC REHAB PHASE II EXERCISE from 01/03/2022 in [place] CARDIAC REHABILITATION  Date 12/27/21  Educator hj  Instruction Review Code 2- Demonstrated Understanding       Angina -Discuss definition of angina, causes of angina, treatment of angina, and how to decrease risk of having angina. Flowsheet Row CARDIAC REHAB PHASE II EXERCISE from 01/03/2022 in [place] CARDIAC REHABILITATION  Date 01/03/22  Educator pb  Instruction Review Code 1- Verbalizes Understanding       Cardiac Medications -Review what the following cardiac medications are used for, how they affect the body, and side effects that may occur when taking the medications.  Medications include Aspirin, Beta blockers, calcium channel blockers, ACE Inhibitors, angiotensin receptor blockers, diuretics, digoxin, and antihyperlipidemics.   Congestive Heart Failure -Discuss the definition of CHF, how to live with CHF, the signs and symptoms of CHF, and how keep track of weight and sodium intake.   Heart Disease and Intimacy -Discus the effect sexual activity has on the heart, how changes occur during intimacy as we age, and safety during sexual activity.   Smoking Cessation / COPD -Discuss different methods to quit smoking, the health benefits of quitting smoking, and the definition of COPD.   Nutrition I: Fats -Discuss the types of cholesterol, what cholesterol does to the heart, and how cholesterol levels can be controlled.   Nutrition II: Labels -Discuss the different components of food labels and how to read food label Flowsheet Row CARDIAC REHAB PHASE II EXERCISE from 01/03/2022 in [place] CARDIAC REHABILITATION  Date 11/15/21   Educator HJ  Instruction Review Code 2- Demonstrated Understanding       Heart Parts/Heart Disease and PAD -Discuss the anatomy of the heart, the pathway of blood circulation through the heart, and these are affected by heart disease. Flowsheet Row CARDIAC REHAB PHASE II EXERCISE from 01/03/2022 in [place] CARDIAC REHABILITATION  Date 11/22/21  Educator pb  Instruction Review Code 1- Verbalizes Understanding       Stress I: Signs and Symptoms -Discuss the causes of stress, how stress may lead to anxiety and depression, and ways to limit stress. Flowsheet Row CARDIAC REHAB PHASE II EXERCISE from 01/03/2022 in [place] CARDIAC REHABILITATION  Date 11/29/21  Educator hj  Instruction Review Code 2- Demonstrated Understanding       Stress II: Relaxation -Discuss different types of relaxation techniques to limit stress.   Warning Signs of Stroke / TIA -Discuss definition of a stroke, what the signs and symptoms are of a stroke, and how to identify when someone is having stroke.   Knowledge Questionnaire Score:  Knowledge Questionnaire Score - 11/08/21 1317       Knowledge Questionnaire Score   Pre Score 18/24             Core Components/Risk Factors/Patient Goals at Admission:  Personal Goals and Risk Factors at Admission - 11/08/21 1321       Core Components/Risk Factors/Patient Goals on Admission    Weight Management Yes;Obesity;Weight Loss    Intervention  Weight Management: Develop a combined nutrition and exercise program designed to reach desired caloric intake, while maintaining appropriate intake of nutrient and fiber, sodium and fats, and appropriate energy expenditure required for the weight goal.;Weight Management: Provide education and appropriate resources to help participant work on and attain dietary goals.;Weight Management/Obesity: Establish reasonable short term and long term weight goals.;Obesity: Provide education and appropriate resources to  help participant work on and attain dietary goals.    Admit Weight 260 lb (117.9 kg)    Goal Weight: Short Term 240 lb (108.9 kg)    Expected Outcomes Short Term: Continue to assess and modify interventions until short term weight is achieved;Long Term: Adherence to nutrition and physical activity/exercise program aimed toward attainment of established weight goal;Weight Loss: Understanding of general recommendations for a balanced deficit meal plan, which promotes 1-2 lb weight loss per week and includes a negative energy balance of [phone] kcal/d;Understanding recommendations for meals to include 15-35% energy as protein, 25-35% energy from fat, 35-60% energy from carbohydrates, less than 200mg  of dietary cholesterol, 20-35 gm of total fiber daily;Understanding of distribution of calorie intake throughout the day with the consumption of 4-5 meals/snacks;Weight Maintenance: Understanding of the daily nutrition guidelines, which includes 25-35% calories from fat, 7% or less cal from saturated fats, less than 200mg  cholesterol, less than 1.5gm of sodium, & 5 or more servings of fruits and vegetables daily    Improve shortness of breath with ADL's Yes    Intervention Provide education, individualized exercise plan and daily activity instruction to help decrease symptoms of SOB with activities of daily living.    Expected Outcomes Short Term: Improve cardiorespiratory fitness to achieve a reduction of symptoms when performing ADLs;Long Term: Be able to perform more ADLs without symptoms or delay the onset of symptoms    Personal Goal Other Yes    Personal Goal Have more energy    Intervention Participate in cardiac rehab and home exercise    Expected Outcomes She will gain more energy and stamina.             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 She has multiple risk factors for CAD and is participating in the program for risk modification. She has completed 2 sessions. Her personal goals for the program are to lose 20 lbs; decrease her SOB with exertion and increase her energy levels. We will continue to monitor her progress as she works towards meeting these goals. Patient has completed 7 sessions. Her initial weight was 261.0 lbs and her current weight is 262.1 lbs.  She is doing well in the program with progressions and consistent attendance. Her last A1C was 8/20 at 6.4% up from 7 years ago. She is on Jardiance. She saw Jacolyn Reedy 12/5 for a routine check. No changes made. Patient blood pressure is usually at goal. She told PA that she had ran out of her medication for 1 day. Her personal goals for the program continue to be to lose 20 lbs long term; decrease her SOB with exertion and learn how far to push herself. We will  continue to monitor her progress as she works towards meeting these goals. Patient has completed 11 sessions. Her initial weight was 261.0 lbs and her current weight is 262.4 lbs.  She had to miss several sessions due to issues with her pipes freezing and bursting during Christmas when she was out of town. She continues to do well in the program with progressions and consistent attendance. Her last A1C was 8/20 at 6.4%. She saw Dr. Diona Browner 1/11 for routine check with Echo 11/12 showing improved EF up from 44% in 8/22 to 45-50%. No changes made to medical management plan. Patient's blood pressure continues to be at goal.  Her personal goals for the program continue to be to lose 20 lbs long term; decrease her SOB with exertion and  learn how far to push herself. We will continue to monitor her progress as she works towards meeting these goals.     Expected Outcomes Patient will complete the program meeting both personal and program goals. Patient will complete the program meeting both personal and program goals. Patient will complete the program meeting both personal and program goals.              Core Components/Risk Factors/Patient Goals at Discharge (Final Review):   Goals and Risk Factor Review - 01/01/22 0743       Core Components/Risk Factors/Patient Goals Review   Personal Goals Review Weight Management/Obesity;Improve shortness of breath with ADL's;Other    Review Patient has completed 11 sessions. Her initial weight was 261.0 lbs and her current weight is 262.4 lbs.  She had to miss several sessions due to issues with her pipes freezing and bursting during Christmas when she was out of town. She continues to do well in the program with progressions and consistent attendance. Her last A1C was 8/20 at 6.4%. She saw Dr. Diona Browner 1/11 for routine check with Echo 11/12 showing improved EF up from 44% in 8/22 to 45-50%. No changes made to medical management plan. Patient's blood pressure continues to be at goal.  Her personal goals for the program continue to be to lose 20 lbs long term; decrease her SOB with exertion and learn how far to push herself. We will continue to monitor her progress as she works towards meeting these goals.    Expected Outcomes Patient will complete the program meeting both personal and program goals.             ITP Comments:   Comments: ITP REVIEW Pt is making expected progress toward Cardiac Rehab goals after completing 14 sessions. Recommend continued exercise, life style modification, education, and increased stamina and strength.

## 2022-01-12 ENCOUNTER — Encounter (HOSPITAL_COMMUNITY)
Admission: RE | Admit: 2022-01-12 | Discharge: 2022-01-12 | Disposition: A | Discharge status: Home / Self Care | Payer: Medicare Other | Source: Ambulatory Visit | Attending: Cardiovascular Disease | Admitting: Cardiovascular Disease

## 2022-01-12 DIAGNOSIS — I214 Non-ST elevation (NSTEMI) myocardial infarction: Secondary | ICD-10-CM | POA: Diagnosis not present

## 2022-01-12 DIAGNOSIS — Z955 Presence of coronary angioplasty implant and graft: Secondary | ICD-10-CM

## 2022-01-12 NOTE — Progress Notes (Signed)
Daily Session Note  Patient Details  Name: Annette Bryant MRN: [id number] Date of Birth: 1961-10-27 Referring Provider:   Flowsheet Row CARDIAC REHAB PHASE II ORIENTATION from 11/08/2021 in [place]  Referring Provider Dr. Acie Fredrickson       Encounter Date: 01/12/2022  Check In:  Session Check In - 01/12/22 1100       Check-In   Supervising physician immediately available to respond to emergencies [facility] MD immediately available    Physician(s) Dr. Harrington Challenger    Location [facility]    Staff Present Aundra Dubin, RN, BSN;Heather Otho Ket, BS, Exercise Physiologist;Geanine Vandekamp Kris Mouton, MS, ACSM-CEP, Exercise Physiologist;Other    Virtual Visit No    Medication changes reported     No    Fall or balance concerns reported    No    Tobacco Cessation No Change    Warm-up and Cool-down Performed as group-led instruction    Resistance Training Performed Yes    VAD Patient? No    PAD/SET Patient? No      Pain Assessment   Currently in Pain? No/denies    Pain Score 0-No pain    Multiple Pain Sites No             Capillary Blood Glucose: No results found for this or any previous visit (from the past 24 hour(s)).    Social History   Tobacco Use  Smoking Status Never  Smokeless Tobacco Never    Goals Met:  Independence with exercise equipment Exercise tolerated well No report of concerns or symptoms today Strength training completed today  Goals Unmet:  Not Applicable  Comments: checkout time is 1200   Dr. Kathie Dike is Medical Director for [facility].

## 2022-01-15 ENCOUNTER — Encounter (HOSPITAL_COMMUNITY)
Admission: RE | Admit: 2022-01-15 | Discharge: 2022-01-15 | Disposition: A | Discharge status: Home / Self Care | Payer: Medicare Other | Source: Ambulatory Visit | Attending: Cardiovascular Disease | Admitting: Cardiovascular Disease

## 2022-01-15 DIAGNOSIS — I214 Non-ST elevation (NSTEMI) myocardial infarction: Secondary | ICD-10-CM

## 2022-01-15 DIAGNOSIS — Z955 Presence of coronary angioplasty implant and graft: Secondary | ICD-10-CM

## 2022-01-15 NOTE — Progress Notes (Signed)
Daily Session Note  Patient Details  Name: Annette Bryant MRN: [id number] Date of Birth: [birth year] Referring Provider:   Flowsheet Row CARDIAC REHAB PHASE II ORIENTATION from 11/08/2021 in [place]  Referring Provider Dr. Acie Fredrickson       Encounter Date: 01/15/2022  Check In:  Session Check In - 01/15/22 1100       Check-In   Supervising physician immediately available to respond to emergencies [facility] MD immediately available    Physician(s) Dr. Audie Box    Location [facility]    Staff Present Aundra Dubin, RN, BSN;Heather Otho Ket, BS, Exercise Physiologist;[place] Kris Mouton, MS, ACSM-CEP, Exercise Physiologist    Virtual Visit No    Medication changes reported     No    Fall or balance concerns reported    No    Tobacco Cessation No Change    Warm-up and Cool-down Performed as group-led instruction    Resistance Training Performed Yes    VAD Patient? No    PAD/SET Patient? No      Pain Assessment   Currently in Pain? No/denies    Pain Score 0-No pain    Multiple Pain Sites No             Capillary Blood Glucose: No results found for this or any previous visit (from the past 24 hour(s)).    Social History   Tobacco Use  Smoking Status Never  Smokeless Tobacco Never    Goals Met:  Independence with exercise equipment Exercise tolerated well No report of concerns or symptoms today Strength training completed today  Goals Unmet:  Not Applicable  Comments: checkout time is 1200   Dr. Carlyle Dolly is Medical Director for [place]

## 2022-01-17 ENCOUNTER — Encounter (HOSPITAL_COMMUNITY)
Admission: RE | Admit: 2022-01-17 | Discharge: 2022-01-17 | Disposition: A | Discharge status: Home / Self Care | Payer: Medicare Other | Source: Ambulatory Visit | Attending: Cardiovascular Disease | Admitting: Cardiovascular Disease

## 2022-01-17 DIAGNOSIS — I214 Non-ST elevation (NSTEMI) myocardial infarction: Secondary | ICD-10-CM | POA: Diagnosis not present

## 2022-01-17 DIAGNOSIS — Z955 Presence of coronary angioplasty implant and graft: Secondary | ICD-10-CM

## 2022-01-17 NOTE — Progress Notes (Signed)
Daily Session Note  Patient Details  Name: Annette Bryant MRN: [id number] Date of Birth: 17-Jul-1961 Referring Provider:   Flowsheet Row CARDIAC REHAB PHASE II ORIENTATION from 11/08/2021 in [place]  Referring Provider Dr. Acie Fredrickson       Encounter Date: 01/17/2022  Check In:  Session Check In - 01/17/22 1100       Check-In   Supervising physician immediately available to respond to emergencies [facility] MD immediately available    Physician(s) Dr. Johnsie Cancel    Location [facility]    Staff Present Aundra Dubin, RN, BSN;Heather Otho Ket, BS, Exercise Physiologist;Bejamin Hackbart Kris Mouton, MS, ACSM-CEP, Exercise Physiologist    Virtual Visit No    Medication changes reported     No    Fall or balance concerns reported    No    Tobacco Cessation No Change    Warm-up and Cool-down Performed as group-led instruction    Resistance Training Performed Yes    VAD Patient? No    PAD/SET Patient? No      Pain Assessment   Currently in Pain? No/denies    Pain Score 0-No pain    Multiple Pain Sites No             Capillary Blood Glucose: No results found for this or any previous visit (from the past 24 hour(s)).    Social History   Tobacco Use  Smoking Status Never  Smokeless Tobacco Never    Goals Met:  Independence with exercise equipment Exercise tolerated well No report of concerns or symptoms today Strength training completed today  Goals Unmet:  Not Applicable  Comments: checkout time is 1200   Dr. Carlyle Dolly is Medical Director for [place]

## 2022-01-19 ENCOUNTER — Encounter (HOSPITAL_COMMUNITY)
Admission: RE | Admit: 2022-01-19 | Discharge: 2022-01-19 | Disposition: A | Discharge status: Home / Self Care | Payer: Medicare Other | Source: Ambulatory Visit | Attending: Cardiovascular Disease | Admitting: Cardiovascular Disease

## 2022-01-19 DIAGNOSIS — Z955 Presence of coronary angioplasty implant and graft: Secondary | ICD-10-CM

## 2022-01-19 DIAGNOSIS — I214 Non-ST elevation (NSTEMI) myocardial infarction: Secondary | ICD-10-CM

## 2022-01-19 NOTE — Progress Notes (Signed)
Daily Session Note  Patient Details  Name: Annette Bryant MRN: [id number] Date of Birth: 09-26-1961 Referring Provider:   Flowsheet Row CARDIAC REHAB PHASE II ORIENTATION from 11/08/2021 in [place]  Referring Provider Dr. Acie Fredrickson       Encounter Date: 01/19/2022  Check In:  Session Check In - 01/19/22 1100       Check-In   Supervising physician immediately available to respond to emergencies [facility] MD immediately available    Physician(s) Dr Marlou Porch    Location [facility]    Staff Present Aundra Dubin, RN, Madlyn Frankel, RN, Bjorn Loser, MS, ACSM-CEP, Exercise Physiologist;Heather Zigmund [place], Exercise Physiologist    Virtual Visit No    Medication changes reported     No    Fall or balance concerns reported    No    Tobacco Cessation No Change    Warm-up and Cool-down Performed as group-led instruction    Resistance Training Performed Yes    VAD Patient? No    PAD/SET Patient? No      Pain Assessment   Currently in Pain? No/denies    Pain Score 0-No pain    Multiple Pain Sites No             Capillary Blood Glucose: No results found for this or any previous visit (from the past 24 hour(s)).    Social History   Tobacco Use  Smoking Status Never  Smokeless Tobacco Never    Goals Met:  Independence with exercise equipment Exercise tolerated well No report of concerns or symptoms today  Goals Unmet:  Not Applicable  Comments: checkout 1200    Dr. Carlyle Dolly is Medical Director for [place]

## 2022-01-22 ENCOUNTER — Encounter (HOSPITAL_COMMUNITY)
Admission: RE | Admit: 2022-01-22 | Discharge: 2022-01-22 | Disposition: A | Discharge status: Home / Self Care | Payer: Medicare Other | Source: Ambulatory Visit | Attending: Cardiovascular Disease | Admitting: Cardiovascular Disease

## 2022-01-22 VITALS — Wt 264.1 lb

## 2022-01-22 DIAGNOSIS — I214 Non-ST elevation (NSTEMI) myocardial infarction: Secondary | ICD-10-CM | POA: Diagnosis not present

## 2022-01-22 DIAGNOSIS — Z955 Presence of coronary angioplasty implant and graft: Secondary | ICD-10-CM

## 2022-01-22 NOTE — Progress Notes (Signed)
Daily Session Note  Patient Details  Name: Annette Bryant MRN: [id number] Date of Birth: 11/07/1961 Referring Provider:   Flowsheet Row CARDIAC REHAB PHASE II ORIENTATION from 11/08/2021 in [place]  Referring Provider Dr. Acie Fredrickson       Encounter Date: 01/22/2022  Check In:  Session Check In - 01/22/22 1100       Check-In   Supervising physician immediately available to respond to emergencies [facility] MD immediately available    Physician(s) Dr Harl Bowie    Location [facility]    Staff Present Geanie Cooley, RN;Debra Wynetta Emery, RN, Madlyn Frankel, RN, Bjorn Loser, MS, ACSM-CEP, Exercise Physiologist;Heather Zigmund Tania Steinhauser, Exercise Physiologist    Virtual Visit No    Medication changes reported     No    Fall or balance concerns reported    No    Tobacco Cessation No Change    Warm-up and Cool-down Performed as group-led instruction    Resistance Training Performed Yes    VAD Patient? No    PAD/SET Patient? No      Pain Assessment   Currently in Pain? No/denies    Pain Score 0-No pain    Multiple Pain Sites No             Capillary Blood Glucose: No results found for this or any previous visit (from the past 24 hour(s)).    Social History   Tobacco Use  Smoking Status Never  Smokeless Tobacco Never    Goals Met:  Independence with exercise equipment Exercise tolerated well No report of concerns or symptoms today Strength training completed today  Goals Unmet:  Not Applicable  Comments: checkout 1200    Dr. Carlyle Dolly is Medical Director for [place]

## 2022-01-24 ENCOUNTER — Encounter (HOSPITAL_COMMUNITY)
Admission: RE | Admit: 2022-01-24 | Discharge: 2022-01-24 | Disposition: A | Discharge status: Home / Self Care | Payer: Medicare Other | Source: Ambulatory Visit | Attending: Cardiovascular Disease | Admitting: Cardiovascular Disease

## 2022-01-24 DIAGNOSIS — Z955 Presence of coronary angioplasty implant and graft: Secondary | ICD-10-CM

## 2022-01-24 DIAGNOSIS — I214 Non-ST elevation (NSTEMI) myocardial infarction: Secondary | ICD-10-CM

## 2022-01-24 NOTE — Progress Notes (Signed)
Daily Session Note  Patient Details  Name: Annette Bryant MRN: [id number] Date of Birth: August 14, 1961 Referring Provider:   Flowsheet Row CARDIAC REHAB PHASE II ORIENTATION from 11/08/2021 in [facility]  Referring Provider Dr. Acie Fredrickson       Encounter Date: 01/24/2022  Check In:  Session Check In - 01/24/22 1100       Check-In   Supervising physician immediately available to respond to emergencies [facility] MD immediately available    Physician(s) Dr Harl Bowie    Location [facility]    Staff Present Hoy Register, MS, ACSM-CEP, Exercise Physiologist;Heather Otho Ket, BS, Exercise Physiologist;Kennley Schwandt Wynetta Emery, RN, BSN    Virtual Visit No    Medication changes reported     No    Fall or balance concerns reported    No    Tobacco Cessation No Change    Warm-up and Cool-down Performed as group-led instruction    Resistance Training Performed Yes    VAD Patient? No    PAD/SET Patient? No      Pain Assessment   Currently in Pain? No/denies    Pain Score 0-No pain    Multiple Pain Sites No             Capillary Blood Glucose: No results found for this or any previous visit (from the past 24 hour(s)).    Social History   Tobacco Use  Smoking Status Never  Smokeless Tobacco Never    Goals Met:  Independence with exercise equipment Exercise tolerated well No report of concerns or symptoms today Strength training completed today  Goals Unmet:  Not Applicable  Comments: Check out 1200.   Dr. Carlyle Dolly is Medical Director for [facility]

## 2022-01-26 ENCOUNTER — Encounter (HOSPITAL_COMMUNITY)
Admission: RE | Admit: 2022-01-26 | Discharge: 2022-01-26 | Disposition: A | Discharge status: Home / Self Care | Payer: Medicare Other | Source: Ambulatory Visit | Attending: Cardiovascular Disease | Admitting: Cardiovascular Disease

## 2022-01-26 DIAGNOSIS — I214 Non-ST elevation (NSTEMI) myocardial infarction: Secondary | ICD-10-CM | POA: Diagnosis not present

## 2022-01-26 DIAGNOSIS — Z955 Presence of coronary angioplasty implant and graft: Secondary | ICD-10-CM

## 2022-01-26 NOTE — Progress Notes (Signed)
Daily Session Note  Patient Details  Name: Annette Bryant MRN: [id number] Date of Birth: [birth year] Referring Provider:   Flowsheet Row CARDIAC REHAB PHASE II ORIENTATION from 11/08/2021 in [place]  Referring Provider Dr. Acie Fredrickson       Encounter Date: 01/26/2022  Check In:  Session Check In - 01/26/22 1100       Check-In   Supervising physician immediately available to respond to emergencies [facility] MD immediately available    Physician(s) Dr. Harrington Challenger    Location [facility]    Staff Present Geanie Cooley, RN;Dalton Kris Mouton, MS, ACSM-CEP, Exercise Physiologist;Heather Zigmund Daniel, Exercise Physiologist    Virtual Visit No    Medication changes reported     No    Fall or balance concerns reported    No    Tobacco Cessation No Change    Warm-up and Cool-down Performed as group-led instruction    Resistance Training Performed Yes    VAD Patient? No    PAD/SET Patient? No      Pain Assessment   Currently in Pain? No/denies    Pain Score 0-No pain    Multiple Pain Sites No             Capillary Blood Glucose: No results found for this or any previous visit (from the past 24 hour(s)).    Social History   Tobacco Use  Smoking Status Never  Smokeless Tobacco Never    Goals Met:  Independence with exercise equipment Exercise tolerated well No report of concerns or symptoms today Strength training completed today  Goals Unmet:  Not Applicable  Comments: check out @ 12:00pm   Dr. Carlyle Dolly is Medical Director for [facility]

## 2022-01-29 ENCOUNTER — Encounter (HOSPITAL_COMMUNITY)
Admission: RE | Admit: 2022-01-29 | Discharge: 2022-01-29 | Disposition: A | Discharge status: Home / Self Care | Payer: Medicare Other | Source: Ambulatory Visit | Attending: Cardiovascular Disease | Admitting: Cardiovascular Disease

## 2022-01-29 DIAGNOSIS — I214 Non-ST elevation (NSTEMI) myocardial infarction: Secondary | ICD-10-CM

## 2022-01-29 DIAGNOSIS — Z955 Presence of coronary angioplasty implant and graft: Secondary | ICD-10-CM

## 2022-01-29 NOTE — Progress Notes (Signed)
Daily Session Note  Patient Details  Name: Annette Bryant MRN: [id number] Date of Birth: [birth year] Referring Provider:   Flowsheet Row CARDIAC REHAB PHASE II ORIENTATION from 11/08/2021 in [place]  Referring Provider Dr. Acie Fredrickson       Encounter Date: 01/29/2022  Check In:  Session Check In - 01/29/22 1108       Check-In   Supervising physician immediately available to respond to emergencies [facility] immediately available    Physician(s) Dr. Harl Bowie    Location [facility]    Staff Present Hoy Register, MS, ACSM-CEP, Exercise Physiologist;Heather Zigmund Daniel, Exercise Physiologist;Rena Sweeden Wynetta Emery, RN, BSN    Virtual Visit No    Medication changes reported     No    Fall or balance concerns reported    No    Tobacco Cessation No Change    Warm-up and Cool-down Performed as group-led instruction    Resistance Training Performed Yes    VAD Patient? No    PAD/SET Patient? No      Pain Assessment   Currently in Pain? No/denies    Pain Score 0-No pain    Multiple Pain Sites No             Capillary Blood Glucose: No results found for this or any previous visit (from the past 24 hour(s)).    Social History   Tobacco Use  Smoking Status Never  Smokeless Tobacco Never    Goals Met:  Independence with exercise equipment Exercise tolerated well No report of concerns or symptoms today Strength training completed today  Goals Unmet:  Not Applicable  Comments: Check out 1200.   Dr. Carlyle Dolly is Medical Director for [facility]

## 2022-01-31 ENCOUNTER — Encounter (HOSPITAL_COMMUNITY)
Admission: RE | Admit: 2022-01-31 | Discharge: 2022-01-31 | Disposition: A | Discharge status: Home / Self Care | Payer: Medicare Other | Source: Ambulatory Visit | Attending: Cardiovascular Disease | Admitting: Cardiovascular Disease

## 2022-01-31 DIAGNOSIS — I214 Non-ST elevation (NSTEMI) myocardial infarction: Secondary | ICD-10-CM

## 2022-01-31 DIAGNOSIS — Z955 Presence of coronary angioplasty implant and graft: Secondary | ICD-10-CM

## 2022-01-31 NOTE — Progress Notes (Signed)
Daily Session Note  Patient Details  Name: Annette Bryant MRN: [id number] Date of Birth: [birth year] Referring Provider:   Flowsheet Row CARDIAC REHAB PHASE II ORIENTATION from 11/08/2021 in [place]  Referring Provider Dr. Acie Fredrickson       Encounter Date: 01/31/2022  Check In:  Session Check In - 01/31/22 1100       Check-In   Supervising physician immediately available to respond to emergencies [facility] MD immediately available    Physician(s) Dr. Domenic Polite    Location [facility]    Staff Present Hoy Register, MS, ACSM-CEP, Exercise Physiologist;Heather Otho Ket, BS, Exercise Physiologist;Debra Wynetta Emery, RN, BSN    Virtual Visit No    Medication changes reported     No    Fall or balance concerns reported    No    Tobacco Cessation No Change    Warm-up and Cool-down Performed as group-led instruction    Resistance Training Performed Yes    VAD Patient? No    PAD/SET Patient? No      Pain Assessment   Currently in Pain? No/denies    Pain Score 0-No pain    Multiple Pain Sites No             Capillary Blood Glucose: No results found for this or any previous visit (from the past 24 hour(s)).    Social History   Tobacco Use  Smoking Status Never  Smokeless Tobacco Never    Goals Met:  Independence with exercise equipment Exercise tolerated well No report of concerns or symptoms today Strength training completed today  Goals Unmet:  Not Applicable  Comments: checkout time is 1200   Dr. Carlyle Dolly is Medical Director for [facility]

## 2022-02-02 ENCOUNTER — Encounter (HOSPITAL_COMMUNITY)
Admission: RE | Admit: 2022-02-02 | Discharge: 2022-02-02 | Disposition: A | Discharge status: Home / Self Care | Payer: Medicare Other | Source: Ambulatory Visit | Attending: Cardiovascular Disease | Admitting: Cardiovascular Disease

## 2022-02-02 VITALS — Ht 67.0 in | Wt 258.4 lb

## 2022-02-02 DIAGNOSIS — I214 Non-ST elevation (NSTEMI) myocardial infarction: Secondary | ICD-10-CM | POA: Diagnosis not present

## 2022-02-02 DIAGNOSIS — Z955 Presence of coronary angioplasty implant and graft: Secondary | ICD-10-CM

## 2022-02-02 NOTE — Progress Notes (Signed)
Daily Session Note  Patient Details  Name: LANAE FEDERER MRN: [id number] Date of Birth: 25-Feb-1961 Referring Provider:   Flowsheet Row CARDIAC REHAB PHASE II ORIENTATION from 11/08/2021 in [place]  Referring Provider Dr. Acie Fredrickson       Encounter Date: 02/02/2022  Check In:  Session Check In - 02/02/22 1100       Check-In   Supervising physician immediately available to respond to emergencies [facility] MD immediately available    Physician(s) Dr. Domenic Polite    Location [facility]    Staff Present Redge Gainer, BS, Exercise Physiologist;Heike Pounds Wynetta Emery, RN, BSN;Other   Benay Pillow   Virtual Visit No    Medication changes reported     No    Fall or balance concerns reported    No    Tobacco Cessation No Change    Warm-up and Cool-down Performed as group-led Higher education careers adviser Performed Yes    VAD Patient? No    PAD/SET Patient? No      Pain Assessment   Currently in Pain? No/denies    Pain Score 0-No pain    Multiple Pain Sites No             Capillary Blood Glucose: No results found for this or any previous visit (from the past 24 hour(s)).    Social History   Tobacco Use  Smoking Status Never  Smokeless Tobacco Never    Goals Met:  Independence with exercise equipment Exercise tolerated well No report of concerns or symptoms today Strength training completed today  Goals Unmet:  Not Applicable  Comments: Check out 1200.   Dr. Carlyle Dolly is Medical Director for [facility]
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
## 2022-03-05 ENCOUNTER — Emergency Department (HOSPITAL_COMMUNITY)
Admission: EM | Admit: 2022-03-05 | Discharge: 2022-03-05 | Disposition: A | Discharge status: Home / Self Care | Payer: Medicare Other | Attending: Emergency Medicine | Admitting: Emergency Medicine

## 2022-03-05 ENCOUNTER — Encounter (HOSPITAL_COMMUNITY): Payer: Self-pay | Admitting: Emergency Medicine

## 2022-03-05 ENCOUNTER — Emergency Department (HOSPITAL_COMMUNITY): Payer: Medicare Other

## 2022-03-05 ENCOUNTER — Other Ambulatory Visit: Payer: Self-pay

## 2022-03-05 DIAGNOSIS — Z7982 Long term (current) use of aspirin: Secondary | ICD-10-CM | POA: Insufficient documentation

## 2022-03-05 DIAGNOSIS — M25561 Pain in right knee: Secondary | ICD-10-CM | POA: Diagnosis not present

## 2022-03-05 DIAGNOSIS — W19XXXA Unspecified fall, initial encounter: Secondary | ICD-10-CM | POA: Insufficient documentation

## 2022-03-05 DIAGNOSIS — Z79899 Other long term (current) drug therapy: Secondary | ICD-10-CM | POA: Diagnosis not present

## 2022-03-05 NOTE — ED Triage Notes (Signed)
Pt to the ED this morning after a fall onto her right knee. ? ?

## 2022-03-05 NOTE — ED Provider Notes (Signed)
?[facility] ?Provider Note ? ? ?CSN: [id number] ?Arrival date & time: 03/05/22  7209 ? ?  ? ?History ? ?Chief Complaint  ?Patient presents with  ? Fall  ? ? ?Annette Bryant is a 61 y.o. female. ? ?HPI ? ?Patient without pertinent medical history presents with complaining of right knee pain.  Patient states that she was going over to a friend's house when she missed stepped falling directly onto her right knee.  She denies falling hitting her head or losing conscious, she is not on anticoag.  She states her only complaint is her right knee pain, she states she needed help getting up, she states she is able to ambulate but now needs to use a cane.  Pain is mainly on the medial aspect of her knee, does not radiate down her leg denies any paresthesias or weakness in that leg is able to move her toes ankle, and move her knee but it hurts when she does so.  She has not had anything for pain.  She has no other complaints. ? ?Home Medications ?Prior to Admission medications   ?Medication Sig Start Date End Date Taking? Authorizing Provider  ?albuterol (PROVENTIL) (2.5 MG/3ML) 0.083% nebulizer solution Take 2.5 mg by nebulization every 6 (six) hours as needed for wheezing or shortness of breath.     [provider]  ?ALPRAZolam Prudy Feeler) 1 MG tablet Take 1 tablet (1 mg total) by mouth 2 (two) times daily. ?Patient taking differently: Take 1 mg by mouth 2 (two) times daily as needed for anxiety. 10/29/17   Myrlene Broker, MD  ?aspirin 81 MG EC tablet Take 1 tablet (81 mg total) by mouth daily. Swallow whole. 08/02/21   Kroeger, Ovidio Kin., PA-C  ?budesonide-formoterol (SYMBICORT) 160-4.5 MCG/ACT inhaler Inhale 2 puffs into the lungs 2 (two) times daily.    [provider]  ?cholecalciferol (VITAMIN D3) 25 MCG (1000 UNIT) tablet Take 1,000 Units by mouth daily.    [provider]  ?dicyclomine (BENTYL) 20 MG tablet Take 1 tablet (20 mg total) by mouth 2 (two) times daily. ?Patient  not taking: Reported on 12/20/2021 09/21/21   Jacalyn Lefevre, MD  ?furosemide (LASIX) 40 MG tablet Take 40 mg by mouth daily as needed for fluid or edema.     [provider]  ?HYDROcodone-acetaminophen (NORCO) 7.5-325 MG tablet Take 1 tablet by mouth 3 (three) times daily as needed for severe pain. 09/24/21   [provider]  ?HYDROcodone-acetaminophen (NORCO/VICODIN) 5-325 MG tablet Take 1 tablet by mouth every 4 (four) hours as needed. ?Patient not taking: Reported on 12/20/2021 09/21/21   Jacalyn Lefevre, MD  ?KLOR-CON M20 20 MEQ tablet Take 20 mEq by mouth daily as needed (when taking furosemide). ?Patient not taking: Reported on 12/20/2021 08/21/20   [provider]  ?metoprolol tartrate (LOPRESSOR) 25 MG tablet TAKE 1 TABLET BY MOUTH TWICE A DAY 01/03/22   Laurann Montana, PA-C  ?Multiple Vitamin (MULTIVITAMIN WITH MINERALS) TABS tablet Take 1 tablet by mouth daily.    [provider]  ?nitroGLYCERIN (NITROSTAT) 0.4 MG SL tablet Place 1 tablet (0.4 mg total) under the tongue every 5 (five) minutes x 3 doses as needed for chest pain. 11/13/21   Dyann Kief, PA-C  ?Polyethyl Glycol-Propyl Glycol (SYSTANE) 0.4-0.3 % SOLN Apply 1 drop to eye daily as needed (dry eyes).    [provider]  ?Potassium Chloride ER 20 MEQ TBCR Take 1 tablet by mouth daily. 12/06/21   [provider]  ?PROAIR HFA 108 (90 BASE) MCG/ACT inhaler Inhale 1-2 puffs into the lungs every 6 (six) hours as needed for wheezing or shortness of breath. 06/27/15   [provider]  ?rosuvastatin (CRESTOR) 40 MG tablet Take 1 tablet (40 mg total) by mouth daily. ?Patient taking differently: Take 40 mg by mouth at bedtime. 10/30/21   Jonelle SidleMcDowell, Samuel G, MD  ?ticagrelor (BRILINTA) 90 MG TABS tablet Take 1 tablet (90 mg total) by mouth 2 (two) times daily. 10/30/21   Jonelle SidleMcDowell, Samuel G, MD  ?   ? ?Allergies    ?Patient has no known allergies.   ? ?Review of Systems   ?Review of Systems   ?Constitutional:  Negative for chills and fever.  ?Respiratory:  Negative for shortness of breath.   ?Cardiovascular:  Negative for chest pain.  ?Gastrointestinal:  Negative for abdominal pain.  ?Musculoskeletal:   ?     Right knee pain  ?Neurological:  Negative for headaches.  ? ?Physical Exam ?Updated Vital Signs ?Ht 5\' 7"  (1.702 m)   Wt (!) 147.2 kg   LMP 07/11/2017 (Exact Date)   BMI 50.83 kg/m?  ?Physical Exam ?Vitals and nursing note reviewed.  ?Constitutional:   ?   General: She is not in acute distress. ?   Appearance: Normal appearance. She is not ill-appearing.  ?   Comments: Well-appearing no acute distress  ?HENT:  ?   Head: Normocephalic and atraumatic.  ?   Nose: No congestion or rhinorrhea.  ?Eyes:  ?   Conjunctiva/sclera: Conjunctivae normal.  ?Cardiovascular:  ?   Rate and Rhythm: Normal rate and regular rhythm.  ?   Pulses: Normal pulses.  ?Pulmonary:  ?   Effort: Pulmonary effort is normal.  ?Musculoskeletal:  ?   Cervical back: Neck supple.  ?   Comments: Right knee was visualized there is no gross deformities noted no overlying skin changes, she was tender to palpation at the medial tibial plateau, no crepitus or deformities noted.  Calf is nontender to palpation.  She is able to move her toes ankle and knee but needed assistance that does hurt to do it.  She had good 2+ dorsal pedal pulses.  ?Skin: ?   General: Skin is warm and dry.  ?   Coloration: Skin is not jaundiced or pale.  ?Neurological:  ?   Mental Status: She is alert.  ?Psychiatric:     ?   Mood and Affect: Mood normal.  ? ? ?ED Results / Procedures / Treatments   ?Labs ?(all labs ordered are listed, but only abnormal results are displayed) ?Labs Reviewed - No data to display ? ?EKG ?None ? ?Radiology ?DG Knee Complete 4 Views Right ? ?Result Date: 03/05/2022 ?CLINICAL DATA:  Medial tibial plateau pain. Fell on right knee this morning. EXAM: RIGHT KNEE - COMPLETE 4+ VIEW COMPARISON:  Right knee radiographs 10/26/2021. FINDINGS: No  joint effusion. Mild chronic enthesopathic spurring at the quadriceps insertion on the patella. Joint spaces are preserved. No acute fracture or dislocation. Mild superior and inferior patellar degenerative osteophytes. IMPRESSION: Mild patellofemoral osteoarthritis, similar to prior. No acute fracture is seen. Electronically Signed   By: Neita Garnetonald  Viola M.D.   On: 03/05/2022 10:17   ? ?Procedures ?Procedures  ? ? ?Medications Ordered in ED ?Medications - No data to display ? ?ED Course/ Medical Decision Making/ A&P ?  ?                        ?  Medical Decision Making ?Amount and/or Complexity of Data Reviewed ?Radiology: ordered. ? ? ?This patient presents to the ED for concern of knee pain, this involves an extensive number of treatment options, and is a complaint that carries with it a high risk of complications and morbidity.  The differential diagnosis includes fracture, dislocation, compartment syndrome ? ? ? ?Additional history obtained: ? ?Additional history obtained from N/A ?External records from outside source obtained and reviewed including N/A ? ? ?Co morbidities that complicate the patient evaluation ? ?N/A ? ?Social Determinants of Health: ? ?N/A ? ? ? ?Lab Tests: ? ?I Ordered, and personally interpreted labs.  The pertinent results include: N/A ? ? ?Imaging Studies ordered: ? ?I ordered imaging studies including DG of right knee ?I independently visualized and interpreted imaging which showed negative for acute findings ?I agree with the radiologist interpretation ? ? ?Cardiac Monitoring: ? ?The patient was maintained on a cardiac monitor.  I personally viewed and interpreted the cardiac monitored which showed an underlying rhythm of: N/A ? ? ?Medicines ordered and prescription drug management: ? ?I ordered medication including N/A ?I have reviewed the patients home medicines and have made adjustments as needed ? ?Critical Interventions: ? ?N/A ? ? ?Reevaluation: ? ?Presents with knee pain tender on my  exam, concern for possible repeat injury will obtain imaging for further evaluation. ? ?Reassessed updated on imaging resting comfortably having no complaints ready for discharge ? ?Consultations Obtained: ? ?N

## 2022-03-05 NOTE — Discharge Instructions (Signed)
You have been seen here for knee pain, given you a knee brace please wear for comfort. I recommend taking over-the-counter pain medications like ibuprofen and/or Tylenol every 6 as needed.  Please follow dosage and on the back of bottle.  I also recommend applying heat or ice to the area will help with pain ? ?Please follow-up with orthopedics or your PCP for reevaluation if needed ? ?Come back to the emergency department if you develop chest pain, shortness of breath, severe abdominal pain, uncontrolled nausea, vomiting, diarrhea. ? ?

## 2022-03-08 ENCOUNTER — Ambulatory Visit (INDEPENDENT_AMBULATORY_CARE_PROVIDER_SITE_OTHER): Payer: Medicare Other | Admitting: Orthopaedic Surgery

## 2022-03-08 ENCOUNTER — Encounter: Payer: Self-pay | Admitting: Orthopaedic Surgery

## 2022-03-08 VITALS — BP 122/74 | HR 85 | Ht 67.0 in

## 2022-03-08 DIAGNOSIS — G8929 Other chronic pain: Secondary | ICD-10-CM

## 2022-03-08 DIAGNOSIS — M25561 Pain in right knee: Secondary | ICD-10-CM | POA: Diagnosis not present

## 2022-03-08 NOTE — Progress Notes (Signed)
I hurt my right knee ? ?On 03-05-22 she went to a friend's house to help out and fell and hurt her right knee.  She said the house had been remodeled and she did not know a step down was present.  She was seen in the ER and had X-rays.  I have reviewed the notes and the X-rays.  She has no other injury. ? ?She has more pain medially of the right knee, swelling and popping.  She is using a cane.  She has used ice and elevation. ? ?Right knee has effusion, crepitus, ROM 0 to 105, medial joint line pain. 1+ medial laxity, weakly positive medial McMurray, NV intact, no distal edema. ? ?Encounter Diagnosis  ?Name Primary?  ? Chronic pain of right knee Yes  ? ?PROCEDURE NOTE: ? ?The patient requests injections of the right knee , verbal consent was obtained. ? ?The right knee was prepped appropriately after time out was performed.  ? ?Sterile technique was observed and injection of 1 cc of DepoMedrol 40mg  with several cc's of plain xylocaine. Anesthesia was provided by ethyl chloride and a 20-gauge needle was used to inject the knee area. The injection was tolerated well.  A band aid dressing was applied. ? ?The patient was advised to apply ice later today and tomorrow to the injection sight as needed. ? ?I have given knee sleeve to use. ? ?She may need MRI.   ? ?Return in two weeks. ? ?Call if any problem. ? ?Precautions discussed. ? ?Electronically Signed ? , MD ?3/30/20238:43 AM ? ?

## 2022-03-22 ENCOUNTER — Ambulatory Visit (INDEPENDENT_AMBULATORY_CARE_PROVIDER_SITE_OTHER): Payer: Medicare Other | Admitting: Orthopaedic Surgery

## 2022-03-22 ENCOUNTER — Ambulatory Visit: Payer: Medicare Other | Admitting: Orthopedic Surgery

## 2022-03-22 ENCOUNTER — Encounter: Payer: Self-pay | Admitting: Orthopaedic Surgery

## 2022-03-22 DIAGNOSIS — M25561 Pain in right knee: Secondary | ICD-10-CM | POA: Diagnosis not present

## 2022-03-22 DIAGNOSIS — G8929 Other chronic pain: Secondary | ICD-10-CM

## 2022-03-22 NOTE — Addendum Note (Signed)
Addended by: Cherre Huger E on: 03/22/2022 11:20 AM ? ? Modules accepted: Orders ? ?

## 2022-03-22 NOTE — Progress Notes (Signed)
My knee is worse. ? ?The injection of the right knee did not help that much.  She is using the brace.  Her knees gives way without the brace and swells and pops. ? ?I will get MRI of the right knee.  I am concerned about torn meniscus. ? ?Right knee has effusion, crepitus, ROM 0 to 105 with medial pain, positive medial McMurray, limp right, uses cane, NV intact.  No distal edema. ? ?Encounter Diagnosis  ?Name Primary?  ? Chronic pain of right knee Yes  ? ?MRI is ordered. ? ?Return in two weeks. ? ?Call if any problem. ? ?Precautions discussed. ? ?Electronically Signed ?Darreld Mclean, MD ?4/13/202311:11 AM ? ?

## 2022-04-10 ENCOUNTER — Ambulatory Visit (HOSPITAL_COMMUNITY)
Admission: RE | Admit: 2022-04-10 | Discharge: 2022-04-10 | Disposition: A | Discharge status: Home / Self Care | Payer: Medicare Other | Source: Ambulatory Visit | Attending: Orthopaedic Surgery | Admitting: Orthopaedic Surgery

## 2022-04-10 DIAGNOSIS — M25561 Pain in right knee: Secondary | ICD-10-CM | POA: Diagnosis not present

## 2022-04-10 DIAGNOSIS — G8929 Other chronic pain: Secondary | ICD-10-CM | POA: Diagnosis present

## 2022-04-12 ENCOUNTER — Ambulatory Visit (INDEPENDENT_AMBULATORY_CARE_PROVIDER_SITE_OTHER): Payer: Medicare Other | Admitting: Orthopaedic Surgery

## 2022-04-12 ENCOUNTER — Encounter: Payer: Self-pay | Admitting: Orthopaedic Surgery

## 2022-04-12 DIAGNOSIS — G8929 Other chronic pain: Secondary | ICD-10-CM

## 2022-04-12 DIAGNOSIS — M25561 Pain in right knee: Secondary | ICD-10-CM | POA: Diagnosis not present

## 2022-04-12 NOTE — Progress Notes (Signed)
My knee still hurts. ? ?She had MRI of the right knee showing: ?IMPRESSION: ?1. Radial tear of the root of the posterior horn of the medial ?meniscus with peripheral meniscal extrusion. Mild degeneration of ?the posterior horn of the medial meniscus. ?2. Severe degeneration of the posterior horn of the lateral meniscus ?towards the meniscal root with possible small partial radial tear. ?3. Proximal MCL strain with a small partial-thickness tear. ?4. Tricompartmental cartilage abnormalities as described above. ?5. Partial-thickness cartilage loss of the posterior ?nonweightbearing surface of the lateral femoral condyle with severe ?subchondral bone marrow edema somewhat disproportionate to the ?degree of overlying cartilage loss and may reflect an osseous ?contusion. ?6. Moderate joint effusion. ? ?I have explained the findings to her.  I will have her see Dr. Romeo Apple for possible arthroscopy.  She is agreeable to this. ? ?I have independently reviewed the MRI.   ? ?Right knee has knee sleeve, pain medially, limp right, ROM 0 to 105, positive medial McMurray, NV intact. ? ?Encounter Diagnosis  ?Name Primary?  ? Chronic pain of right knee Yes  ? ?To see Dr. Romeo Apple ? ?Call if any problem. ? ?Precautions discussed. ? ?Electronically Signed ?Darreld Mclean, MD ?5/4/202310:54 AM ? ?

## 2022-04-25 ENCOUNTER — Ambulatory Visit: Payer: Medicare Other | Admitting: Orthopedic Surgery

## 2022-05-08 ENCOUNTER — Telehealth: Payer: Self-pay | Admitting: Orthopaedic Surgery

## 2022-05-08 NOTE — Telephone Encounter (Signed)
Patient called to request to cancel her (rescheduled) appointment for tomorrow, 05/09/22, with Dr Romeo Apple, per last visit with Dr Hilda Lias, for consultation, right knee; states the knee is feeling better; offered reschedule to discuss with Dr Romeo Apple, said she wants to just cancel.

## 2022-05-09 ENCOUNTER — Ambulatory Visit: Payer: Medicare Other | Admitting: Orthopedic Surgery

## 2022-08-14 ENCOUNTER — Other Ambulatory Visit (HOSPITAL_COMMUNITY): Payer: Self-pay | Admitting: Family Medicine

## 2022-08-14 DIAGNOSIS — R928 Other abnormal and inconclusive findings on diagnostic imaging of breast: Secondary | ICD-10-CM

## 2022-08-28 ENCOUNTER — Inpatient Hospital Stay (HOSPITAL_COMMUNITY): Admission: RE | Admit: 2022-08-28 | Payer: Medicare Other | Source: Ambulatory Visit

## 2022-08-28 ENCOUNTER — Ambulatory Visit (HOSPITAL_COMMUNITY): Payer: Medicare Other

## 2022-08-28 ENCOUNTER — Ambulatory Visit (HOSPITAL_COMMUNITY): Admission: RE | Admit: 2022-08-28 | Payer: Medicare Other | Source: Ambulatory Visit

## 2022-08-29 ENCOUNTER — Ambulatory Visit (INDEPENDENT_AMBULATORY_CARE_PROVIDER_SITE_OTHER): Payer: Medicare Other | Admitting: Orthopaedic Surgery

## 2022-08-29 ENCOUNTER — Encounter: Payer: Self-pay | Admitting: Orthopaedic Surgery

## 2022-08-29 ENCOUNTER — Ambulatory Visit (INDEPENDENT_AMBULATORY_CARE_PROVIDER_SITE_OTHER): Payer: Medicare Other

## 2022-08-29 ENCOUNTER — Other Ambulatory Visit: Payer: Self-pay | Admitting: Orthopaedic Surgery

## 2022-08-29 VITALS — BP 127/88 | HR 94 | Ht 67.0 in | Wt 272.0 lb

## 2022-08-29 DIAGNOSIS — M25562 Pain in left knee: Secondary | ICD-10-CM

## 2022-08-29 NOTE — Progress Notes (Signed)
My left knee hurts now.  I have been seeing her for right knee pain.  She has developed pain in the left knee.  She has no trauma, but has swelling and popping.  She has no redness, no numbness.  She wears a brace on the right knee. She has tried ice, heat, Advil with no help.  Left knee is tender, has effusion, crepitus, ROM 0 to 105, more pain medially, weakly positive medial McMurray, NV intact, no distal edema.  X-rays were done of the left knee, reported separately.  Encounter Diagnosis  Name Primary?   Acute pain of left knee Yes   PROCEDURE NOTE:  The patient requests injections of the left knee , verbal consent was obtained.  The left knee was prepped appropriately after time out was performed.   Sterile technique was observed and injection of 1 cc of DepoMedrol 40 mg with several cc's of plain xylocaine. Anesthesia was provided by ethyl chloride and a 20-gauge needle was used to inject the knee area. The injection was tolerated well.  A band aid dressing was applied.  The patient was advised to apply ice later today and tomorrow to the injection sight as needed.  Return in two weeks.  Call if any problem.  Precautions discussed.  Electronically Signed Sanjuana Kava, MD 9/20/20239:55 AM

## 2022-09-10 ENCOUNTER — Other Ambulatory Visit: Payer: Self-pay | Admitting: Cardiology

## 2022-09-12 ENCOUNTER — Ambulatory Visit (INDEPENDENT_AMBULATORY_CARE_PROVIDER_SITE_OTHER): Payer: Medicare Other | Admitting: Orthopedic Surgery

## 2022-09-12 DIAGNOSIS — G8929 Other chronic pain: Secondary | ICD-10-CM

## 2022-09-12 DIAGNOSIS — M25561 Pain in right knee: Secondary | ICD-10-CM

## 2022-09-12 MED ORDER — METHYLPREDNISOLONE ACETATE 40 MG/ML IJ SUSP
40.0000 mg | Freq: Once | INTRAMUSCULAR | Status: AC
  Administered 2022-09-12: 40 mg via INTRA_ARTICULAR

## 2022-09-12 NOTE — Addendum Note (Signed)
Addended byCandice Camp on: 09/12/2022 10:16 AM   Modules accepted: Orders

## 2022-09-12 NOTE — Patient Instructions (Signed)

## 2022-09-12 NOTE — Progress Notes (Signed)
Chief Complaint  Patient presents with   Knee Pain    Rt knee pain wants injection    Encounter Diagnosis  Name Primary?   Chronic pain of right knee Yes     61 year old female had MRI and x-rays and was thought to need arthroscopic surgery but her symptoms have improved she says she is just having arthritis pain with dull aching  She comes in walking without support she is not limping she has no pain or tenderness to palpation no effusion excellent range of motion with good strength and stability  We will perform an injection as requested by the patient  Procedure note right knee injection   verbal consent was obtained to inject right knee joint  Timeout was completed to confirm the site of injection  The medications used were depomedrol 40 mg and 1% lidocaine 3 cc Anesthesia was provided by ethyl chloride and the skin was prepped with alcohol.  After cleaning the skin with alcohol a 20-gauge needle was used to inject the right knee joint. There were no complications. A sterile bandage was applied.

## 2022-09-18 ENCOUNTER — Ambulatory Visit (HOSPITAL_COMMUNITY)
Admission: RE | Admit: 2022-09-18 | Discharge: 2022-09-18 | Disposition: A | Discharge status: Home / Self Care | Payer: Medicare Other | Source: Ambulatory Visit | Attending: Family Medicine | Admitting: Family Medicine

## 2022-09-18 ENCOUNTER — Encounter (HOSPITAL_COMMUNITY): Payer: Self-pay

## 2022-09-18 DIAGNOSIS — R928 Other abnormal and inconclusive findings on diagnostic imaging of breast: Secondary | ICD-10-CM

## 2022-10-04 ENCOUNTER — Other Ambulatory Visit: Payer: Self-pay | Admitting: Physician Assistant

## 2022-10-04 ENCOUNTER — Telehealth: Payer: Self-pay | Admitting: Cardiology

## 2022-10-04 MED ORDER — ASPIRIN 81 MG PO TBEC: 81.0000 mg | DELAYED_RELEASE_TABLET | Freq: Every day | ORAL | 3 refills | Status: DC

## 2022-10-04 NOTE — Telephone Encounter (Signed)
*  STAT* If patient is at the pharmacy, call can be transferred to refill team.   1. Which medications need to be refilled? (please list name of each medication and dose if known) aspirin 81 MG EC tablet  2. Which pharmacy/location (including street and city if local pharmacy) is medication to be sent to? [id number] - [place], [place] - [address]  3. Do they need a 30 day or 90 day supply? [place]

## 2022-10-04 NOTE — Telephone Encounter (Signed)
Refilled as requested  

## 2022-12-19 ENCOUNTER — Encounter: Payer: Self-pay | Admitting: Orthopaedic Surgery

## 2022-12-19 ENCOUNTER — Ambulatory Visit (INDEPENDENT_AMBULATORY_CARE_PROVIDER_SITE_OTHER): Payer: 59 | Admitting: Orthopaedic Surgery

## 2022-12-19 ENCOUNTER — Ambulatory Visit (INDEPENDENT_AMBULATORY_CARE_PROVIDER_SITE_OTHER): Payer: 59

## 2022-12-19 VITALS — BP 119/71 | HR 77 | Ht 67.0 in | Wt 278.0 lb

## 2022-12-19 DIAGNOSIS — M25562 Pain in left knee: Secondary | ICD-10-CM | POA: Diagnosis not present

## 2022-12-19 DIAGNOSIS — M25571 Pain in right ankle and joints of right foot: Secondary | ICD-10-CM

## 2022-12-19 DIAGNOSIS — G8929 Other chronic pain: Secondary | ICD-10-CM

## 2022-12-19 MED ORDER — METHYLPREDNISOLONE ACETATE 40 MG/ML IJ SUSP
40.0000 mg | Freq: Once | INTRAMUSCULAR | Status: AC
  Administered 2022-12-19: 40 mg via INTRA_ARTICULAR

## 2022-12-19 NOTE — Addendum Note (Signed)
Addended by: Obie Dredge A on: 12/19/2022 10:49 AM   Modules accepted: Orders

## 2022-12-19 NOTE — Progress Notes (Signed)
My knee is hurting but also my right ankle  She has developed pain in the right ankle over the last month with lateral pain. She has some swelling at times.  She has no trauma.  She uses a cane.  She has no redness.  The left knee is hurting again.  She has swelling and popping but no giving way.  Right ankle had X-rays done, reported separately.  Left knee with ROM 0 to 105, crepitus, effusion, medial pain, stable, NV intact, no distal edema.  Right ankle with pain over posterior part of lateral malleolus and the peroneal tendon area.  No swelling, no redness, full ROM.  Stable.  NV intact.  Encounter Diagnoses  Name Primary?   Chronic pain of right ankle Yes   Chronic pain of left knee    PROCEDURE NOTE:  The patient requests injections of the left knee , verbal consent was obtained.  The left knee was prepped appropriately after time out was performed.   Sterile technique was observed and injection of 1 cc of DepoMedrol 40 mg with several cc's of plain xylocaine. Anesthesia was provided by ethyl chloride and a 20-gauge needle was used to inject the knee area. The injection was tolerated well.  A band aid dressing was applied.  The patient was advised to apply ice later today and tomorrow to the injection sight as needed.  Use Aspercreme, Biofreeze or Voltaren gel to ankle.  Use one Aleve twice a day.  Return in two weeks to check ankle.  Call if any problem.  Precautions discussed.  Electronically Signed Sanjuana Kava, MD 1/10/20249:35 AM

## 2022-12-19 NOTE — Patient Instructions (Signed)
 Aspercreme, Biofreeze, Blue Emu or Voltaren Gel over the counter 2-3 times daily. Rub into area well each use for best results.  Dr.Keeling is here all day on Tuesdays. He is here half a day on Wednesday mornings, and Thursday mornings. If you need anything such as a medication refill, please either call BEFORE the end of the day on [facility] or send a message through [place]. Your pharmacy can send a refill request for you. Calling by the end of the day on [facility] allows [place] time to send Dr.Keeling the request and for him to respond before he leaves on Thursdays.   My name is  and I assist [place]. If you need anything before your next appointment, please do not hesitate to call the office at [phone] and ask to leave a message for me. I will respond within 24-48 business hours.

## 2022-12-20 ENCOUNTER — Encounter: Payer: Medicare Other | Admitting: Orthopaedic Surgery

## 2023-01-02 ENCOUNTER — Ambulatory Visit (INDEPENDENT_AMBULATORY_CARE_PROVIDER_SITE_OTHER): Payer: 59 | Admitting: Orthopaedic Surgery

## 2023-01-02 ENCOUNTER — Encounter: Payer: Self-pay | Admitting: Orthopaedic Surgery

## 2023-01-02 VITALS — BP 140/82 | HR 89 | Ht 67.0 in | Wt 278.0 lb

## 2023-01-02 DIAGNOSIS — G8929 Other chronic pain: Secondary | ICD-10-CM | POA: Diagnosis not present

## 2023-01-02 DIAGNOSIS — M25571 Pain in right ankle and joints of right foot: Secondary | ICD-10-CM

## 2023-01-02 NOTE — Progress Notes (Signed)
My ankle is a little better.  She has less pain of the right ankle.  She has no redness and very little swelling.  She has no new trauma.  ROM of the right ankle is full.  There is slight tenderness of the posterior to lateral malleolus in the peroneal tendon area.  There is no redness.  NV intact.  Encounter Diagnosis  Name Primary?   Chronic pain of right ankle Yes   Continue the Aleve and the rubs.  Return in three weeks.  If worse, may consider injection.  Call if any problem.  Precautions discussed.  Electronically Signed Sanjuana Kava, MD 1/24/20249:02 AM

## 2023-01-04 ENCOUNTER — Other Ambulatory Visit: Payer: Self-pay | Admitting: Physician Assistant

## 2023-01-12 ENCOUNTER — Other Ambulatory Visit: Payer: Self-pay | Admitting: Physician Assistant

## 2023-01-22 ENCOUNTER — Ambulatory Visit: Payer: 59 | Admitting: Orthopaedic Surgery

## 2023-02-07 ENCOUNTER — Encounter: Payer: Self-pay | Admitting: Radiology

## 2023-02-15 ENCOUNTER — Other Ambulatory Visit: Payer: Self-pay

## 2023-02-15 MED ORDER — METOPROLOL TARTRATE 25 MG PO TABS: ORAL_TABLET | ORAL | 0 refills | Status: DC

## 2023-02-25 ENCOUNTER — Other Ambulatory Visit: Payer: Self-pay | Admitting: Cardiology

## 2023-04-16 NOTE — Patient Instructions (Signed)

## 2023-04-17 ENCOUNTER — Encounter: Payer: Self-pay | Admitting: Family Medicine

## 2023-04-17 ENCOUNTER — Ambulatory Visit (INDEPENDENT_AMBULATORY_CARE_PROVIDER_SITE_OTHER): Payer: 59 | Admitting: Family Medicine

## 2023-04-17 VITALS — BP 142/98 | HR 86 | Ht 67.0 in | Wt 276.0 lb

## 2023-04-17 DIAGNOSIS — Z131 Encounter for screening for diabetes mellitus: Secondary | ICD-10-CM

## 2023-04-17 DIAGNOSIS — Z13220 Encounter for screening for lipoid disorders: Secondary | ICD-10-CM

## 2023-04-17 DIAGNOSIS — I10 Essential (primary) hypertension: Secondary | ICD-10-CM | POA: Diagnosis not present

## 2023-04-17 DIAGNOSIS — Z1159 Encounter for screening for other viral diseases: Secondary | ICD-10-CM

## 2023-04-17 DIAGNOSIS — E559 Vitamin D deficiency, unspecified: Secondary | ICD-10-CM

## 2023-04-17 DIAGNOSIS — Z1329 Encounter for screening for other suspected endocrine disorder: Secondary | ICD-10-CM

## 2023-04-17 NOTE — Progress Notes (Signed)
New Patient Office Visit   Subjective   Patient ID: Annette Bryant, female    DOB: 21-Apr-1961  Age: 62 y.o. MRN: [id number]  CC:  Chief Complaint  Patient presents with   Establish Care    HPI Annette Bryant 62 year old female, presents to establish care. She  has a past medical history of Anxiety, Arthritis, Asthma, CAD (coronary artery disease), Carpal tunnel syndrome, COPD (chronic obstructive pulmonary disease) (HCC), Depression, Essential hypertension, HFrEF (heart failure with reduced ejection fraction) (HCC), HLD (hyperlipidemia), Hypertension, Ischemic cardiomyopathy, Morbid obesity (HCC), OSA on CPAP, Panic attacks, and Pre-diabetes.  Hypertension: Patient here for  elevated blood pressure. She is exercising patient reports walking every other day and is not adherent to low salt diet.  Blood pressure unknown well controlled at home. Patient denies cardiac symptoms chest pain, chest pressure/discomfort, dyspnea, lower extremity edema, palpitations, syncope, and tachypnea. Cardiovascular risk factors: dyslipidemia, hypertension, obesity (BMI >= 30 kg/m2), and sedentary lifestyle.      Outpatient Encounter Medications as of 04/17/2023  Medication Sig   albuterol (PROVENTIL) (2.5 MG/3ML) 0.083% nebulizer solution Take 2.5 mg by nebulization every 6 (six) hours as needed for wheezing or shortness of breath.   ALPRAZolam (XANAX) 1 MG tablet Take 1 tablet (1 mg total) by mouth 2 (two) times daily. (Patient taking differently: Take 1 mg by mouth 2 (two) times daily as needed for anxiety.)   aspirin EC 81 MG tablet Take 1 tablet (81 mg total) by mouth daily. Swallow whole.   budesonide-formoterol (SYMBICORT) 160-4.5 MCG/ACT inhaler Inhale 2 puffs into the lungs 2 (two) times daily.   cholecalciferol (VITAMIN D3) 25 MCG (1000 UNIT) tablet Take 1,000 Units by mouth daily.   furosemide (LASIX) 40 MG tablet Take 40 mg by mouth daily as needed for fluid or edema.    KLOR-CON M20 20 MEQ  tablet Take 20 mEq by mouth daily as needed (when taking furosemide).   metoprolol tartrate (LOPRESSOR) 25 MG tablet TAKE 1 TABLET 2 TIMES DAILY.   MOUNJARO 10 MG/0.5ML Pen Inject into the skin once a week.   Multiple Vitamin (MULTIVITAMIN WITH MINERALS) TABS tablet Take 1 tablet by mouth daily.   nitroGLYCERIN (NITROSTAT) 0.4 MG SL tablet PLACE 1 TABLET UNDER THE TONGUE EVERY 5 (FIVE) MINUTES X 3 DOSES AS NEEDED FOR CHEST PAIN.   Potassium Chloride ER 20 MEQ TBCR Take 1 tablet by mouth daily.   PROAIR HFA 108 (90 BASE) MCG/ACT inhaler Inhale 1-2 puffs into the lungs every 6 (six) hours as needed for wheezing or shortness of breath.   rosuvastatin (CRESTOR) 40 MG tablet TAKE 1 TABLET BY MOUTH EVERY DAY   dicyclomine (BENTYL) 20 MG tablet Take 1 tablet (20 mg total) by mouth 2 (two) times daily.   HYDROcodone-acetaminophen (NORCO) 7.5-325 MG tablet Take 1 tablet by mouth 3 (three) times daily as needed for severe pain.   HYDROcodone-acetaminophen (NORCO/VICODIN) 5-325 MG tablet Take 1 tablet by mouth every 4 (four) hours as needed.   Polyethyl Glycol-Propyl Glycol (SYSTANE) 0.4-0.3 % SOLN Apply 1 drop to eye daily as needed (dry eyes).   No facility-administered encounter medications on file as of 04/17/2023.    Past Surgical History:  Procedure Laterality Date   CESAREAN SECTION     CHOLECYSTECTOMY N/A 08/01/2017   Procedure: LAPAROSCOPIC CHOLECYSTECTOMY;  Surgeon: Manus Rudd, MD;  Location: [facility];  Service: General;  Laterality: N/A;   COLONOSCOPY WITH PROPOFOL N/A 11/21/2020   Procedure: COLONOSCOPY WITH PROPOFOL;  Surgeon:  Rourk, Gerrit Friends, MD;  [facility]: [facility];  Service: Endoscopy;  Laterality: N/A;  12:00pm   CORONARY STENT INTERVENTION N/A 07/31/2021   Procedure: CORONARY STENT INTERVENTION;  Surgeon: Kathleene Hazel, MD;  [facility]: [facility];  Service: Cardiovascular;  Laterality: N/A;   LEFT HEART CATH AND CORONARY ANGIOGRAPHY N/A 07/31/2021   Procedure: LEFT  HEART CATH AND CORONARY ANGIOGRAPHY;  Surgeon: Kathleene Hazel, MD;  [facility]: [facility];  Service: Cardiovascular;  Laterality: N/A;   TUBAL LIGATION      Review of Systems  Constitutional:  Negative for chills and fever.  HENT:  Negative for tinnitus.   Eyes:  Negative for blurred vision.  Respiratory:  Negative for shortness of breath.   Cardiovascular:  Negative for chest pain.  Gastrointestinal:  Negative for abdominal pain.  Genitourinary:  Negative for dysuria.  Musculoskeletal:  Negative for myalgias.  Skin:  Negative for rash.  Neurological:  Negative for dizziness and headaches.      Objective    BP (!) 142/98   Pulse 86   Ht 5\' 7"  (1.702 m)   Wt 276 lb (125.2 kg)   LMP 07/11/2017 (Exact Date)   SpO2 94%   BMI 43.23 kg/m   Physical Exam Vitals reviewed.  Constitutional:      General: She is not in acute distress.    Appearance: Normal appearance. She is not ill-appearing, toxic-appearing or diaphoretic.  HENT:     Head: Normocephalic.  Eyes:     General:        Right eye: No discharge.        Left eye: No discharge.     Conjunctiva/sclera: Conjunctivae normal.  Cardiovascular:     Rate and Rhythm: Normal rate.     Pulses: Normal pulses.     Heart sounds: Normal heart sounds.  Pulmonary:     Effort: Pulmonary effort is normal. No respiratory distress.     Breath sounds: Normal breath sounds.  Abdominal:     General: Bowel sounds are normal.     Palpations: Abdomen is soft.     Tenderness: There is no abdominal tenderness. There is no guarding.  Musculoskeletal:        General: Normal range of motion.     Cervical back: Normal range of motion.  Skin:    General: Skin is warm and dry.     Capillary Refill: Capillary refill takes less than 2 seconds.  Neurological:     General: No focal deficit present.     Mental Status: She is alert and oriented to person, place, and time.     Coordination: Coordination normal.     Gait: Gait  normal.  Psychiatric:        Mood and Affect: Mood normal.        Behavior: Behavior normal.       Assessment & Plan:  Primary hypertension Assessment & Plan: Vitals:   04/17/23 1310 04/17/23 1319  BP: (!) 152/95 (!) 142/98  Patient reported taking metoprolol 25 mg and Lasix 40 mg. Blood pressure not controlled in today's visit, Follow up in 2 weeks with a home blood pressure reading.          Explained non pharmacological interventions such as low salt, DASH diet discussed. Educated on stress reduction and physical activity minimum 150 minutes per week. Discussed signs and symptoms of major cardiovascular event and need to present to the ED.  Patient verbalizes understanding regarding plan of care  and all questions answered.     Screening for diabetes mellitus  Screening for lipid disorders  Screening for thyroid disorder  Vitamin D deficiency  Need for hepatitis C screening test    Return in about 2 weeks (around 05/01/2023) for re-check blood pressure, hypertension, Weight Loss Mangment.   Cruzita Lederer Newman Nip, FNP

## 2023-04-17 NOTE — Assessment & Plan Note (Signed)
Vitals:   04/17/23 1310 04/17/23 1319  BP: (!) 152/95 (!) 142/98  Patient reported taking metoprolol 25 mg and Lasix 40 mg. Blood pressure not controlled in today's visit, Follow up in 2 weeks with a home blood pressure reading.          Explained non pharmacological interventions such as low salt, DASH diet discussed. Educated on stress reduction and physical activity minimum 150 minutes per week. Discussed signs and symptoms of major cardiovascular event and need to present to the ED.  Patient verbalizes understanding regarding plan of care and all questions answered.

## 2023-05-07 ENCOUNTER — Other Ambulatory Visit: Payer: Self-pay | Admitting: Physician Assistant

## 2023-05-08 ENCOUNTER — Encounter: Payer: Self-pay | Admitting: Family Medicine

## 2023-05-08 ENCOUNTER — Ambulatory Visit (INDEPENDENT_AMBULATORY_CARE_PROVIDER_SITE_OTHER): Payer: 59 | Admitting: Family Medicine

## 2023-05-08 VITALS — BP 128/79 | HR 79 | Ht 67.0 in | Wt 280.0 lb

## 2023-05-08 DIAGNOSIS — I10 Essential (primary) hypertension: Secondary | ICD-10-CM | POA: Diagnosis not present

## 2023-05-08 NOTE — Assessment & Plan Note (Signed)
Discussed the importance to start eating 3 meals a day including breakfast, drink 8 glasses of water a day ,reduce portion sizes. reduced carbohydrates limit saturated and trans fat, increase servings of vegetables and limit processed foods. Find an activity that you will enjoy and start to be active at least 5 days a week for 30 minutes each day. Keep a food journal or an activity journal to identify triggers that lead to emotional eating Follow up in 4 weeks for Weight loss management

## 2023-05-08 NOTE — Patient Instructions (Signed)
        Great to see you today.   - Please take medications as prescribed. - Follow up with your primary health provider if any health concerns arises. - If symptoms worsen please contact your primary care provider and/or visit the emergency department.  

## 2023-05-08 NOTE — Assessment & Plan Note (Signed)
Vitals:   05/08/23 1303 05/08/23 1328  BP: 132/81 128/79  Blood pressure controlled in today's visit metoprolol 25 mg and Lasix 40 mg Continued discussion on DASH diet, low sodium diet and maintain a exercise routine for 150 minutes per week.

## 2023-05-08 NOTE — Progress Notes (Signed)
Patient Office Visit   Subjective   Patient ID: Annette Bryant, female    DOB: Dec 11, 1960  Age: 62 y.o. MRN: [id number]  CC:  Chief Complaint  Patient presents with   Follow-up    Follow up discuss weight loss medication    HPI Annette Bryant 62 year old female presents to the clinic for HTN follow up and weight loss management. She  has a past medical history of Anxiety, Arthritis, Asthma, CAD (coronary artery disease), Carpal tunnel syndrome, COPD (chronic obstructive pulmonary disease) (HCC), Depression, Essential hypertension, HFrEF (heart failure with reduced ejection fraction) (HCC), HLD (hyperlipidemia), Hypertension, Ischemic cardiomyopathy, Morbid obesity (HCC), OSA on CPAP, Panic attacks, and Pre-diabetes.For the details of today's visit, please refer to assessment and plan.   HPI    Outpatient Encounter Medications as of 05/08/2023  Medication Sig   albuterol (PROVENTIL) (2.5 MG/3ML) 0.083% nebulizer solution Take 2.5 mg by nebulization every 6 (six) hours as needed for wheezing or shortness of breath.   ALPRAZolam (XANAX) 1 MG tablet Take 1 tablet (1 mg total) by mouth 2 (two) times daily. (Patient taking differently: Take 1 mg by mouth 2 (two) times daily as needed for anxiety.)   aspirin EC 81 MG tablet Take 1 tablet (81 mg total) by mouth daily. Swallow whole.   budesonide-formoterol (SYMBICORT) 160-4.5 MCG/ACT inhaler Inhale 2 puffs into the lungs 2 (two) times daily.   cholecalciferol (VITAMIN D3) 25 MCG (1000 UNIT) tablet Take 1,000 Units by mouth daily.   dicyclomine (BENTYL) 20 MG tablet Take 1 tablet (20 mg total) by mouth 2 (two) times daily.   furosemide (LASIX) 40 MG tablet Take 40 mg by mouth daily as needed for fluid or edema.    KLOR-CON M20 20 MEQ tablet Take 20 mEq by mouth daily as needed (when taking furosemide).   metoprolol tartrate (LOPRESSOR) 25 MG tablet TAKE 1 TABLET 2 TIMES DAILY.   Multiple Vitamin (MULTIVITAMIN WITH MINERALS) TABS tablet Take 1  tablet by mouth daily.   nitroGLYCERIN (NITROSTAT) 0.4 MG SL tablet PLACE 1 TABLET UNDER THE TONGUE EVERY 5 (FIVE) MINUTES X 3 DOSES AS NEEDED FOR CHEST PAIN.   Polyethyl Glycol-Propyl Glycol (SYSTANE) 0.4-0.3 % SOLN Apply 1 drop to eye daily as needed (dry eyes).   Potassium Chloride ER 20 MEQ TBCR Take 1 tablet by mouth daily.   PROAIR HFA 108 (90 BASE) MCG/ACT inhaler Inhale 1-2 puffs into the lungs every 6 (six) hours as needed for wheezing or shortness of breath.   rosuvastatin (CRESTOR) 40 MG tablet TAKE 1 TABLET BY MOUTH EVERY DAY   [DISCONTINUED] MOUNJARO 10 MG/0.5ML Pen Inject into the skin once a week.   [DISCONTINUED] HYDROcodone-acetaminophen (NORCO) 7.5-325 MG tablet Take 1 tablet by mouth 3 (three) times daily as needed for severe pain.   [DISCONTINUED] HYDROcodone-acetaminophen (NORCO/VICODIN) 5-325 MG tablet Take 1 tablet by mouth every 4 (four) hours as needed.   No facility-administered encounter medications on file as of 05/08/2023.    Past Surgical History:  Procedure Laterality Date   CESAREAN SECTION     CHOLECYSTECTOMY N/A 08/01/2017   Procedure: LAPAROSCOPIC CHOLECYSTECTOMY;  Surgeon: Manus Rudd, MD;  Location: [facility];  Service: General;  Laterality: N/A;   COLONOSCOPY WITH PROPOFOL N/A 11/21/2020   Procedure: COLONOSCOPY WITH PROPOFOL;  Surgeon: Corbin Ade, MD;  [facility];  Service: Endoscopy;  Laterality: N/A;  12:00pm   CORONARY STENT INTERVENTION N/A 07/31/2021   Procedure: CORONARY STENT INTERVENTION;  Surgeon: Verne Carrow  D, MD;  [facility];  Service: Cardiovascular;  Laterality: N/A;   LEFT HEART CATH AND CORONARY ANGIOGRAPHY N/A 07/31/2021   Procedure: LEFT HEART CATH AND CORONARY ANGIOGRAPHY;  Surgeon: Kathleene Hazel, MD;  [facility];  Service: Cardiovascular;  Laterality: N/A;   TUBAL LIGATION      Review of Systems  Constitutional:  Negative for chills and fever.  Eyes:  Negative  for blurred vision.  Respiratory:  Negative for shortness of breath.   Cardiovascular:  Negative for chest pain.  Gastrointestinal:  Negative for nausea and vomiting.  Genitourinary:  Negative for dysuria.  Musculoskeletal:  Negative for myalgias.  Neurological:  Negative for dizziness and headaches.      Objective    BP 128/79   Pulse 79   Ht 5\' 7"  (1.702 m)   Wt 280 lb (127 kg)   LMP 07/11/2017 (Exact Date)   SpO2 94%   BMI 43.85 kg/m   Physical Exam Vitals reviewed.  Constitutional:      General: She is not in acute distress.    Appearance: Normal appearance. She is not ill-appearing, toxic-appearing or diaphoretic.  HENT:     Head: Normocephalic.  Eyes:     General:        Right eye: No discharge.        Left eye: No discharge.     Conjunctiva/sclera: Conjunctivae normal.  Cardiovascular:     Rate and Rhythm: Normal rate.     Pulses: Normal pulses.     Heart sounds: Normal heart sounds.  Pulmonary:     Effort: Pulmonary effort is normal. No respiratory distress.     Breath sounds: Normal breath sounds.  Abdominal:     General: Bowel sounds are normal.     Palpations: Abdomen is soft.     Tenderness: There is no abdominal tenderness. There is no guarding.  Musculoskeletal:        General: Normal range of motion.     Cervical back: Normal range of motion.  Skin:    General: Skin is warm and dry.     Capillary Refill: Capillary refill takes less than 2 seconds.  Neurological:     General: No focal deficit present.     Mental Status: She is alert and oriented to person, place, and time.     Coordination: Coordination normal.     Gait: Gait normal.  Psychiatric:        Mood and Affect: Mood normal.        Behavior: Behavior normal.       Assessment & Plan:  Primary hypertension Assessment & Plan: Vitals:   05/08/23 1303 05/08/23 1328  BP: 132/81 128/79  Blood pressure controlled in today's visit metoprolol 25 mg and Lasix 40 mg Continued discussion on  DASH diet, low sodium diet and maintain a exercise routine for 150 minutes per week.    Morbid obesity (HCC) Assessment & Plan: Discussed the importance to start eating 3 meals a day including breakfast, drink 8 glasses of water a day ,reduce portion sizes. reduced carbohydrates limit saturated and trans fat, increase servings of vegetables and limit processed foods. Find an activity that you will enjoy and start to be active at least 5 days a week for 30 minutes each day. Keep a food journal or an activity journal to identify triggers that lead to emotional eating Follow up in 4 weeks for Weight loss management       Return  in about 4 months (around 09/08/2023) for chronic follow-up, routine labs, hypertension, hyperlipidemia/ high cholestrol.   Cruzita Lederer Newman Nip, FNP

## 2023-06-17 ENCOUNTER — Telehealth: Payer: Self-pay | Admitting: Family Medicine

## 2023-06-17 NOTE — Telephone Encounter (Signed)
Needs refill on Monjaro 10mg 

## 2023-06-18 ENCOUNTER — Other Ambulatory Visit: Payer: Self-pay | Admitting: Family Medicine

## 2023-06-18 MED ORDER — TIRZEPATIDE 2.5 MG/0.5ML ~~LOC~~ SOAJ: 2.5000 mg | SUBCUTANEOUS | 0 refills | Status: DC

## 2023-06-26 ENCOUNTER — Ambulatory Visit (INDEPENDENT_AMBULATORY_CARE_PROVIDER_SITE_OTHER): Payer: 59 | Admitting: Orthopaedic Surgery

## 2023-06-26 ENCOUNTER — Encounter: Payer: Self-pay | Admitting: Orthopaedic Surgery

## 2023-06-26 DIAGNOSIS — M25562 Pain in left knee: Secondary | ICD-10-CM

## 2023-06-26 DIAGNOSIS — G8929 Other chronic pain: Secondary | ICD-10-CM

## 2023-06-26 MED ORDER — METHYLPREDNISOLONE ACETATE 40 MG/ML IJ SUSP
40.0000 mg | Freq: Once | INTRAMUSCULAR | Status: AC
  Administered 2023-06-26: 40 mg via INTRA_ARTICULAR

## 2023-06-26 NOTE — Progress Notes (Signed)
PROCEDURE NOTE:  The patient requests injections of the left knee , verbal consent was obtained.  The left knee was prepped appropriately after time out was performed.   Sterile technique was observed and injection of 1 cc of DepoMedrol 40 mg with several cc's of plain xylocaine. Anesthesia was provided by ethyl chloride and a 20-gauge needle was used to inject the knee area. The injection was tolerated well.  A band aid dressing was applied.  The patient was advised to apply ice later today and tomorrow to the injection sight as needed.  Encounter Diagnosis  Name Primary?   Chronic pain of left knee Yes   Return prn  Call if any problem.  Precautions discussed.  Electronically Signed Darreld Mclean, MD 7/17/20248:23 AM

## 2023-06-26 NOTE — Addendum Note (Signed)
Addended byCaffie Damme on: 06/26/2023 08:24 AM   Modules accepted: Orders

## 2023-07-01 ENCOUNTER — Telehealth: Payer: Self-pay | Admitting: Family Medicine

## 2023-07-01 NOTE — Telephone Encounter (Signed)
Prescription Request  07/01/2023  LOV: 05/08/2023  What is the name of the medication or equipment? tirzepatide [facility]) 2.5 MG/0.5ML Pen   Pt states she needs to be on the 10MG  dose  Have you contacted your pharmacy to request a refill? No   Which pharmacy would you like this sent to?    [id number] - [place], [place] - [address] [place] [address] Phone: [phone] Fax: [phone]    Patient notified that their request is being sent to the clinical staff for review and that they should receive a response within 2 business days.   Please advise at [facility] [phone]

## 2023-07-01 NOTE — Telephone Encounter (Signed)
Please let patient know we need to increase dosage monthly

## 2023-07-01 NOTE — Telephone Encounter (Signed)
Called patient to let her know the dose gets increased monthly at 2.5mg s. No answer and no VM.

## 2023-07-12 ENCOUNTER — Other Ambulatory Visit: Payer: Self-pay | Admitting: Family Medicine

## 2023-07-12 MED ORDER — TIRZEPATIDE 5 MG/0.5ML ~~LOC~~ SOAJ: 5.0000 mg | SUBCUTANEOUS | 0 refills | Status: DC

## 2023-07-16 ENCOUNTER — Telehealth: Payer: Self-pay | Admitting: Family Medicine

## 2023-07-16 NOTE — Telephone Encounter (Signed)
Patient calling says there was 5mg  called in for her Annette Bryant but it was supposed to be 10 mg. Please advise, thank you

## 2023-07-17 NOTE — Telephone Encounter (Signed)
I explained to my patient previously we need to increase dosage monthly. Current month dosage 5 mg  Next month dosage 7.5 mg next month after 10 mg

## 2023-07-29 ENCOUNTER — Other Ambulatory Visit: Payer: Self-pay | Admitting: Cardiology

## 2023-08-09 ENCOUNTER — Other Ambulatory Visit: Payer: Self-pay | Admitting: Family Medicine

## 2023-08-12 ENCOUNTER — Other Ambulatory Visit: Payer: Self-pay | Admitting: Family Medicine

## 2023-08-20 ENCOUNTER — Telehealth: Payer: Self-pay | Admitting: Family Medicine

## 2023-08-20 ENCOUNTER — Other Ambulatory Visit: Payer: Self-pay | Admitting: Family Medicine

## 2023-08-20 NOTE — Telephone Encounter (Signed)
[facility] called said [facility] out of [facility] medicine Hydrocodone and asked to send to [facility], patient asking for a call back once has been [facility].

## 2023-08-20 NOTE — Telephone Encounter (Signed)
Prescription Request  08/20/2023  LOV: 05/08/2023  What is the name of the medication or equipment? HYDROcodone-acetaminophen (NORCO) 7.5-325 MG per tablet [[id number]]  DISCONTINUED    Have you contacted your pharmacy to request a refill? Yes   Which pharmacy would you like this sent to?   [id number] - [place], [place] - [address] [place] [address] Phone: [phone] Fax: [phone]    Patient notified that their request is being sent to the clinical staff for review and that they should receive a response within 2 business days.   Please advise at Mobile [phone] (mobile)

## 2023-08-20 NOTE — Telephone Encounter (Signed)
It appears that Dr. John Giovanni is managing the patient's medication. Please instruct the patient to follow up with Dr. Sudie Bailey for medication refills.

## 2023-08-21 ENCOUNTER — Other Ambulatory Visit: Payer: Self-pay | Admitting: Family Medicine

## 2023-08-21 DIAGNOSIS — M549 Dorsalgia, unspecified: Secondary | ICD-10-CM

## 2023-08-21 MED ORDER — OXYCODONE-ACETAMINOPHEN 7.5-325 MG PO TABS
1.0000 | ORAL_TABLET | Freq: Four times a day (QID) | ORAL | 0 refills | Status: DC | PRN
Start: 2023-08-21 — End: 2023-08-21

## 2023-08-21 MED ORDER — OXYCODONE-ACETAMINOPHEN 7.5-325 MG PO TABS
1.0000 | ORAL_TABLET | Freq: Four times a day (QID) | ORAL | 0 refills | Status: AC | PRN
Start: 2023-08-21 — End: ?

## 2023-08-21 NOTE — Telephone Encounter (Signed)
[facility]   Previous tele messages below

## 2023-08-21 NOTE — Telephone Encounter (Signed)
Rx sent  to [facility]

## 2023-08-22 ENCOUNTER — Telehealth: Payer: Self-pay | Admitting: Family Medicine

## 2023-08-22 NOTE — Telephone Encounter (Signed)
Prescription Request  08/22/2023  LOV: Visit date not found  What is the name of the medication or equipment? Hydrocodone 7.5 mg   Have you contacted your pharmacy to request a refill? Yes   Which pharmacy would you like this sent to?  [facility] [address] [place]  Patient notified that their request is being sent to the clinical staff for review and that they should receive a response within 2 business days.   Please advise at Mobile [phone] (mobile)

## 2023-08-22 NOTE — Telephone Encounter (Signed)
Pt called in regard to previous tele message , pharm has not received refill request

## 2023-08-30 ENCOUNTER — Telehealth: Payer: Self-pay | Admitting: Family Medicine

## 2023-08-30 ENCOUNTER — Other Ambulatory Visit: Payer: Self-pay

## 2023-08-30 MED ORDER — MOUNJARO 5 MG/0.5ML ~~LOC~~ SOAJ: 5.0000 mg | SUBCUTANEOUS | 0 refills | Status: DC

## 2023-08-30 NOTE — Telephone Encounter (Signed)
Prescription Request  08/30/2023  LOV: 05/08/2023  What is the name of the medication or equipment? tirzepatide [facility]) 5 MG/0.5ML Pen   Pt states that med should be increased to 12mg   Have you contacted your pharmacy to request a refill? Yes   Which pharmacy would you like this sent to?  [id number] - [place], [place] - [address] AT [facility] [address] [place] [address] Phone: [phone] Fax: [phone]      Patient notified that their request is being sent to the clinical staff for review and that they should receive a response within 2 business days.   Please advise at [facility] [phone]

## 2023-08-30 NOTE — Telephone Encounter (Signed)
Refilled

## 2023-09-03 NOTE — Telephone Encounter (Signed)
Pt called in stating that medication below should have been sent in as 12mg  instead of 5mg   as stated in previous tele message   Patient is also requesting provider change to [facility] due to too many issues with prescription refills

## 2023-09-03 NOTE — Telephone Encounter (Signed)
That's fine with me she can change to Dr. Durwin Nora

## 2023-09-04 NOTE — Telephone Encounter (Signed)
Pt called back in regard to previous tele messages, same incorrect medication was sent pharm .  Patient is requesting 12mg .  Pt needs a call back in regard

## 2023-09-05 ENCOUNTER — Other Ambulatory Visit: Payer: Self-pay | Admitting: Family Medicine

## 2023-09-05 MED ORDER — TIRZEPATIDE 7.5 MG/0.5ML ~~LOC~~ SOAJ: 7.5000 mg | SUBCUTANEOUS | 0 refills | Status: DC

## 2023-09-05 NOTE — Telephone Encounter (Signed)
I explained to my patient previously we need to increase dosage monthly. Current month dosage 7.5 mg  Next month dosage 10  mg next month after 12 mg

## 2023-09-05 NOTE — Telephone Encounter (Signed)
Patient called said she should be on 12 mg, patient has been on 10 mg for so long now. Patient asking for a call back to clarify [phone]

## 2023-09-05 NOTE — Telephone Encounter (Signed)
recieved

## 2023-09-05 NOTE — Telephone Encounter (Signed)
Past dispense history

## 2023-09-09 ENCOUNTER — Ambulatory Visit (INDEPENDENT_AMBULATORY_CARE_PROVIDER_SITE_OTHER): Payer: 59 | Admitting: Family Medicine

## 2023-09-09 VITALS — BP 112/73 | HR 83 | Resp 16 | Ht 67.0 in | Wt 262.0 lb

## 2023-09-09 DIAGNOSIS — G894 Chronic pain syndrome: Secondary | ICD-10-CM

## 2023-09-09 DIAGNOSIS — E782 Mixed hyperlipidemia: Secondary | ICD-10-CM

## 2023-09-09 DIAGNOSIS — E559 Vitamin D deficiency, unspecified: Secondary | ICD-10-CM | POA: Diagnosis not present

## 2023-09-09 DIAGNOSIS — Z1329 Encounter for screening for other suspected endocrine disorder: Secondary | ICD-10-CM

## 2023-09-09 DIAGNOSIS — Z7985 Long-term (current) use of injectable non-insulin antidiabetic drugs: Secondary | ICD-10-CM

## 2023-09-09 DIAGNOSIS — Z1159 Encounter for screening for other viral diseases: Secondary | ICD-10-CM

## 2023-09-09 DIAGNOSIS — I10 Essential (primary) hypertension: Secondary | ICD-10-CM | POA: Diagnosis not present

## 2023-09-09 DIAGNOSIS — E119 Type 2 diabetes mellitus without complications: Secondary | ICD-10-CM | POA: Diagnosis not present

## 2023-09-09 MED ORDER — HYDROCODONE-ACETAMINOPHEN 7.5-325 MG PO TABS: 1.0000 | ORAL_TABLET | Freq: Two times a day (BID) | ORAL | 0 refills | Status: DC | PRN

## 2023-09-09 MED ORDER — TIRZEPATIDE 12.5 MG/0.5ML ~~LOC~~ SOAJ
12.5000 mg | SUBCUTANEOUS | 0 refills | Status: DC
Start: 2023-09-09 — End: 2023-09-12

## 2023-09-09 NOTE — Patient Instructions (Signed)

## 2023-09-09 NOTE — Progress Notes (Signed)
Patient Office Visit   Subjective   Patient ID: Annette Bryant, female    DOB: Dec 02, 1961  Age: 62 y.o. MRN: [id number]  CC:  Chief Complaint  Patient presents with   Hypertension   Diabetes    States she was supposed to have an increase in the [facility] and the pharmacist told her she was supposed to be at 12mg  by now.    Medication Refill    States she never got the hydrocodone refill for this month.    HPI Annette Bryant 62 year old female,presents to the clinic for chronic follow up. She  has a past medical history of Anxiety, Arthritis, Asthma, CAD (coronary artery disease), Carpal tunnel syndrome, COPD (chronic obstructive pulmonary disease) (HCC), Depression, Essential hypertension, HFrEF (heart failure with reduced ejection fraction) (HCC), HLD (hyperlipidemia), Hypertension, Ischemic cardiomyopathy, Morbid obesity (HCC), OSA on CPAP, Panic attacks, and Pre-diabetes.  Diabetes She presents for her follow-up diabetic visit. She has type 2 diabetes mellitus. Pertinent negatives for hypoglycemia include no dizziness or headaches. Pertinent negatives for diabetes include no blurred vision, no chest pain, no polydipsia, no polyphagia and no polyuria. Pertinent negatives for hypoglycemia complications include no blackouts. Symptoms are stable. Diabetic complications include peripheral neuropathy. Risk factors for coronary artery disease include hypertension, diabetes mellitus and dyslipidemia. Current diabetic treatment includes diet (mounjaro injection). She is compliant with treatment all of the time. She is following a generally healthy diet. She has not had a previous visit with a dietitian. She participates in exercise three times a week. She does not see a podiatrist.Eye exam is not current.      Outpatient Encounter Medications as of 09/09/2023  Medication Sig   albuterol (PROVENTIL) (2.5 MG/3ML) 0.083% nebulizer solution Take 2.5 mg by nebulization every 6 (six) hours as needed  for wheezing or shortness of breath.   ALPRAZolam (XANAX) 1 MG tablet Take 1 tablet (1 mg total) by mouth 2 (two) times daily. (Patient taking differently: Take 1 mg by mouth 2 (two) times daily as needed for anxiety.)   aspirin EC 81 MG tablet Take 1 tablet (81 mg total) by mouth daily. Swallow whole.   budesonide-formoterol (SYMBICORT) 160-4.5 MCG/ACT inhaler Inhale 2 puffs into the lungs 2 (two) times daily.   cholecalciferol (VITAMIN D3) 25 MCG (1000 UNIT) tablet Take 1,000 Units by mouth daily.   dicyclomine (BENTYL) 20 MG tablet Take 1 tablet (20 mg total) by mouth 2 (two) times daily.   furosemide (LASIX) 40 MG tablet Take 40 mg by mouth daily as needed for fluid or edema.    KLOR-CON M20 20 MEQ tablet Take 20 mEq by mouth daily as needed (when taking furosemide).   metoprolol tartrate (LOPRESSOR) 25 MG tablet TAKE 1 TABLET 2 TIMES DAILY.   Multiple Vitamin (MULTIVITAMIN WITH MINERALS) TABS tablet Take 1 tablet by mouth daily.   nitroGLYCERIN (NITROSTAT) 0.4 MG SL tablet PLACE 1 TABLET UNDER THE TONGUE EVERY 5 (FIVE) MINUTES X 3 DOSES AS NEEDED FOR CHEST PAIN.   Polyethyl Glycol-Propyl Glycol (SYSTANE) 0.4-0.3 % SOLN Apply 1 drop to eye daily as needed (dry eyes).   Potassium Chloride ER 20 MEQ TBCR Take 1 tablet by mouth daily.   PROAIR HFA 108 (90 BASE) MCG/ACT inhaler Inhale 1-2 puffs into the lungs every 6 (six) hours as needed for wheezing or shortness of breath.   rosuvastatin (CRESTOR) 40 MG tablet TAKE 1 TABLET BY MOUTH EVERY DAY   tirzepatide (MOUNJARO) 12.5 MG/0.5ML Pen Inject 12.5 mg into  the skin once a week.   [DISCONTINUED] HYDROcodone-acetaminophen (NORCO) 7.5-325 MG tablet Three times daily   [DISCONTINUED] oxyCODONE-acetaminophen (PERCOCET) 7.5-325 MG tablet Take 1 tablet by mouth every 6 (six) hours as needed for severe pain.   [DISCONTINUED] tirzepatide (MOUNJARO) 7.5 MG/0.5ML Pen Inject 7.5 mg into the skin once a week.   HYDROcodone-acetaminophen (NORCO) 7.5-325 MG  tablet Take 1 tablet by mouth 2 (two) times daily as needed for moderate pain. Three times daily   No facility-administered encounter medications on file as of 09/09/2023.    Past Surgical History:  Procedure Laterality Date   CESAREAN SECTION     CHOLECYSTECTOMY N/A 08/01/2017   Procedure: LAPAROSCOPIC CHOLECYSTECTOMY;  Surgeon: Manus Rudd, MD;  Location: [facility];  Service: General;  Laterality: N/A;   COLONOSCOPY WITH PROPOFOL N/A 11/21/2020   Procedure: COLONOSCOPY WITH PROPOFOL;  Surgeon: Corbin Ade, MD;  Location: [facility];  Service: Endoscopy;  Laterality: N/A;  12:00pm   CORONARY STENT INTERVENTION N/A 07/31/2021   Procedure: CORONARY STENT INTERVENTION;  Surgeon: Kathleene Hazel, MD;  Location: [facility] INV[facility]IVE CV [facility];  Service: Cardiovascular;  Laterality: N/A;   LEFT HEART CATH AND CORONARY ANGIOGRAPHY N/A 07/31/2021   Procedure: LEFT HEART CATH AND CORONARY ANGIOGRAPHY;  Surgeon: Kathleene Hazel, MD;  Location: [facility] CV [facility];  Service: Cardiovascular;  Laterality: N/A;   TUBAL LIGATION      Review of Systems  Eyes:  Negative for blurred vision.  Cardiovascular:  Negative for chest pain.  Neurological:  Negative for dizziness and headaches.  Endo/Heme/Allergies:  Negative for polydipsia and polyphagia.      Objective    LMP 07/11/2017 (Exact Date)   Physical Exam Vitals reviewed.  Constitutional:      General: She is not in acute distress.    Appearance: Normal appearance. She is not ill-appearing, toxic-appearing or diaphoretic.  HENT:     Head: Normocephalic.  Eyes:     General:        Right eye: No discharge.        Left eye: No discharge.     Conjunctiva/sclera: Conjunctivae normal.  Cardiovascular:     Rate and Rhythm: Normal rate.     Pulses: Normal pulses.     Heart sounds: Normal heart sounds.  Pulmonary:     Effort: Pulmonary effort is normal. No respiratory distress.     Breath sounds: Normal breath sounds.   Musculoskeletal:        General: Normal range of motion.     Cervical back: Normal range of motion.  Skin:    General: Skin is warm and dry.     Capillary Refill: Capillary refill takes less than 2 seconds.  Neurological:     Mental Status: She is alert.     Coordination: Coordination normal.     Gait: Gait normal.  Psychiatric:        Mood and Affect: Mood normal.        Behavior: Behavior normal.       Assessment & Plan:  Type 2 diabetes mellitus without complication, without long-term current use of insulin (HCC) Assessment & Plan: Labs ordered today awaiting results will follow up. Patient reports taking mounjaro 10 mg once a week.       Discussed medication desired effects, potential side effects, and how to administer the medication. Nonpharmacological interventions such as low carb diet,high in protein, vegetables and fruit discussed. Educated on importance of physical activity 150 minutes per week. Discussed signs and  symptoms of hypoglycemia, & hyperglycemia and need to present to the ED if symptoms occurs.Follow up in 3 months or sooner if needed. Patient verbalizes understanding regarding plan of care and all questions answered. Ophthalmology referral placed , Foot exam within desired limits   Orders: -     Tirzepatide; Inject 12.5 mg into the skin once a week.  Dispense: 2 mL; Refill: 0 -     Microalbumin / creatinine urine ratio  Chronic pain syndrome -     Ambulatory referral to Pain Clinic  Primary hypertension Assessment & Plan: Continue Metoprolol 25 mg once daily, Labs ordered in today's visit Continued discussion on DASH diet, low sodium diet and maintain a exercise routine for 150 minutes per week.   Orders: -     Hemoglobin A1c -     CMP14+EGFR -     CBC with Differential/Platelet  Need for hepatitis C screening test -     Hepatitis C antibody  Vitamin D deficiency -     VITAMIN D 25 Hydroxy (Vit-D Deficiency, Fractures)  Screening for thyroid  disorder -     TSH + free T4  Mixed hyperlipidemia -     Lipid panel  Diabetic eye exam (HCC) -     Ambulatory referral to Ophthalmology  Other orders -     HYDROcodone-Acetaminophen; Take 1 tablet by mouth 2 (two) times daily as needed for moderate pain. Three times daily  Dispense: 90 tablet; Refill: 0    Return in about 4 months (around 01/09/2024), or if symptoms worsen or fail to improve, for type 2 diabetes, hypertension, hyperlipidemia.   Cruzita Lederer Newman Nip, FNP

## 2023-09-09 NOTE — Assessment & Plan Note (Signed)
Continue Metoprolol 25 mg once daily, Labs ordered in today's visit Continued discussion on DASH diet, low sodium diet and maintain a exercise routine for 150 minutes per week.

## 2023-09-09 NOTE — Assessment & Plan Note (Signed)
Labs ordered today awaiting results will follow up. Patient reports taking mounjaro 10 mg once a week.       Discussed medication desired effects, potential side effects, and how to administer the medication. Nonpharmacological interventions such as low carb diet,high in protein, vegetables and fruit discussed. Educated on importance of physical activity 150 minutes per week. Discussed signs and symptoms of hypoglycemia, & hyperglycemia and need to present to the ED if symptoms occurs.Follow up in 3 months or sooner if needed. Patient verbalizes understanding regarding plan of care and all questions answered. Ophthalmology referral placed , Foot exam within desired limits

## 2023-09-11 ENCOUNTER — Telehealth: Payer: Self-pay | Admitting: Family Medicine

## 2023-09-11 ENCOUNTER — Other Ambulatory Visit: Payer: Self-pay | Admitting: Family Medicine

## 2023-09-11 MED ORDER — HYDROCODONE-ACETAMINOPHEN 7.5-325 MG PO TABS: 1.0000 | ORAL_TABLET | Freq: Three times a day (TID) | ORAL | 0 refills | Status: DC | PRN

## 2023-09-11 NOTE — Telephone Encounter (Signed)
Patient called said need clarification on medication prior authorization. Been trying to do since Monday.  HYDROcodone-acetaminophen (NORCO) 7.5-325 MG tablet [[id number]]   pharmacy: [facility]

## 2023-09-11 NOTE — Telephone Encounter (Signed)
Fixed.

## 2023-09-11 NOTE — Telephone Encounter (Signed)
Patient returning call and she handed her phone to the pharmacist, he said the script received :  HYDROcodone-acetaminophen (NORCO) 7.5-325 MG tablet [[id number]]   1 tablet bid and also has  1 tablet  tid   Asking for a new script with the correct directions how to take daily.  [facility]: [facility] [place]

## 2023-09-11 NOTE — Telephone Encounter (Signed)
They need the script resent with the correct instructions. The recent script sent says two different directions.

## 2023-09-11 NOTE — Telephone Encounter (Signed)
Prescription Request  09/11/2023  LOV: 09/09/2023  What is the name of the medication or equipment? tirzepatide [facility]) 12.5 MG/0.5ML Pen [[id number]]   Have you contacted your pharmacy to request a refill? Yes   Which pharmacy would you like this sent to?  [facility] [place]   Patient notified that their request is being sent to the clinical staff for review and that they should receive a response within 2 business days.   Please advise at Mobile [phone] (mobile)

## 2023-09-11 NOTE — Telephone Encounter (Signed)
Refill sent on 9/30.

## 2023-09-11 NOTE — Telephone Encounter (Signed)
Called patient, left VM.

## 2023-09-12 ENCOUNTER — Other Ambulatory Visit: Payer: Self-pay

## 2023-09-12 DIAGNOSIS — E119 Type 2 diabetes mellitus without complications: Secondary | ICD-10-CM

## 2023-09-12 MED ORDER — TIRZEPATIDE 12.5 MG/0.5ML ~~LOC~~ SOAJ
12.5000 mg | SUBCUTANEOUS | 0 refills | Status: DC
Start: 2023-09-12 — End: 2023-10-08

## 2023-09-12 NOTE — Telephone Encounter (Signed)
Patient has spoken with pharm and Incorrect med dosage was sent in   Pt needs tirzepatide [facility]) 12.5 MG/0.5ML Pen [[id number]]   Not 7.5 that was sent in.   Patient needs correct medication dosage sent to pharm.

## 2023-09-13 LAB — CBC WITH DIFFERENTIAL/PLATELET
Basophils Absolute: 0.1 10*3/uL (ref 0.0–0.2)
Basos: 1 %
EOS (ABSOLUTE): 0.5 10*3/uL — ABNORMAL HIGH (ref 0.0–0.4)
Eos: 6 %
Hematocrit: 44.7 % (ref 34.0–46.6)
Hemoglobin: 14.2 g/dL (ref 11.1–15.9)
Immature Grans (Abs): 0 10*3/uL (ref 0.0–0.1)
Immature Granulocytes: 0 %
Lymphocytes Absolute: 3.1 10*3/uL (ref 0.7–3.1)
Lymphs: 40 %
MCH: 27 pg (ref 26.6–33.0)
MCHC: 31.8 g/dL (ref 31.5–35.7)
MCV: 85 fL (ref 79–97)
Monocytes Absolute: 0.5 10*3/uL (ref 0.1–0.9)
Monocytes: 6 %
Neutrophils Absolute: 3.6 10*3/uL (ref 1.4–7.0)
Neutrophils: 47 %
Platelets: 256 10*3/uL (ref 150–450)
RBC: 5.25 x10E6/uL (ref 3.77–5.28)
RDW: 14.5 % (ref 11.7–15.4)
WBC: 7.7 10*3/uL (ref 3.4–10.8)

## 2023-09-13 LAB — LIPID PANEL
Chol/HDL Ratio: 3.6 {ratio} (ref 0.0–4.4)
Cholesterol, Total: 130 mg/dL (ref 100–199)
HDL: 36 mg/dL — ABNORMAL LOW (ref >39)
LDL Chol Calc (NIH): 77 mg/dL (ref 0–99)
Triglycerides: 86 mg/dL (ref 0–149)
VLDL Cholesterol Cal: 17 mg/dL (ref 5–40)

## 2023-09-13 LAB — CMP14+EGFR
ALT: 10 [IU]/L (ref 0–32)
AST: 14 [IU]/L (ref 0–40)
Albumin: 4.1 g/dL (ref 3.8–4.9)
Alkaline Phosphatase: 131 [IU]/L — ABNORMAL HIGH (ref 44–121)
BUN/Creatinine Ratio: 10 — ABNORMAL LOW (ref 12–28)
BUN: 10 mg/dL (ref 8–27)
Bilirubin Total: 0.5 mg/dL (ref 0.0–1.2)
CO2: 25 mmol/L (ref 20–29)
Calcium: 9.8 mg/dL (ref 8.7–10.3)
Chloride: 102 mmol/L (ref 96–106)
Creatinine, Ser: 1.03 mg/dL — ABNORMAL HIGH (ref 0.57–1.00)
Globulin, Total: 3.2 g/dL (ref 1.5–4.5)
Glucose: 85 mg/dL (ref 70–99)
Potassium: 4.3 mmol/L (ref 3.5–5.2)
Sodium: 140 mmol/L (ref 134–144)
Total Protein: 7.3 g/dL (ref 6.0–8.5)
eGFR: 62 mL/min/{1.73_m2} (ref >59)

## 2023-09-13 LAB — VITAMIN D 25 HYDROXY (VIT D DEFICIENCY, FRACTURES): Vit D, 25-Hydroxy: 36.5 ng/mL (ref 30.0–100.0)

## 2023-09-13 LAB — TSH+FREE T4
Free T4: 1.29 ng/dL (ref 0.82–1.77)
TSH: 0.777 u[IU]/mL (ref 0.450–4.500)

## 2023-09-13 LAB — HEMOGLOBIN A1C
Est. average glucose Bld gHb Est-mCnc: 123 mg/dL
Hgb A1c MFr Bld: 5.9 % — ABNORMAL HIGH (ref 4.8–5.6)

## 2023-09-13 LAB — HEPATITIS C ANTIBODY: Hep C Virus Ab: NONREACTIVE

## 2023-09-16 NOTE — Progress Notes (Signed)
Please inform patient,  Hemoglobin A1c 5.9, continue Mounjaro Injections once a week  It is important to follow a DASH diet which includes vegetables,fruits,whole grains, fat free or low fat diary,fish,poultry,beans,nuts and seeds,vegetable oils. Find an activity that you will enjoyandstart to be active at least 5 days a week for 30 minutes each day.   Repeat labs in our next visit

## 2023-10-02 ENCOUNTER — Telehealth: Payer: Self-pay | Admitting: Family Medicine

## 2023-10-02 NOTE — Telephone Encounter (Signed)
Spoke with patient to schedule AWV  She asked that someone call in  tirzepatide [facility]) 12.5 MG/0.5ML Pen Medication Date: 09/12/2023 Department: [facility] Ordering/Authorizing: Rica Records, FNP   Order Providers  Prescribing Provider Encounter Provider  Del Newman Nip, [place], FNP Marlana Salvage, CMA   Outpatient Medication Detail   Disp Refills Start End   tirzepatide [facility]) 12.5 MG/0.5ML Pen 2 mL 0 09/12/2023 --   Sig - Route: Inject 12.5 mg into the skin once a week. - Subcutaneous   Sent to pharmacy as: tirzepatide [facility]) 12.5 MG/0.5ML Pen   E-Prescribing Status: Receipt confirmed by pharmacy (09/12/2023  1:34 PM EDT)    Associated Diagnoses  Type 2 diabetes mellitus without complication, without long-term current use of insulin (HCC)     [id number] [place], [place] - [address] AT [facility]   Additional Information  Associated Reports  View Encounter  Priority and Order Details    Thank you,  Judeth Cornfield,  AMB Clinical Support [facility] AWV Program Direct Dial ??[phone]

## 2023-10-08 ENCOUNTER — Other Ambulatory Visit: Payer: Self-pay

## 2023-10-08 ENCOUNTER — Telehealth: Payer: Self-pay | Admitting: Family Medicine

## 2023-10-08 DIAGNOSIS — E119 Type 2 diabetes mellitus without complications: Secondary | ICD-10-CM

## 2023-10-08 MED ORDER — TIRZEPATIDE 12.5 MG/0.5ML ~~LOC~~ SOAJ
12.5000 mg | SUBCUTANEOUS | 0 refills | Status: DC
Start: 2023-10-08 — End: 2023-10-30

## 2023-10-08 NOTE — Telephone Encounter (Signed)
Refill sent.

## 2023-10-08 NOTE — Telephone Encounter (Signed)
Prescription Request  10/08/2023  LOV: 09/09/2023  What is the name of the medication or equipment? tirzepatide [facility]) 12.5 MG/0.5ML Pen   Have you contacted your pharmacy to request a refill? Yes   Which pharmacy would you like this sent to?  [facility] [place]   Patient notified that their request is being sent to the clinical staff for review and that they should receive a response within 2 business days.   Please advise at Mobile [phone] (mobile)

## 2023-10-12 ENCOUNTER — Other Ambulatory Visit: Payer: Self-pay | Admitting: Family Medicine

## 2023-10-14 ENCOUNTER — Telehealth: Payer: Self-pay | Admitting: Family Medicine

## 2023-10-14 NOTE — Telephone Encounter (Signed)
Prescription Request  10/14/2023  LOV: 09/09/2023  What is the name of the medication or equipment? oxyCODONE-acetaminophen (PERCOCET) 7.5-325 MG tablet [[id number]]  DISCONTINUED    Have you contacted your pharmacy to request a refill? No   Which pharmacy would you like this sent to?  [id number] [place], [place] - [address] AT [facility] [address]  [place] [address] Phone: [phone] Fax: [phone]    Patient notified that their request is being sent to the clinical staff for review and that they should receive a response within 2 business days.   Please advise at Mobile [phone] (mobile)

## 2023-10-15 NOTE — Telephone Encounter (Signed)
Ask patient about pain management referral appointment?

## 2023-10-16 ENCOUNTER — Other Ambulatory Visit: Payer: Self-pay | Admitting: Family Medicine

## 2023-10-16 DIAGNOSIS — G894 Chronic pain syndrome: Secondary | ICD-10-CM

## 2023-10-16 MED ORDER — HYDROCODONE-ACETAMINOPHEN 7.5-325 MG PO TABS: 1.0000 | ORAL_TABLET | Freq: Two times a day (BID) | ORAL | 0 refills | Status: DC | PRN

## 2023-10-16 NOTE — Telephone Encounter (Signed)
Please inform the patient that a referral to Pain Management in [place] has been sent. I have also refilled her pain medication. The plan of care involves gradually tapering her prescription, starting with a reduction to 60 tablets. For the next refill, the patient will need to complete a urine drug screening and sign a pain management contract. Subsequent refills will be reduced to 30 tablets, then to 20 tablets, until establishes care with Pain Management

## 2023-10-16 NOTE — Telephone Encounter (Signed)
Pt. Stated she talked to Pain management and they refused referral due to the fact that she has arthritis, pt.  Stated that she was told that she has arthritis and it is incurable so they had no need to treat her. No appointment was made. Pt. Is asking for prescription to be refilled or for physician to Please send referral to another site. Pt. Stated she does not have transportation resources, and that if possible ,place referral in [place] or [place]. Pt. Stated that she  is refusing to drive to [facility] or [place] due to lack of transportation.  Pt. Was informed to keep her in office appt. On 10/23/23 for AWV and to discuss pain management further.

## 2023-10-17 NOTE — Telephone Encounter (Signed)
Mychart message with information sent.

## 2023-10-23 ENCOUNTER — Ambulatory Visit: Payer: 59

## 2023-10-23 VITALS — Ht 67.0 in | Wt 262.0 lb

## 2023-10-23 DIAGNOSIS — Z0000 Encounter for general adult medical examination without abnormal findings: Secondary | ICD-10-CM

## 2023-10-23 DIAGNOSIS — Z1231 Encounter for screening mammogram for malignant neoplasm of breast: Secondary | ICD-10-CM

## 2023-10-23 NOTE — Patient Instructions (Signed)
Ms. Dini , Thank you for taking time to come for your Medicare Wellness Visit. I appreciate your ongoing commitment to your health goals. Please review the following plan we discussed and let me know if I can assist you in the future.   Referrals/Orders/Follow-Ups/Clinician Recommendations: Aim for 30 minutes of exercise or brisk walking, 6-8 glasses of water, and 5 servings of fruits and vegetables each day.   This is a list of the screening recommended for you and due dates:  Health Maintenance  Topic Date Due   Eye exam for diabetics  Never done   Yearly kidney health urinalysis for diabetes  Never done   DTaP/Tdap/Td vaccine (1 - Tdap) Never done   Pap with HPV screening  Never done   Zoster (Shingles) Vaccine (1 of 2) Never done   Flu Shot  Never done   COVID-19 Vaccine (1 - 2023-24 season) Never done   Mammogram  09/19/2023   Hemoglobin A1C  03/12/2024   Complete foot exam   09/08/2024   Yearly kidney function blood test for diabetes  09/11/2024   Medicare Annual Wellness Visit  10/22/2024   Colon Cancer Screening  11/21/2030   Hepatitis C Screening  Completed   HIV Screening  Completed   HPV Vaccine  Aged Out    Advanced directives: (Provided) Advance directive discussed with you today. I have provided a copy for you to complete at home and have notarized. Once this is complete, please bring a copy in to our office so we can scan it into your chart. Information on Advanced Care Planning can be found at [facility] Advance Health Care Directives Advance Health Care Directives ([email or web address]    Next Medicare Annual Wellness Visit scheduled for next year: Yes insert Preventive Care Attachment Reference

## 2023-10-23 NOTE — Progress Notes (Signed)
Subjective:   Annette Bryant is a 62 y.o. female who presents for an Initial Medicare Annual Wellness Visit.  Visit Complete: Virtual I connected with  Cheri Kearns on 10/23/23 by a audio enabled telemedicine application and verified that I am speaking with the correct person using two identifiers.  Patient Location: Home  Provider Location: Home Office  I discussed the limitations of evaluation and management by telemedicine. The patient expressed understanding and agreed to proceed.  Vital Signs: Because this visit was a virtual/telehealth visit, some criteria may be missing or patient reported. Any vitals not documented were not able to be obtained and vitals that have been documented are patient reported.  Patient Medicare AWV questionnaire was completed by the patient on 10/23/2023; I have confirmed that all information answered by patient is correct and no changes since this date.  Cardiac Risk Factors include: advanced age (>12men, >11 women);dyslipidemia;hypertension     Objective:    Today's Vitals   10/23/23 1523  Weight: 262 lb (118.8 kg)  Height: 5\' 7"  (1.702 m)   Body mass index is 41.04 kg/m.     10/23/2023    3:27 PM 03/05/2022    9:33 AM 11/08/2021    1:05 PM 10/25/2021   11:33 AM 09/21/2021    6:19 AM 07/28/2021    8:12 PM 06/21/2021    9:08 AM  Advanced Directives  Does Patient Have a Medical Advance Directive? No No No No No No No  Would patient like information on creating a medical advance directive? Yes (MAU/Ambulatory/Procedural Areas - Information given) No - Patient declined No - Patient declined No - Patient declined No - Patient declined No - Patient declined     Current Medications (verified) Outpatient Encounter Medications as of 10/23/2023  Medication Sig   albuterol (PROVENTIL) (2.5 MG/3ML) 0.083% nebulizer solution Take 2.5 mg by nebulization every 6 (six) hours as needed for wheezing or shortness of breath.   ALPRAZolam (XANAX) 1 MG  tablet Take 1 tablet (1 mg total) by mouth 2 (two) times daily. (Patient taking differently: Take 1 mg by mouth 2 (two) times daily as needed for anxiety.)   aspirin EC 81 MG tablet Take 1 tablet (81 mg total) by mouth daily. Swallow whole.   budesonide-formoterol (SYMBICORT) 160-4.5 MCG/ACT inhaler Inhale 2 puffs into the lungs 2 (two) times daily.   cholecalciferol (VITAMIN D3) 25 MCG (1000 UNIT) tablet Take 1,000 Units by mouth daily.   dicyclomine (BENTYL) 20 MG tablet Take 1 tablet (20 mg total) by mouth 2 (two) times daily.   furosemide (LASIX) 40 MG tablet Take 40 mg by mouth daily as needed for fluid or edema.    HYDROcodone-acetaminophen (NORCO) 7.5-325 MG tablet Take 1 tablet by mouth 2 (two) times daily as needed for moderate pain (pain score 4-6).   KLOR-CON M20 20 MEQ tablet Take 20 mEq by mouth daily as needed (when taking furosemide).   metoprolol tartrate (LOPRESSOR) 25 MG tablet TAKE 1 TABLET 2 TIMES DAILY.   Multiple Vitamin (MULTIVITAMIN WITH MINERALS) TABS tablet Take 1 tablet by mouth daily.   nitroGLYCERIN (NITROSTAT) 0.4 MG SL tablet PLACE 1 TABLET UNDER THE TONGUE EVERY 5 (FIVE) MINUTES X 3 DOSES AS NEEDED FOR CHEST PAIN.   Polyethyl Glycol-Propyl Glycol (SYSTANE) 0.4-0.3 % SOLN Apply 1 drop to eye daily as needed (dry eyes).   Potassium Chloride ER 20 MEQ TBCR Take 1 tablet by mouth daily.   PROAIR HFA 108 (90 BASE) MCG/ACT inhaler Inhale 1-2  puffs into the lungs every 6 (six) hours as needed for wheezing or shortness of breath.   rosuvastatin (CRESTOR) 40 MG tablet TAKE 1 TABLET BY MOUTH EVERY DAY   tirzepatide (MOUNJARO) 12.5 MG/0.5ML Pen Inject 12.5 mg into the skin once a week.   No facility-administered encounter medications on file as of 10/23/2023.    Allergies (verified) Patient has no known allergies.   History: Past Medical History:  Diagnosis Date   Anxiety    Arthritis    Asthma    CAD (coronary artery disease)    a. 07/2021 NSTEMI/PCI: LM nl, LAD 42m  (3.0x28 Synergy XD DES), LCX large, 40ost/p, RCA large, 13m CTA (unable to wire). Dist RCA filla via R->R collats from RV branch.   Carpal tunnel syndrome    COPD (chronic obstructive pulmonary disease) (HCC)    Depression    Essential hypertension    HFrEF (heart failure with reduced ejection fraction) (HCC)    HLD (hyperlipidemia)    Hypertension    Ischemic cardiomyopathy    Morbid obesity (HCC)    OSA on CPAP    Panic attacks    Pre-diabetes    Past Surgical History:  Procedure Laterality Date   CESAREAN SECTION     CHOLECYSTECTOMY N/A 08/01/2017   Procedure: LAPAROSCOPIC CHOLECYSTECTOMY;  Surgeon: Manus Rudd, MD;  Location: [facility];  Service: General;  Laterality: N/A;   COLONOSCOPY WITH PROPOFOL N/A 11/21/2020   Procedure: COLONOSCOPY WITH PROPOFOL;  Surgeon: Corbin Ade, MD;  Location: [facility];  Service: Endoscopy;  Laterality: N/A;  12:00pm   CORONARY STENT INTERVENTION N/A 07/31/2021   Procedure: CORONARY STENT INTERVENTION;  Surgeon: Kathleene Hazel, MD;  Location: [facility];  Service: Cardiovascular;  Laterality: N/A;   LEFT HEART CATH AND CORONARY ANGIOGRAPHY N/A 07/31/2021   Procedure: LEFT HEART CATH AND CORONARY ANGIOGRAPHY;  Surgeon: Kathleene Hazel, MD;  Location: [facility];  Service: Cardiovascular;  Laterality: N/A;   TUBAL LIGATION     Family History  Problem Relation Age of Onset   Depression Mother    Alcohol abuse Father    Coronary artery disease Sister 68       stents   Depression Sister    Coronary artery disease Sister    Heart attack Sister 35       CHF, MI   Schizophrenia Brother    Alcohol abuse Brother    Colon cancer Neg Hx    Social History   Socioeconomic History   Marital status: Divorced    Spouse name: Not on file   Number of children: Not on file   Years of education: Not on file   Highest education level: Not on file  Occupational History   Not on file  Tobacco Use   Smoking status:  Never   Smokeless tobacco: Never  Vaping Use   Vaping status: Never Used  Substance and Sexual Activity   Alcohol use: No   Drug use: No   Sexual activity: Not Currently    Birth control/protection: Surgical  Other Topics Concern   Not on file  Social History Narrative   Not on file   Social Determinants of Health   Financial Resource Strain: Low Risk  (10/23/2023)   Overall Financial Resource Strain (CARDIA)    Difficulty of Paying Living Expenses: Not hard at all  Food Insecurity: No Food Insecurity (10/23/2023)   Hunger Vital Sign    Worried About Programme researcher, broadcasting/film/video in  the Last Year: Never true    Ran Out of Food in the Last Year: Never true  Transportation Needs: No Transportation Needs (10/23/2023)   PRAPARE - Administrator, Civil Service (Medical): No    Lack of Transportation (Non-Medical): No  Physical Activity: Insufficiently Active (10/23/2023)   Exercise Vital Sign    Days of Exercise per Week: 2 days    Minutes of Exercise per Session: 30 min  Stress: No Stress Concern Present (10/23/2023)   [employer] of Occupational Health - Occupational Stress Questionnaire    Feeling of Stress : Not at all  Social Connections: Moderately Isolated (10/23/2023)   Social Connection and Isolation Panel [NHANES]    Frequency of Communication with Friends and Family: More than three times a week    Frequency of Social Gatherings with Friends and Family: More than three times a week    Attends Religious Services: More than 4 times per year    Active Member of [employer] or Organizations: No    Attends Engineer, structural: Never    Marital Status: Divorced    Tobacco Counseling Counseling given: Not Answered   Clinical Intake:  Pre-visit preparation completed: Yes  Pain : No/denies pain     Nutritional Risks: None Diabetes: No  How often do you need to have someone help you when you read instructions, pamphlets, or other written materials  from your doctor or pharmacy?: 1 - Never  Interpreter Needed?: No  Information entered by :: Renie Ora, LPN   Activities of Daily Living    10/23/2023    3:27 PM  In your present state of health, do you have any difficulty performing the following activities:  Hearing? 0  Vision? 0  Difficulty concentrating or making decisions? 0  Walking or climbing stairs? 0  Dressing or bathing? 0  Doing errands, shopping? 0  Preparing Food and eating ? N  Using the Toilet? N  In the past six months, have you accidently leaked urine? N  Do you have problems with loss of bowel control? N  Managing your Medications? N  Managing your Finances? N  Housekeeping or managing your Housekeeping? N    Patient Care Team: Del Newman Nip, Tenna Child, FNP as PCP - General (Family Medicine) Jonelle Sidle, MD as PCP - Cardiology (Cardiology) Jena Gauss Gerrit Friends, MD as Consulting Physician (Gastroenterology)  Indicate any recent Medical Services you may have received from other than [facility] providers in the past year (date may be approximate).     Assessment:   This is a routine wellness examination for Tiyana.  Hearing/Vision screen Vision Screening - Comments:: Wears rx glasses - up to date with routine eye exams with  Dr.cotter   Goals Addressed             This Visit's Progress    DIET - EAT MORE FRUITS AND VEGETABLES         Depression Screen    10/23/2023    3:26 PM 09/09/2023    1:19 PM 05/08/2023    1:04 PM 04/17/2023    1:14 PM 02/05/2022    1:31 PM 11/08/2021    1:07 PM 03/30/2016   10:39 AM  PHQ 2/9 Scores  PHQ - 2 Score 0 2 1 2  0 1   PHQ- 9 Score 0 4 3 5  0 4      Information is confidential and restricted. Go to Review Flowsheets to unlock data.    Fall Risk  10/23/2023    3:24 PM 09/09/2023    1:19 PM 05/08/2023    1:04 PM 04/17/2023    1:14 PM 11/08/2021    1:06 PM  Fall Risk   Falls in the past year? 0 0 0 0 0  Number falls in past yr: 0 0 0 0 0  Injury with  Fall? 0 0 0 0 0  Risk for fall due to : No Fall Risks  No Fall Risks No Fall Risks No Fall Risks  Follow up Falls prevention discussed  Falls evaluation completed Falls evaluation completed Falls evaluation completed    MEDICARE RISK AT HOME: Medicare Risk at Home Any stairs in or around the home?: No If so, are there any without handrails?: No Home free of loose throw rugs in walkways, pet beds, electrical cords, etc?: Yes Adequate lighting in your home to reduce risk of falls?: Yes Life alert?: No Use of a cane, walker or w/c?: No Grab bars in the bathroom?: Yes Shower chair or bench in shower?: Yes Elevated toilet seat or a handicapped toilet?: No  TIMED UP AND GO:  Was the test performed? No    Cognitive Function:        10/23/2023    3:28 PM  6CIT Screen  What Year? 0 points  What month? 0 points  What time? 0 points  Count back from 20 0 points  Months in reverse 0 points  Repeat phrase 0 points  Total Score 0 points    Immunizations  There is no immunization history on file for this patient.  TDAP status: Due, Education has been provided regarding the importance of this vaccine. Advised may receive this vaccine at local pharmacy or Health Dept. Aware to provide a copy of the vaccination record if obtained from local pharmacy or Health Dept. Verbalized acceptance and understanding.  Flu Vaccine status: Due, Education has been provided regarding the importance of this vaccine. Advised may receive this vaccine at local pharmacy or Health Dept. Aware to provide a copy of the vaccination record if obtained from local pharmacy or Health Dept. Verbalized acceptance and understanding.  Pneumococcal vaccine status: Due, Education has been provided regarding the importance of this vaccine. Advised may receive this vaccine at local pharmacy or Health Dept. Aware to provide a copy of the vaccination record if obtained from local pharmacy or Health Dept. Verbalized acceptance  and understanding.  Covid-19 vaccine status: Declined, Education has been provided regarding the importance of this vaccine but patient still declined. Advised may receive this vaccine at local pharmacy or Health Dept.or vaccine clinic. Aware to provide a copy of the vaccination record if obtained from local pharmacy or Health Dept. Verbalized acceptance and understanding.  Qualifies for Shingles Vaccine? Yes   Zostavax completed No   Shingrix Completed?: No.    Education has been provided regarding the importance of this vaccine. Patient has been advised to call insurance company to determine out of pocket expense if they have not yet received this vaccine. Advised may also receive vaccine at local pharmacy or Health Dept. Verbalized acceptance and understanding.  Screening Tests Health Maintenance  Topic Date Due   OPHTHALMOLOGY EXAM  Never done   Diabetic kidney evaluation - Urine ACR  Never done   DTaP/Tdap/Td (1 - Tdap) Never done   Cervical Cancer Screening (HPV/Pap Cotest)  Never done   Zoster Vaccines- Shingrix (1 of 2) Never done   INFLUENZA VACCINE  Never done   COVID-19 Vaccine (1 - 2023-24  season) Never done   MAMMOGRAM  09/19/2023   HEMOGLOBIN A1C  03/12/2024   FOOT EXAM  09/08/2024   Diabetic kidney evaluation - eGFR measurement  09/11/2024   Medicare Annual Wellness (AWV)  10/22/2024   Colonoscopy  11/21/2030   Hepatitis C Screening  Completed   HIV Screening  Completed   HPV VACCINES  Aged Out    Health Maintenance  Health Maintenance Due  Topic Date Due   OPHTHALMOLOGY EXAM  Never done   Diabetic kidney evaluation - Urine ACR  Never done   DTaP/Tdap/Td (1 - Tdap) Never done   Cervical Cancer Screening (HPV/Pap Cotest)  Never done   Zoster Vaccines- Shingrix (1 of 2) Never done   INFLUENZA VACCINE  Never done   COVID-19 Vaccine (1 - 2023-24 season) Never done   MAMMOGRAM  09/19/2023    Colorectal cancer screening: Type of screening: Colonoscopy. Completed  11/21/2020. Repeat every 10 years  Mammogram status: Ordered 10/23/2023. Pt provided with contact info and advised to call to schedule appt.   Bone Density status: Ordered not of age . Pt provided with contact info and advised to call to schedule appt.  Lung Cancer Screening: (Low Dose CT Chest recommended if Age 41-80 years, 20 pack-year currently smoking OR have quit w/in 15years.) does not qualify.   Lung Cancer Screening Referral: n/a  Additional Screening:  Hepatitis C Screening: does not qualify; Completed 09/12/2023  Vision Screening: Recommended annual ophthalmology exams for early detection of glaucoma and other disorders of the eye. Is the patient up to date with their annual eye exam?  Yes  Who is the provider or what is the name of the office in which the patient attends annual eye exams? Dr.Cotter  If pt is not established with a provider, would they like to be referred to a provider to establish care? No .   Dental Screening: Recommended annual dental exams for proper oral hygiene   Community Resource Referral / Chronic Care Management: CRR required this visit?  No   CCM required this visit?  No     Plan:     I have personally reviewed and noted the following in the patient's chart:   Medical and social history Use of alcohol, tobacco or illicit drugs  Current medications and supplements including opioid prescriptions. Patient is not currently taking opioid prescriptions. Functional ability and status Nutritional status Physical activity Advanced directives List of other physicians Hospitalizations, surgeries, and ER visits in previous 12 months Vitals Screenings to include cognitive, depression, and falls Referrals and appointments  In addition, I have reviewed and discussed with patient certain preventive protocols, quality metrics, and best practice recommendations. A written personalized care plan for preventive services as well as general preventive  health recommendations were provided to patient.     Lorrene Reid, LPN   16/09/9603   After Visit Summary: (MyChart) Due to this being a telephonic visit, the after visit summary with patients personalized plan was offered to patient via MyChart   Nurse Notes: Due Tdap and Pneumonia  Vaccine

## 2023-10-28 ENCOUNTER — Other Ambulatory Visit: Payer: Self-pay | Admitting: Cardiology

## 2023-10-30 ENCOUNTER — Telehealth: Payer: Self-pay

## 2023-10-30 ENCOUNTER — Other Ambulatory Visit: Payer: Self-pay

## 2023-10-30 DIAGNOSIS — E119 Type 2 diabetes mellitus without complications: Secondary | ICD-10-CM

## 2023-10-30 MED ORDER — TIRZEPATIDE 12.5 MG/0.5ML ~~LOC~~ SOAJ: 12.5000 mg | SUBCUTANEOUS | 0 refills | Status: DC

## 2023-10-30 NOTE — Telephone Encounter (Signed)
Pt requesting a refill on mounjaro asked for medication to be sent to [facility] [address], rx sent.

## 2023-10-30 NOTE — Telephone Encounter (Signed)
refills  

## 2023-10-31 ENCOUNTER — Other Ambulatory Visit: Payer: Self-pay | Admitting: Family Medicine

## 2023-10-31 DIAGNOSIS — E119 Type 2 diabetes mellitus without complications: Secondary | ICD-10-CM

## 2023-10-31 NOTE — Telephone Encounter (Signed)
Received

## 2023-11-12 ENCOUNTER — Other Ambulatory Visit: Payer: Self-pay | Admitting: Family Medicine

## 2023-11-12 DIAGNOSIS — E119 Type 2 diabetes mellitus without complications: Secondary | ICD-10-CM

## 2023-11-13 ENCOUNTER — Other Ambulatory Visit: Payer: Self-pay | Admitting: Family Medicine

## 2023-11-13 ENCOUNTER — Telehealth: Payer: Self-pay

## 2023-11-13 MED ORDER — ALPRAZOLAM 1 MG PO TABS: 1.0000 mg | ORAL_TABLET | Freq: Two times a day (BID) | ORAL | 0 refills | Status: DC | PRN

## 2023-11-13 MED ORDER — TIRZEPATIDE 15 MG/0.5ML ~~LOC~~ SOAJ: 15.0000 mg | SUBCUTANEOUS | 0 refills | Status: DC

## 2023-11-13 NOTE — Telephone Encounter (Signed)
sent 

## 2023-11-13 NOTE — Telephone Encounter (Signed)
According to clinic protocols, I will approve a refill for xanax 1 mg for this cycle for 60 tablets; however, the next refill will initiate a taper, reducing the quantity to *30 tablets. As xanax carries a risk of dependence and habit-forming, this approach ensures compliance with safe prescribing practices and prioritizes patient safety for [facility] patients

## 2023-11-13 NOTE — Telephone Encounter (Signed)
Pt. Stated " she is not pleased with this. She is 62 yrs old and she has been on this medication for years w/ no issues. That she will be looking for a new provider. "

## 2023-11-13 NOTE — Telephone Encounter (Signed)
Copied from CRM [id number]. Topic: Clinical - Medication Refill >> Nov 12, 2023 11:37 AM Hendricks Limes wrote: Most Recent Primary Care Visit:  Provider: Lorrene Reid  Department: [facility]  Visit Type: MEDICARE AWV, INITIAL  Date: 10/23/2023  Medication: ALPRAZolam (XANAX) 1 MG tablet [[id number]]  Has the patient contacted their pharmacy? Yes (Agent: If no, request that the patient contact the pharmacy for the refill. If patient does not wish to contact the pharmacy document the reason why and proceed with request.) (Agent: If yes, when and what did the pharmacy advise?)  Is this the correct pharmacy for this prescription? Yes If no, delete pharmacy and type the correct one.  This is the patient's preferred pharmacy:  [id number] [place], [place] - [address] AT [facility] [address] [place] [address] Phone: [phone] Fax: [phone]    Has the prescription been filled recently? No  Is the patient out of the medication? Yes  Has the patient been seen for an appointment in the last year OR does the patient have an upcoming appointment? Yes  Can we respond through MyChart? No  Agent: Please be advised that Rx refills may take up to 3 business days. We ask that you follow-up with your pharmacy.

## 2023-11-14 ENCOUNTER — Telehealth: Payer: Self-pay | Admitting: Family Medicine

## 2023-11-14 NOTE — Telephone Encounter (Signed)
I sent this medication :)

## 2023-11-14 NOTE — Telephone Encounter (Signed)
Your note just had a " ." Was there something you needed me to complete for this pt. ?

## 2023-11-14 NOTE — Telephone Encounter (Signed)
Copied from CRM [id number]. Topic: Clinical - Medication Question >> Nov 14, 2023 11:29 AM Annette Bryant wrote: Reason for CRM: Patient has a lot of questions regarding her medications and treatment plan regarding her panic attack - she has already spoken with a nurse regarding the reduction of her medications - she claims the wrong medications have been sent to the pharmacy and some concerns about what the provider is doing with her medications. She is pretty upset and unsure why her xanax is being reduced - I recommend that the Provider call the patient at this point - she looking for another provider at this time.

## 2023-11-14 NOTE — Telephone Encounter (Signed)
If she doesn't understand her treatment plan she needs to schedule a face to face visit with [facility] for clarification

## 2023-11-15 ENCOUNTER — Other Ambulatory Visit: Payer: Self-pay | Admitting: Family Medicine

## 2023-11-15 ENCOUNTER — Telehealth: Payer: Self-pay | Admitting: Family Medicine

## 2023-11-15 MED ORDER — TIRZEPATIDE 15 MG/0.5ML ~~LOC~~ SOAJ: 15.0000 mg | SUBCUTANEOUS | 0 refills | Status: DC

## 2023-11-15 MED ORDER — ALPRAZOLAM 1 MG PO TABS: 1.0000 mg | ORAL_TABLET | Freq: Two times a day (BID) | ORAL | 0 refills | Status: AC | PRN

## 2023-11-15 NOTE — Telephone Encounter (Signed)
SENT 

## 2023-11-15 NOTE — Telephone Encounter (Signed)
Looks like the script printed so it may need to be resent

## 2023-11-15 NOTE — Telephone Encounter (Signed)
Pt Is needing [facility] [place]   ALPRAZolam Prudy Feeler) 1 MG tablet [[id number]]    Spoke with pharm and they have no refill request   Previous tele message in chart

## 2023-11-15 NOTE — Telephone Encounter (Signed)
I advised an appointment since after speaking with Sheena she was still not understanding why her meds were being reduced but pt refuses appt. Pls advise next steps

## 2023-11-15 NOTE — Telephone Encounter (Signed)
Got it.

## 2023-11-15 NOTE — Telephone Encounter (Signed)
I sent in 60 tablets

## 2023-11-15 NOTE — Telephone Encounter (Signed)
Last visit was 09/09/23 next visit should be 01/09/24  She should receive her medication not sure why pharmacy is denying this or she getting this medication somewhere else?

## 2023-11-15 NOTE — Telephone Encounter (Signed)
Looks like the last rx refill printed and did not transmit electronically. Please resend

## 2023-11-18 ENCOUNTER — Telehealth: Payer: Self-pay

## 2023-11-18 ENCOUNTER — Other Ambulatory Visit: Payer: Self-pay | Admitting: Family Medicine

## 2023-11-18 NOTE — Telephone Encounter (Signed)
Copied from CRM [id number]. Topic: Clinical - Prescription Issue >> Nov 15, 2023 11:27 AM Annette Bryant wrote: Reason for CRM: Patient call regarding her panic attack medication upset because lliana Orbe Polanco want to take the patient off the medication ALPRAZolam (XANAX) 1 MG tablet for panic attacks , Patient been on the medication for 8 years for panic attacks , patient unable to sleep and afraid that she going to have another panic attack if taken off the medications  patient stated she suppose to get 90 day supply. Let patient know that the provider will be sending back over be sure everything is correct and for [facility] to check [id number] [place], [place] [address] AT [facility]    pharmacy around 1:00 pm

## 2023-11-18 NOTE — Telephone Encounter (Signed)
Mychart message sent with Physician response

## 2023-11-18 NOTE — Telephone Encounter (Signed)
Please inform patient: I understand that this change in your medication plan may feel concerning, especially given your history with Xanax (alprazolam). My goal is to prioritize your safety while managing your anxiety effectively. Long-term use of high doses of Xanax, particularly in combination with oxycodone, can pose significant risks, including dependency and potential health complications.  To ensure your safety, we are tapering the dose gradually, which allows your body to adjust while minimizing withdrawal symptoms. During this process, we can explore safer and effective alternatives to support your anxiety and help with sleep

## 2023-11-20 NOTE — Telephone Encounter (Signed)
All medications have been received by pt. Please sign off on orders as I do not have authorization access. Thank you.

## 2023-11-20 NOTE — Telephone Encounter (Signed)
Pt. Has received medication she went to her second pharmacy and they were able to fill . Please sign off on this encounter as I do not have authorization. Thank you.

## 2023-11-25 ENCOUNTER — Ambulatory Visit (HOSPITAL_COMMUNITY)
Admission: RE | Admit: 2023-11-25 | Discharge: 2023-11-25 | Disposition: A | Discharge status: Home / Self Care | Payer: 59 | Source: Ambulatory Visit | Attending: Family Medicine | Admitting: Family Medicine

## 2023-11-25 DIAGNOSIS — Z1231 Encounter for screening mammogram for malignant neoplasm of breast: Secondary | ICD-10-CM | POA: Insufficient documentation

## 2023-12-02 ENCOUNTER — Ambulatory Visit: Payer: 59 | Attending: Nurse Practitioner | Admitting: Nurse Practitioner

## 2023-12-02 ENCOUNTER — Encounter: Payer: Self-pay | Admitting: Nurse Practitioner

## 2023-12-02 VITALS — BP 128/80 | HR 87 | Ht 67.0 in | Wt 258.2 lb

## 2023-12-02 DIAGNOSIS — E785 Hyperlipidemia, unspecified: Secondary | ICD-10-CM | POA: Diagnosis not present

## 2023-12-02 DIAGNOSIS — E119 Type 2 diabetes mellitus without complications: Secondary | ICD-10-CM

## 2023-12-02 DIAGNOSIS — I255 Ischemic cardiomyopathy: Secondary | ICD-10-CM

## 2023-12-02 DIAGNOSIS — I10 Essential (primary) hypertension: Secondary | ICD-10-CM | POA: Diagnosis not present

## 2023-12-02 DIAGNOSIS — I2510 Atherosclerotic heart disease of native coronary artery without angina pectoris: Secondary | ICD-10-CM | POA: Diagnosis not present

## 2023-12-02 DIAGNOSIS — I5022 Chronic systolic (congestive) heart failure: Secondary | ICD-10-CM

## 2023-12-02 DIAGNOSIS — G4733 Obstructive sleep apnea (adult) (pediatric): Secondary | ICD-10-CM

## 2023-12-02 NOTE — Patient Instructions (Signed)
Medication Instructions:  Your physician recommends that you continue on your current medications as directed. Please refer to the Current Medication list given to you today.  *If you need a refill on your cardiac medications before your next appointment, please call your pharmacy*   Lab Work: NONE   If you have labs (blood work) drawn today and your tests are completely normal, you will receive your results only by: MyChart Message (if you have MyChart) OR A paper copy in the mail If you have any lab test that is abnormal or we need to change your treatment, we will call you to review the results.   Testing/Procedures: NONE    Follow-Up: At [facility], you and your health needs are our priority.  As part of our continuing mission to provide you with exceptional heart care, we have created designated Provider Care Teams.  These Care Teams include your primary Cardiologist (physician) and Advanced Practice Providers (APPs -  Physician Assistants and Nurse Practitioners) who all work together to provide you with the care you need, when you need it.  We recommend signing up for the patient portal called "MyChart".  Sign up information is provided on this After Visit Summary.  MyChart is used to connect with patients for Virtual Visits (Telemedicine).  Patients are able to view lab/test results, encounter notes, upcoming appointments, etc.  Non-urgent messages can be sent to your provider as well.   To learn more about what you can do with MyChart, go to [email or web address].    Your next appointment:   6 month(s)  Provider:   You may see Samuel McDowell, MD or one of the following Advanced Practice Providers on your designated Care Team:   Brittany Strader, PA-C  Michele Lenze, PA-C     Other Instructions Thank you for choosing  [facility]!    

## 2023-12-02 NOTE — Progress Notes (Signed)
Office Visit    Patient Name: Annette Bryant Date of Encounter: 12/02/2023  Primary Care Provider:  Rica Records, FNP Primary Cardiologist:  Annette Dell, MD  Chief Complaint    62 y.o. female with a history of CAD status post late presenting MI in August 2022, ischemic cardiomyopathy, HFmrEF, diabetes, hypertension, sleep apnea on CPAP, obesity, anxiety, and depression, presents for CAD and heart failure follow-up.  Past Medical History  Subjective   Past Medical History:  Diagnosis Date   Anxiety    Arthritis    Asthma    CAD (coronary artery disease)    a. 07/2021 NSTEMI/PCI: LM nl, LAD 73m (3.0x28 Synergy XD DES), LCX large, 40ost/p, RCA large, 145m CTA (unable to wire). Dist RCA filla via R->R collats from RV branch.   Carpal tunnel syndrome    COPD (chronic obstructive pulmonary disease) (HCC)    Depression    Essential hypertension    Heart failure with mid-range ejection fraction (HCC)    a. 07/2021 Echo: EF 44%; b. 12/2021 Echo: EF of 45 to 50% with moderate LVH, normal RV size and function, and mild MR.   HLD (hyperlipidemia)    Hypertension    Ischemic cardiomyopathy    Ischemic cardiomyopathy    a. 07/2021 Echo: EF 44%; b. 12/2021 Echo: EF 45-50%.   Morbid obesity (HCC)    OSA on CPAP    Panic attacks    Pre-diabetes    Past Surgical History:  Procedure Laterality Date   CESAREAN SECTION     CHOLECYSTECTOMY N/A 08/01/2017   Procedure: LAPAROSCOPIC CHOLECYSTECTOMY;  Surgeon: Annette Rudd, MD;  Location: [facility];  Service: General;  Laterality: N/A;   COLONOSCOPY WITH PROPOFOL N/A 11/21/2020   Procedure: COLONOSCOPY WITH PROPOFOL;  Surgeon: Annette Ade, MD;  Location: [facility];  Service: Endoscopy;  Laterality: N/A;  12:00pm   CORONARY STENT INTERVENTION N/A 07/31/2021   Procedure: CORONARY STENT INTERVENTION;  Surgeon: Annette Hazel, MD;  Location: [facility];  Service: Cardiovascular;  Laterality: N/A;   LEFT HEART  CATH AND CORONARY ANGIOGRAPHY N/A 07/31/2021   Procedure: LEFT HEART CATH AND CORONARY ANGIOGRAPHY;  Surgeon: Annette Hazel, MD;  Location: [facility];  Service: Cardiovascular;  Laterality: N/A;   TUBAL LIGATION      Allergies  No Known Allergies    History of Present Illness      62 y.o. y/o female with a history of CAD, ischemic cardiomyopathy, HFmrEF, diabetes, hypertension, sleep apnea, obesity, anxiety, and depression.  In August 2022, she was hospitalized with a 2-day history of chest pain.  Troponin was markedly elevated at 17,000.  Diagnostic catheterization revealed a chronic total occlusion of the right coronary artery and severe mid LAD disease.  The LAD was successfully treated with a drug-eluting stent while the RCA was unable to be successfully wired.  Echocardiogram showed an EF of 44% with grade 1 diastolic dysfunction, severe basal-mid inferior akinesis, and mild MR.  Patient was managed with GDMT and follow-up echo in January 2023 showed EF of 45 to 50% with moderate LVH, normal RV size and function, and mild MR.  GDMT limited by symptomatic hypotension in the setting of Entresto and Jardiance therapy necessitating discontinuation.    [name] was last seen in cardiology clinic in January 2023, at which time she was doing well.  Over the past nearly 2 years, she notes that she has continued to do well.  She has been  exercising for 1 hour at [employer], 2 times per week, mostly doing aerobic activities.  She is able to do this without experiencing chest pain or dyspnea.  Further, she denies palpitations, PND, orthopnea, dizziness, syncope, edema, or early satiety.  She recently had lipids checked with an LDL of 77 despite rosuvastatin 40 mg daily.  She notes that she really likes meat lasagna and eats it fairly regularly.  She is interested in adjusting her diet and foregoing additional medication at this time. Objective  Home Medications    Current  Outpatient Medications  Medication Sig Dispense Refill   albuterol (PROVENTIL) (2.5 MG/3ML) 0.083% nebulizer solution Take 2.5 mg by nebulization every 6 (six) hours as needed for wheezing or shortness of breath.     ALPRAZolam (XANAX) 1 MG tablet Take 1 tablet (1 mg total) by mouth 2 (two) times daily as needed for anxiety. 60 tablet 0   aspirin EC 81 MG tablet Take 1 tablet (81 mg total) by mouth daily. Swallow whole. 90 tablet 3   budesonide-formoterol (SYMBICORT) 160-4.5 MCG/ACT inhaler Inhale 2 puffs into the lungs 2 (two) times daily.     cholecalciferol (VITAMIN D3) 25 MCG (1000 UNIT) tablet Take 1,000 Units by mouth daily.     furosemide (LASIX) 40 MG tablet Take 40 mg by mouth daily as needed for fluid or edema.      KLOR-CON M20 20 MEQ tablet Take 20 mEq by mouth daily as needed (when taking furosemide).     metoprolol tartrate (LOPRESSOR) 25 MG tablet TAKE 1 TABLET 2 TIMES DAILY. 15 tablet 0   Multiple Vitamin (MULTIVITAMIN WITH MINERALS) TABS tablet Take 1 tablet by mouth daily.     nitroGLYCERIN (NITROSTAT) 0.4 MG SL tablet PLACE 1 TABLET UNDER THE TONGUE EVERY 5 (FIVE) MINUTES X 3 DOSES AS NEEDED FOR CHEST PAIN. 25 tablet 3   Potassium Chloride ER 20 MEQ TBCR Take 1 tablet by mouth daily.     PROAIR HFA 108 (90 BASE) MCG/ACT inhaler Inhale 1-2 puffs into the lungs every 6 (six) hours as needed for wheezing or shortness of breath.     rosuvastatin (CRESTOR) 40 MG tablet TAKE 1 TABLET BY MOUTH EVERY DAY 30 tablet 0   tirzepatide (MOUNJARO) 15 MG/0.5ML Pen Inject 15 mg into the skin once a week. 6 mL 0   dicyclomine (BENTYL) 20 MG tablet Take 1 tablet (20 mg total) by mouth 2 (two) times daily. (Patient not taking: Reported on 12/02/2023) 20 tablet 0   HYDROcodone-acetaminophen (NORCO) 7.5-325 MG tablet Take 1 tablet by mouth 2 (two) times daily as needed for moderate pain (pain score 4-6). (Patient not taking: Reported on 12/02/2023) 60 tablet 0   Polyethyl Glycol-Propyl Glycol  (SYSTANE) 0.4-0.3 % SOLN Apply 1 drop to eye daily as needed (dry eyes). (Patient not taking: Reported on 12/02/2023)     No current facility-administered medications for this visit.     Physical Exam    VS:  BP 128/80   Pulse 87   Ht 5\' 7"  (1.702 m)   Wt 258 lb 3.2 oz (117.1 kg)   LMP 07/11/2017 (Exact Date)   SpO2 96%   BMI 40.44 kg/m  , BMI Body mass index is 40.44 kg/m.       GEN: Well nourished, well developed, in no acute distress. HEENT: normal. Neck: Supple, no JVD, carotid bruits, or masses. Cardiac: RRR, no murmurs, rubs, or gallops. No clubbing, cyanosis, edema.  Radials 2+/PT 2+ and equal bilaterally.  Respiratory:  Respirations regular and unlabored, clear to auscultation bilaterally. GI: Soft, nontender, nondistended, BS + x 4. MS: no deformity or atrophy. Skin: warm and dry, no rash. Neuro:  Strength and sensation are intact. Psych: Normal affect.  Accessory Clinical Findings    ECG personally reviewed by me today - EKG Interpretation Date/Time:  Monday December 02 2023 14:40:55 EST Ventricular Rate:  91 PR Interval:  178 QRS Duration:  88 QT Interval:  370 QTC Calculation: 455 R Axis:   69  Text Interpretation: Normal sinus rhythm Septal infarct (cited on or before 31-Jul-2021) Confirmed by Nicolasa Ducking [phone]) on 12/02/2023 2:50:10 PM  - no acute changes.  Lab Results  Component Value Date   WBC 7.7 09/12/2023   HGB 14.2 09/12/2023   HCT 44.7 09/12/2023   MCV 85 09/12/2023   PLT 256 09/12/2023   Lab Results  Component Value Date   CREATININE 1.03 (H) 09/12/2023   BUN 10 09/12/2023   NA 140 09/12/2023   K 4.3 09/12/2023   CL 102 09/12/2023   CO2 25 09/12/2023   Lab Results  Component Value Date   ALT 10 09/12/2023   AST 14 09/12/2023   ALKPHOS 131 (H) 09/12/2023   BILITOT 0.5 09/12/2023   Lab Results  Component Value Date   CHOL 130 09/12/2023   HDL 36 (L) 09/12/2023   LDLCALC 77 09/12/2023   TRIG 86 09/12/2023   CHOLHDL  3.6 09/12/2023    Lab Results  Component Value Date   HGBA1C 5.9 (H) 09/12/2023   Lab Results  Component Value Date   TSH 0.777 09/12/2023       Assessment & Plan    1.  Coronary artery disease: Status post non-STEMI in August 2022 with catheterization revealing a chronic total occlusion of the right coronary artery and severe mid LAD disease status post drug-eluting stent placement to the LAD.  She was last seen here in January 2023, and since then, she has done well.  She is exercising twice a week at [employer] and has lost 20 pounds since January 2024.  She does not experience chest pain, dyspnea, or limitations with activity.  I encouraged her to increase her weekly exercise to 3 times per week and also encouraged her to add in some amount of resistance training at least twice a week.  She was receptive to this idea.  She remains on aspirin, beta-blocker, and statin therapy.  2.  Chronic heart failure with midrange ejection fraction/ischemic cardiomyopathy: EF 45 to 50% by echo in January 2023.  She is euvolemic on examination and has been doing well as an outpatient.  She has been managed with metoprolol to tartrate since her MI in 2022.  Prior attempts to add Entresto or Jardiance resulted in hypotension requiring discontinuation.  Blood pressure stable today.  She is currently on tirzepatide with 20 pound weight loss over the past year as outlined above.    3.  Primary hypertension: Blood pressure stable on beta-blocker therapy.  Consider ARB in the future if pressures elevate.  4.  Hyperlipidemia: Recent lipids with a total cholesterol of 130, HDL 36, LDL of 77.  We discussed LDL goal of less than 70.  She is currently on rosuvastatin 40 mg daily and reports good compliance.  We discussed her diet and activity.  She prefers to manage lipids with further lifestyle modifications as opposed to adding ezetimibe at this time, which I think is very reasonable.  She will look to add 1  additional day of exercise to her week, and we discussed trying to limit sources of saturated fat and cholesterol such as she is getting through her regular consumption of meat lasagna.  5.  Type 2 diabetes mellitus: A1c 5.9 in October.  She is managed by primary care and on tirzepatide with 20 pound weight loss in the past year.  6.  Morbid obesity: Actively losing weight through exercise and tirzepatide therapy.  Encouraged her to modify diet further to increase intake of fruits and vegetables and reduce exogenous sources of saturated fat and cholesterol.  7.  Obstructive sleep apnea: Compliant with CPAP.  8.  Disposition: Follow-up in 6 months or sooner if necessary.  Nicolasa Ducking, NP 12/02/2023, 3:09 PM

## 2023-12-07 ENCOUNTER — Other Ambulatory Visit: Payer: Self-pay | Admitting: Cardiology

## 2023-12-10 ENCOUNTER — Other Ambulatory Visit: Payer: Self-pay | Admitting: Family Medicine

## 2023-12-10 DIAGNOSIS — E119 Type 2 diabetes mellitus without complications: Secondary | ICD-10-CM

## 2023-12-11 ENCOUNTER — Other Ambulatory Visit: Payer: Self-pay | Admitting: Cardiology

## 2023-12-12 ENCOUNTER — Other Ambulatory Visit: Payer: Self-pay | Admitting: Family Medicine

## 2023-12-12 NOTE — Telephone Encounter (Signed)
 I sent Mounjaro 12.5mg  -      6 ml which is good for 84 days on 11/15/23

## 2024-01-02 ENCOUNTER — Other Ambulatory Visit: Payer: Self-pay | Admitting: Family Medicine

## 2024-01-02 DIAGNOSIS — E119 Type 2 diabetes mellitus without complications: Secondary | ICD-10-CM

## 2024-01-09 ENCOUNTER — Ambulatory Visit: Payer: 59 | Admitting: Family Medicine

## 2024-02-09 ENCOUNTER — Other Ambulatory Visit: Payer: Self-pay | Admitting: Family Medicine

## 2024-02-09 DIAGNOSIS — E119 Type 2 diabetes mellitus without complications: Secondary | ICD-10-CM

## 2024-02-12 ENCOUNTER — Telehealth: Payer: Self-pay | Admitting: Cardiology

## 2024-02-12 MED ORDER — METOPROLOL TARTRATE 25 MG PO TABS: ORAL_TABLET | ORAL | 3 refills | Status: DC

## 2024-02-12 NOTE — Telephone Encounter (Signed)
*  STAT* If patient is at the pharmacy, call can be transferred to refill team.   1. Which medications need to be refilled? (please list name of each medication and dose if known) Metoprolol   2. Would you like to learn more about the convenience, safety, & potential cost savings by using the [facility]?     3. Are you open to using the [facility] (Type [facility].   4. Which pharmacy/location (including street and city if local pharmacy) is medication to be sent to? [facility]  [place]   5. Do they need a 30 day or 90 day supply? 90 days and refills- please call in today

## 2024-02-12 NOTE — Telephone Encounter (Signed)
Medication refill request completed.  

## 2024-03-13 ENCOUNTER — Other Ambulatory Visit: Payer: Self-pay | Admitting: Cardiology

## 2024-03-13 ENCOUNTER — Other Ambulatory Visit: Payer: Self-pay | Admitting: Family Medicine

## 2024-03-13 DIAGNOSIS — E119 Type 2 diabetes mellitus without complications: Secondary | ICD-10-CM

## 2024-04-09 ENCOUNTER — Other Ambulatory Visit: Payer: Self-pay | Admitting: Family Medicine

## 2024-04-09 DIAGNOSIS — E119 Type 2 diabetes mellitus without complications: Secondary | ICD-10-CM

## 2024-05-15 ENCOUNTER — Other Ambulatory Visit: Payer: Self-pay | Admitting: Cardiology

## 2024-05-15 ENCOUNTER — Other Ambulatory Visit: Payer: Self-pay | Admitting: Family Medicine

## 2024-05-15 DIAGNOSIS — E119 Type 2 diabetes mellitus without complications: Secondary | ICD-10-CM

## 2024-05-27 ENCOUNTER — Ambulatory Visit: Payer: 59 | Attending: Cardiology | Admitting: Cardiology

## 2024-05-27 ENCOUNTER — Encounter: Payer: Self-pay | Admitting: Cardiology

## 2024-05-27 VITALS — BP 126/80 | HR 80 | Ht 67.0 in | Wt 251.4 lb

## 2024-05-27 DIAGNOSIS — E782 Mixed hyperlipidemia: Secondary | ICD-10-CM

## 2024-05-27 DIAGNOSIS — I10 Essential (primary) hypertension: Secondary | ICD-10-CM

## 2024-05-27 DIAGNOSIS — I5022 Chronic systolic (congestive) heart failure: Secondary | ICD-10-CM | POA: Diagnosis not present

## 2024-05-27 DIAGNOSIS — I25119 Atherosclerotic heart disease of native coronary artery with unspecified angina pectoris: Secondary | ICD-10-CM

## 2024-05-27 NOTE — Progress Notes (Signed)
    Cardiology Office Note  Date: 05/27/2024   ID: Annette Bryant, DOB 04-16-1961, MRN [id number]  History of Present Illness: Annette Bryant is a 63 y.o. female last seen in December 2024 by Mr. Marvel Slicker NP, I reviewed his note.  Our last visit was in January 2023.  She is here for a routine visit.  Reports no angina or nitroglycerin  use.  Has typically been exercising at [facility], recently stopped with left knee pain requiring an injection.  She plans to go back.  Reports NYHA class II dyspnea, no palpitations or syncope.  We went over her medications.  She reports compliance with current regimen.  I reviewed her interval lab work.  We discussed getting a follow-up echocardiogram.  Physical Exam: VS:  BP 126/80   Pulse 80   Ht 5' 7 (1.702 m)   Wt 258 lb (117 kg)   LMP 07/11/2017 (Exact Date)   SpO2 97%   BMI 40.41 kg/m , BMI Body mass index is 40.41 kg/m.  Wt Readings from Last 3 Encounters:  05/27/24 258 lb (117 kg)  12/02/23 258 lb 3.2 oz (117.1 kg)  10/23/23 262 lb (118.8 kg)    General: Patient appears comfortable at rest. HEENT: Conjunctiva and lids normal. Neck: Supple, no elevated JVP or carotid bruits. Lungs: Clear to auscultation, nonlabored breathing at rest. Cardiac: Regular rate and rhythm, no S3 or significant systolic murmur, no pericardial rub. Extremities: No pitting edema.  ECG:  An ECG dated 12/02/2023 was personally reviewed today and demonstrated:  Normal sinus rhythm, anteroseptal Q waves.  Labwork: 09/12/2023: ALT 10; AST 14; BUN 10; Creatinine, Ser 1.03; Hemoglobin 14.2; Platelets 256; Potassium 4.3; Sodium 140; TSH 0.777     Component Value Date/Time   CHOL 130 09/12/2023 1417   TRIG 86 09/12/2023 1417   HDL 36 (L) 09/12/2023 1417   CHOLHDL 3.6 09/12/2023 1417   CHOLHDL 3.2 09/22/2021 1244   VLDL 21 09/22/2021 1244   LDLCALC 77 09/12/2023 1417   Other Studies Reviewed Today:  No interval cardiac testing for review today.  Assessment  and Plan:  1.  CAD status post DES to the LAD in 2022.  Also has occluded RCA with right to right collaterals managed medically.  Reports no active angina at this time.  Continue aspirin  81 mg daily, Lopressor  25 mg twice daily, Crestor  40 mg daily, and as needed nitroglycerin .  She is also on Mounjaro  and has lost about 30 pounds.  Discussed continuing regular exercise plan.  2.  HFmrEF with history of ischemic cardiomyopathy.  Echocardiogram in January 2023 revealed LVEF 45 to 50%.  We will update her echocardiogram.  Reports NYHA class II dyspnea and no fluid retention.  3.  Mixed hyperlipidemia.  LDL 77 in October 2024.  Continue Crestor  40 mg daily.  4.  Primary hypertension.  No changes made to current regimen.  Disposition:  Follow up 6 months.  Signed, Gerard Knight, M.D., F.A.C.C[facility]

## 2024-05-27 NOTE — Patient Instructions (Addendum)

## 2024-06-16 ENCOUNTER — Other Ambulatory Visit: Payer: Self-pay | Admitting: Family Medicine

## 2024-06-16 ENCOUNTER — Other Ambulatory Visit: Payer: Self-pay | Admitting: Cardiology

## 2024-06-16 DIAGNOSIS — E119 Type 2 diabetes mellitus without complications: Secondary | ICD-10-CM

## 2024-06-19 ENCOUNTER — Ambulatory Visit: Payer: Self-pay | Admitting: Cardiology

## 2024-06-19 ENCOUNTER — Ambulatory Visit (HOSPITAL_COMMUNITY)
Admission: RE | Admit: 2024-06-19 | Discharge: 2024-06-19 | Disposition: A | Discharge status: Home / Self Care | Source: Ambulatory Visit | Attending: Cardiology | Admitting: Cardiology

## 2024-06-19 DIAGNOSIS — I5022 Chronic systolic (congestive) heart failure: Secondary | ICD-10-CM | POA: Diagnosis present

## 2024-06-19 LAB — ECHOCARDIOGRAM COMPLETE
Area-P 1/2: 5.31 cm2
MV M vel: 2.77 m/s
MV Peak grad: 30.7 mmHg
S' Lateral: 3 cm

## 2024-06-19 NOTE — Progress Notes (Signed)
 Echocardiogram 2D Echocardiogram has been performed.  Annette Bryant, RDCS 06/19/2024, 9:57 AM

## 2024-07-03 ENCOUNTER — Other Ambulatory Visit: Payer: Self-pay | Admitting: Family Medicine

## 2024-07-03 DIAGNOSIS — E119 Type 2 diabetes mellitus without complications: Secondary | ICD-10-CM

## 2024-07-14 ENCOUNTER — Other Ambulatory Visit: Payer: Self-pay | Admitting: Family Medicine

## 2024-07-31 ENCOUNTER — Encounter: Payer: Self-pay | Admitting: Radiology

## 2024-07-31 ENCOUNTER — Other Ambulatory Visit: Payer: Self-pay | Admitting: Family Medicine

## 2024-07-31 DIAGNOSIS — E119 Type 2 diabetes mellitus without complications: Secondary | ICD-10-CM

## 2024-07-31 NOTE — Telephone Encounter (Signed)
 Patient has not been seen in clinic for 11 months

## 2024-10-12 ENCOUNTER — Encounter: Payer: Self-pay | Admitting: Radiology

## 2024-11-09 ENCOUNTER — Other Ambulatory Visit (HOSPITAL_COMMUNITY): Payer: Self-pay | Admitting: Internal Medicine

## 2024-11-09 DIAGNOSIS — Z1231 Encounter for screening mammogram for malignant neoplasm of breast: Secondary | ICD-10-CM

## 2024-11-25 ENCOUNTER — Ambulatory Visit (HOSPITAL_COMMUNITY)

## 2024-11-27 ENCOUNTER — Other Ambulatory Visit (HOSPITAL_COMMUNITY): Payer: Self-pay | Admitting: Gerontology

## 2024-11-27 ENCOUNTER — Ambulatory Visit (HOSPITAL_COMMUNITY)
Admission: RE | Admit: 2024-11-27 | Discharge: 2024-11-27 | Disposition: A | Discharge status: Home / Self Care | Source: Ambulatory Visit | Attending: Gerontology | Admitting: Gerontology

## 2024-11-27 DIAGNOSIS — R0602 Shortness of breath: Secondary | ICD-10-CM | POA: Diagnosis present

## 2024-12-14 ENCOUNTER — Encounter (HOSPITAL_COMMUNITY): Payer: Self-pay

## 2024-12-14 ENCOUNTER — Ambulatory Visit (HOSPITAL_COMMUNITY)
Admission: RE | Admit: 2024-12-14 | Discharge: 2024-12-14 | Disposition: A | Discharge status: Home / Self Care | Source: Ambulatory Visit | Attending: Internal Medicine | Admitting: Internal Medicine

## 2024-12-14 DIAGNOSIS — Z1231 Encounter for screening mammogram for malignant neoplasm of breast: Secondary | ICD-10-CM | POA: Insufficient documentation

## 2024-12-16 ENCOUNTER — Other Ambulatory Visit (HOSPITAL_COMMUNITY): Payer: Self-pay | Admitting: Internal Medicine

## 2024-12-16 DIAGNOSIS — R928 Other abnormal and inconclusive findings on diagnostic imaging of breast: Secondary | ICD-10-CM

## 2024-12-24 ENCOUNTER — Ambulatory Visit (HOSPITAL_COMMUNITY)
Admission: RE | Admit: 2024-12-24 | Discharge: 2024-12-24 | Disposition: A | Source: Ambulatory Visit | Attending: Internal Medicine | Admitting: Internal Medicine

## 2024-12-24 ENCOUNTER — Ambulatory Visit (HOSPITAL_COMMUNITY)
Admission: RE | Admit: 2024-12-24 | Discharge: 2024-12-24 | Disposition: A | Discharge status: Home / Self Care | Source: Ambulatory Visit | Attending: Internal Medicine | Admitting: Internal Medicine

## 2024-12-24 ENCOUNTER — Encounter (HOSPITAL_COMMUNITY): Payer: Self-pay

## 2024-12-24 DIAGNOSIS — R928 Other abnormal and inconclusive findings on diagnostic imaging of breast: Secondary | ICD-10-CM | POA: Insufficient documentation
